# Patient Record
Sex: Female | Born: 1975
Health system: Southern US, Community
[De-identification: ages and names within clinical notes are randomized; demographics above are authoritative.]

## PROBLEM LIST (undated history)

## (undated) DIAGNOSIS — E119 Type 2 diabetes mellitus without complications: Secondary | ICD-10-CM

## (undated) DIAGNOSIS — J45909 Unspecified asthma, uncomplicated: Secondary | ICD-10-CM

## (undated) DIAGNOSIS — K219 Gastro-esophageal reflux disease without esophagitis: Secondary | ICD-10-CM

## (undated) DIAGNOSIS — M1990 Unspecified osteoarthritis, unspecified site: Secondary | ICD-10-CM

## (undated) DIAGNOSIS — Z87442 Personal history of urinary calculi: Secondary | ICD-10-CM

## (undated) DIAGNOSIS — E7800 Pure hypercholesterolemia, unspecified: Secondary | ICD-10-CM

## (undated) DIAGNOSIS — E039 Hypothyroidism, unspecified: Secondary | ICD-10-CM

## (undated) HISTORY — PX: CHOLECYSTECTOMY: SHX55

## (undated) HISTORY — PX: WISDOM TOOTH EXTRACTION: SHX21

---

## 1998-11-26 ENCOUNTER — Encounter: Admission: RE | Admit: 1998-11-26 | Discharge: 1998-12-18 | Payer: Self-pay | Admitting: Family Medicine

## 1999-07-25 ENCOUNTER — Other Ambulatory Visit: Admission: RE | Admit: 1999-07-25 | Discharge: 1999-07-25 | Payer: Self-pay | Admitting: Internal Medicine

## 2000-08-24 ENCOUNTER — Other Ambulatory Visit: Admission: RE | Admit: 2000-08-24 | Discharge: 2000-08-24 | Payer: Self-pay | Admitting: Obstetrics and Gynecology

## 2000-09-07 ENCOUNTER — Encounter: Payer: Self-pay | Admitting: Obstetrics and Gynecology

## 2000-09-07 ENCOUNTER — Ambulatory Visit (HOSPITAL_COMMUNITY): Admission: RE | Admit: 2000-09-07 | Discharge: 2000-09-07 | Payer: Self-pay | Admitting: Obstetrics and Gynecology

## 2000-10-07 ENCOUNTER — Encounter: Payer: Self-pay | Admitting: Obstetrics and Gynecology

## 2000-10-07 ENCOUNTER — Ambulatory Visit (HOSPITAL_COMMUNITY): Admission: RE | Admit: 2000-10-07 | Discharge: 2000-10-07 | Payer: Self-pay | Admitting: *Deleted

## 2000-10-26 ENCOUNTER — Ambulatory Visit (HOSPITAL_COMMUNITY): Admission: RE | Admit: 2000-10-26 | Discharge: 2000-10-26 | Payer: Self-pay | Admitting: Obstetrics and Gynecology

## 2000-12-04 ENCOUNTER — Inpatient Hospital Stay (HOSPITAL_COMMUNITY): Admission: AD | Admit: 2000-12-04 | Discharge: 2000-12-04 | Payer: Self-pay | Admitting: Obstetrics and Gynecology

## 2001-01-30 ENCOUNTER — Inpatient Hospital Stay (HOSPITAL_COMMUNITY): Admission: AD | Admit: 2001-01-30 | Discharge: 2001-02-01 | Payer: Self-pay | Admitting: Obstetrics and Gynecology

## 2001-06-10 ENCOUNTER — Encounter (HOSPITAL_BASED_OUTPATIENT_CLINIC_OR_DEPARTMENT_OTHER): Payer: Self-pay | Admitting: General Surgery

## 2001-06-15 ENCOUNTER — Ambulatory Visit (HOSPITAL_COMMUNITY): Admission: RE | Admit: 2001-06-15 | Discharge: 2001-06-16 | Payer: Self-pay | Admitting: General Surgery

## 2001-06-15 ENCOUNTER — Encounter (HOSPITAL_BASED_OUTPATIENT_CLINIC_OR_DEPARTMENT_OTHER): Payer: Self-pay | Admitting: General Surgery

## 2001-06-15 ENCOUNTER — Encounter (INDEPENDENT_AMBULATORY_CARE_PROVIDER_SITE_OTHER): Payer: Self-pay | Admitting: *Deleted

## 2001-10-25 ENCOUNTER — Other Ambulatory Visit: Admission: RE | Admit: 2001-10-25 | Discharge: 2001-10-25 | Payer: Self-pay | Admitting: Obstetrics and Gynecology

## 2002-11-08 ENCOUNTER — Other Ambulatory Visit: Admission: RE | Admit: 2002-11-08 | Discharge: 2002-11-08 | Payer: Self-pay | Admitting: Obstetrics and Gynecology

## 2003-05-29 ENCOUNTER — Inpatient Hospital Stay (HOSPITAL_COMMUNITY): Admission: AD | Admit: 2003-05-29 | Discharge: 2003-05-29 | Payer: Self-pay | Admitting: Obstetrics and Gynecology

## 2003-08-04 ENCOUNTER — Inpatient Hospital Stay (HOSPITAL_COMMUNITY): Admission: AD | Admit: 2003-08-04 | Discharge: 2003-08-06 | Payer: Self-pay | Admitting: Obstetrics and Gynecology

## 2003-11-13 ENCOUNTER — Other Ambulatory Visit: Admission: RE | Admit: 2003-11-13 | Discharge: 2003-11-13 | Payer: Self-pay | Admitting: Obstetrics and Gynecology

## 2004-09-24 ENCOUNTER — Inpatient Hospital Stay (HOSPITAL_COMMUNITY): Admission: AD | Admit: 2004-09-24 | Discharge: 2004-09-24 | Payer: Self-pay | Admitting: Obstetrics and Gynecology

## 2005-03-14 ENCOUNTER — Other Ambulatory Visit: Admission: RE | Admit: 2005-03-14 | Discharge: 2005-03-14 | Payer: Self-pay | Admitting: Family Medicine

## 2006-03-09 ENCOUNTER — Inpatient Hospital Stay (HOSPITAL_COMMUNITY): Admission: AD | Admit: 2006-03-09 | Discharge: 2006-03-09 | Payer: Self-pay | Admitting: Obstetrics and Gynecology

## 2007-03-24 ENCOUNTER — Inpatient Hospital Stay (HOSPITAL_COMMUNITY): Admission: AD | Admit: 2007-03-24 | Discharge: 2007-03-24 | Payer: Self-pay | Admitting: Obstetrics and Gynecology

## 2007-06-08 ENCOUNTER — Inpatient Hospital Stay (HOSPITAL_COMMUNITY): Admission: RE | Admit: 2007-06-08 | Discharge: 2007-06-10 | Payer: Self-pay | Admitting: Obstetrics and Gynecology

## 2010-06-04 NOTE — Discharge Summary (Signed)
Joan Walker, Joan Walker              ACCOUNT NO.:  [id number]   MEDICAL RECORD NO.:  [id number]          PATIENT TYPE:  INP   LOCATION:  9136                          FACILITY:  [facility]   PHYSICIAN:  Huel Cote, M.D. DATE OF BIRTH:  1976/01/04   DATE OF ADMISSION:  06/08/2007  DATE OF DISCHARGE:  06/10/2007                               DISCHARGE SUMMARY   DISCHARGE DIAGNOSES:  1. Term pregnancy at 39 plus weeks, delivered.  2. Status post normal spontaneous vaginal delivery.  3. Group B strep positive, treated in labor.   DISCHARGE MEDICATIONS:  Z-Pak for a prolonged sinus infection and Colace  stool softener.  The patient declined any pain medications.   DISCHARGE FOLLOWUP:  The patient is to follow up in 6 weeks for her  routine postpartum exam.   HOSPITAL COURSE:  The patient is a 35 year old G5, P2-0-2-2, who was  admitted at 20 plus weeks' gestation for induction of labor given term  status and favorable cervix.  Prenatal care had been uneventful except  for positive group B strep in her urine.   PRENATAL LABS:  O negative, antibody negative, rubella immune, hepatitis  B surface antigen negative, HIV negative, GC negative, chlamydia  negative, group B strep positive, 1-hour glucola 157, 3-hour within  normal limits.  First trimester screen was normal.   PAST OBSTETRICAL HISTORY:  1. In 2003, she had a 7-pound 6-ounce infant at 38 weeks.  2. In 2005, she had a 5-pound 10-ounce infant at 38 weeks.  3. In 2007, she had a miscarriage.  4. In 2008, she had a miscarriage.   PAST GYN HISTORY:  None.   PAST SURGICAL HISTORY:  Cholecystectomy in 2003.   PAST MEDICAL HISTORY:  None.   ALLERGIES:  None.   On admission, she was afebrile with stable vital signs.  Fetal heart  rate was reactive.  Cervix was 50, 3, in a -2 station.  She had a  rupture of membranes performed after she had received her penicillin  group B strep prophylaxis and was on Pitocin.  She progressed well  and  reached complete dilation and pushed for the normal spontaneous vaginal  delivery of a vigorous female infant OP.  Apgars were 8 and 9.  Weight was  7 pounds 11 ounces.  Placenta was delivered spontaneously.  There was a  true knot in the cord.  Small first-degree laceration was hemostatic and  did not require repair.  She was then admitted for routine postpartum  care.  On postpartum day #1, she complained of an increase in her sinus  infection symptoms with an excess of a green discharge and face and head  pain.  She had been treated with penicillin on her labor; however, was  placed on a Z-Pak to cover more atypical community-  acquired organisms for her sinus infection.  She was given prescriptions  for Motrin and Percocet prior to discharge, however, had declined pain  medications during her stay.  Postpartum day#2, she was feeling quite  well and was only with some fatigue.  Sinus infection was improving on Z-  Pak,  and she was discharged to home on those medications.      Huel Cote, M.D.  Electronically Signed     KR/MEDQ  D:  06/10/2007  T:  06/10/2007  Job:  [id number]

## 2010-06-07 NOTE — Discharge Summary (Signed)
[facility]  Patient:    Joan [name], Joan [name] Visit Number: [id number] MRN: [id number]          Service Type: OBS Location: 910A 9138 01 Attending Physician:  Oliver Pila Dictated by:   Alvino Chapel, M.D. Admit Date:  01/30/2001 Discharge Date: 02/01/2001                             Discharge Summary  DISCHARGE DIAGNOSES:          1. Term pregnancy at 38+ weeks.                               2. Smoker.                               3. Status post normal spontaneous vaginal                                  delivery.  DISCHARGE MEDICATIONS:        1. Motrin 600 mg p.o. q.6h. p.r.n.                               2. Percocet 1-2 tablets p.o. q.4h. p.r.n.  DISCHARGE FOLLOW-UP:          The patient is to follow up in approximately six weeks for her routine postpartum examination.  PRENATAL LABS:                O-, antibody negative.  RPR nonreactive. Rubella immune.  Hepatitis B surface antigen negative.  HIV negative.  GC negative.  Chlamydia negative.  Triple screen normal.  GBS negative.  PAST OBSTETRICAL HISTORY:     None.  PAST GYNECOLOGIC HISTORY:     None.  PAST SURGICAL HISTORY:        None.  PAST MEDICAL HISTORY:         None.  FAMILY HISTORY:               None.  HOSPITAL COURSE:              The patient is a 35 year old G1, P0 at 38+ weeks who is admitted with a advanced cervical dilation to 8 cm and contractions every 4-5 min.  The pregnancy has essentially been uncomplicated, except for positive smoking (which the patient had kept down substantially during her pregnancy).  The patient was afebrile on admission.  Fetal heart rate was reactive with occasional variables.   The cervix was completely effaced 8 cm and a 0 station.  She had assisted rupture of membranes with clear fluid obtained. She reached complete dilation shortly thereafter.  Pushed well with a normal spontaneous vaginal delivery of a vigorous female  infant over a second-degree laceration.  Apgars were 8 and 9.  Weight was 7 pounds 6 ounces.  Placenta then delivered spontaneously with a three-vessel cord.  Second-degree laceration was repaired with 2-0 Vicryl. Estimated blood loss was 350 cc.  The cervix and rectum were intact.  On postpartum day #2 the patient was doing very well.  She was afebrile with stable vital signs.  She was working on breast feeding, and was felt stable for discharge home. Dictated by:  Alvino Chapel, M.D. Attending Physician:  Oliver Pila DD:  02/01/01 TD:  02/01/01 Job: [id number]

## 2010-06-07 NOTE — Op Note (Signed)
[place]  Patient:    Joan [name], Joan [name] Visit Number: [id number] MRN: [id number]          Service Type: DSU Location: 5700 5702 01 Attending Physician:  Sonda Primes Dictated by:   Mardene Celeste. Lurene Shadow, M.D. Proc. Date: 06/15/01 Admit Date:  06/15/2001                             Operative Report  PREOPERATIVE DIAGNOSIS:  Chronic calculus cholecystitis.  POSTOPERATIVE DIAGNOSIS:  Chronic calculus cholecystitis.  OPERATION PERFORMED:  Laparoscopic cholecystectomy with intraoperative cholangiogram.  SURGEON:  Luisa Hart L. Lurene Shadow, M.D.  ASSISTANT:  Marnee Spring. Wiliam Ke, M.D.  ANESTHESIA:  General.  INDICATIONS FOR PROCEDURE:  This patient is a 35 year old woman with a three month history of recurrent epigastric pain, nausea and vomiting, easily exacerbated by fatty meals, no history of chills, fever or jaundice.  She had an abdominal ultrasound which showed multiple gallstones.  She was brought to the operating room now after the risks and potential benefits of laparoscopic gallbladder surgery have been fully discussed and she gives consent.  DESCRIPTION OF PROCEDURE:  Following the induction of satisfactory general anesthesia with the patient positioned supinely, the abdomen was routinely prepped and draped to be included in a sterile operative field.  Open laparoscopy created at the umbilicus with insertion of a Hasson cannula and insufflation of the peritoneal cavity to 14 mHg pressure using carbon dioxide. Visual exploration of the abdomen showed the gallbladder to be chronically scarred.  The liver edge was sharp.  Liver surface was smooth.  Some tethering of the duodenum up to the gallbladder.  None of the small or large intestine viewed appeared to be abnormal.  Pelvic organs were not visualized.  Under direct vision, epigastric and lateral ports were placed.  The gallbladder was then grasped and retracted cephalad.  Dissection  carried down in the region of the ampulla with isolation of the cystic artery and cystic duct.  The cystic artery traced to its entry into the gallbladder wall.  It was then doubly clipped and transected.  The cystic duct had multiple stones in it and these were milked out and then the cystic duct was clipped and opened.  Several more stones were milked out of the cystic duct.  I inserted a Reddick catheter into the abdomen and inserted it into the cystic duct and through it injected one half strength Hypaque and fluoroscoped the biliary system.  There was some spasm in the distal common bile duct at the sphincter. I gave the patient some glucagon and this relaxed the sphincter and contrast flowed freely into the duodenum. I did not see any filling defects within the common bile duct, common hepatic duct or upper hepatic radicals.  The common bile duct seemed to be very mildly dilated.  The Reddick catheter was then removed from the cystic duct.  The cystic duct was triply clipped and then transected.  I then dissected the gallbladder from the liver bed using electrocautery and maintaining hemostasis throughout the entire course of the dissection.  At the end of dissection, all areas were checked within the liver bed for bleeding.  Additional bleeding points were treated with electrocautery.  The camera was then placed in the epigastric port.  I retrieved the gallbladder through the umbilical port without difficulty. Sponge, instrument and sharp counts were verified.  The pneumoperitoneum allowed to deflate and the wounds closed in layers as  follows.  The epigastric and lateral flank wounds closed with 4-0 Dexon, the umbilical wound closed in two layers with 0 Dexon and 4-0 Dexon.  All wounds were then reinforced with Steri-Strips and sterile dressings were applied.  Anesthetic reversed. Patient removed from the operating room to the recovery room in stable condition having tolerated the  procedure well. Dictated by:   Mardene Celeste. Lurene Shadow, M.D. Attending Physician:  Sonda Primes DD:  06/15/01 TD:  06/15/01 Job: [id number]

## 2010-06-07 NOTE — Discharge Summary (Signed)
[name], [name]                        ACCOUNT NO.:  [id number]   MEDICAL RECORD NO.:  [id number]                   PATIENT TYPE:  INP   LOCATION:  9106                                 FACILITY:  [facility]   PHYSICIAN:  Malachi Pro. Ambrose Mantle, M.D.              DATE OF BIRTH:  [birth year]   DATE OF ADMISSION:  08/04/2003  DATE OF DISCHARGE:  08/06/2003                                 DISCHARGE SUMMARY   This 35 year old white married female, para 1-0-0-1, gravida 2, last period  November 16, 2002, [facility] August 24, 2003, by dates, but August 17, 2003, by  ultrasound, admitted with premature rupture of membranes. Blood group and  type O negative with a negative antibody, nonreactive serology, rubella  immune. Hepatitis B surface antigen negative. HIV declined. GC and Chlamydia  negative. Triple screen negative. One-hour Glucola 90. Group B strep  negative. Vagina ultrasound on January 19, 2003, crown rump length 3.1 cm,  10 weeks 0 days. [facility] August 17, 2003.  Urine infection with E. coli treated  with Macrobid on February 10, 2003. Repeat ultrasound on March 15, 2003,  revealed average gestational age 110 weeks 5 days, [facility] August 18, 2003.  The  patient stopped smoking on Jun 13, 2003, at 3;15 p.m.  On August 04, 2003, the  patient began leaking clear fluid. Exam in the office showed a positive  fern, cervix was 2 to 2+ cm, 40%, vertex, at a -2 to -3.   PAST MEDICAL HISTORY:  1. No known allergies.  2. Cholecystectomy in 2003.  3. No illnesses.   ALCOHOL, TOBACCO, AND DRUGS:  The patient stopped smoking after the onset of  pregnancy. She smoked less than one pack per day until she quit during the  pregnancy.   FAMILY HISTORY:  There is no significant family history in first-degree  relatives.   OBSTETRIC HISTORY:  In January 2003 a 7 pound 6 ounce female delivered  vaginally.   PHYSICAL EXAMINATION:  VITAL SIGNS: Normal.  HEART/LUNGS: Normal.  ABDOMEN: Soft. Fundal height 37 cm. Fetal heart  tones normal. Cervix 2 cm,  40%, fern positive, clear fluid, vertex at a -2 to -3 station.   Pitocin was begun and at 8 p.m. fetal heart rate decelerated. At  approximately 8:40 p.m. Pitocin was stopped, contractions ceased so Pitocin  was begun again at 2 milliunits a minute. By 9:10 p.m. the cervix was 2-3  cm, 50%, fetal scalp electrode applied. By 11:30 p.m. the cervix was 3+ cm,  60%, and I was called to see the patient for decelerations. At 12:30 a.m.  Pitocin was at 6 milliunits a minute with occasional deep variable  decelerations. Pitocin was diminished to 5 milliunits a minute and  decelerations improved. The cervix was 4+ cm and 70%. The patient declined  an epidural. She made good progress to an anterior lip. She could not  control her pushing urge and pushed uncontrollably  to deliver spontaneously  a second-degree left mediolateral laceration of a living female infant, 5  pounds 10 ounces with Apgars of 9 at 1 minute and 9 at 5 minutes. There was  a true knot in the cord, placenta was intact, uterus normal, rectum  negative. Left mediolateral laceration repaired with 3-0 Vicryl. Blood loss  about 400 cc. Postpartum the patient did well and was discharged on the  first postpartum day. The RhoGAM workup is in progress. Initial hemoglobin  12.3, hematocrit 36.7, white count 13,900, platelet count 305,000. Follow-up  hemoglobin 11.3. RPR was nonreactive.   FINAL DIAGNOSES:  1. Intrauterine pregnancy at 39 weeks, delivered, vertex.  2. Premature rupture of membranes.  3. True knot in the umbilical cord.   OPERATION:  Spontaneous delivery, vertex; repair of left mediolateral  laceration.   FINAL CONDITION:  Improved.   INSTRUCTIONS:  Regular discharge instruction booklet. The patient is advised  to return to the office in six weeks for follow-up examination. She declines  analgesics as discharge.                                               Malachi Pro. Ambrose Mantle, M.D.     TFH/MEDQ  D:  08/06/2003  T:  08/07/2003  Job:  [id number]

## 2010-10-14 LAB — RH IMMUNE GLOBULIN WORKUP (NOT WOMEN'S HOSP)
ABO/RH(D): O NEG
Antibody Screen: NEGATIVE

## 2010-10-16 LAB — CBC
HCT: 34 — ABNORMAL LOW
HCT: 35 — ABNORMAL LOW
Hemoglobin: 12
MCV: 88.6
Platelets: 228
Platelets: 240
RDW: 14.7
RDW: 15.3

## 2010-10-16 LAB — RH IMMUNE GLOB WKUP(>/=20WKS)(NOT WOMEN'S HOSP): Fetal Screen: POSITIVE

## 2010-10-16 LAB — KLEIHAUER-BETKE STAIN: Quantitation Fetal Hemoglobin: 0

## 2011-09-26 ENCOUNTER — Other Ambulatory Visit: Payer: Self-pay | Admitting: Family Medicine

## 2011-09-26 ENCOUNTER — Ambulatory Visit
Admission: RE | Admit: 2011-09-26 | Discharge: 2011-09-26 | Disposition: A | Discharge status: Home / Self Care | Payer: 59 | Source: Ambulatory Visit | Attending: Family Medicine | Admitting: Family Medicine

## 2011-09-26 DIAGNOSIS — R0600 Dyspnea, unspecified: Secondary | ICD-10-CM

## 2014-09-11 ENCOUNTER — Other Ambulatory Visit: Payer: Self-pay | Admitting: Physician Assistant

## 2014-09-11 ENCOUNTER — Other Ambulatory Visit (HOSPITAL_COMMUNITY)
Admission: RE | Admit: 2014-09-11 | Discharge: 2014-09-11 | Disposition: A | Discharge status: Home / Self Care | Payer: BLUE CROSS/BLUE SHIELD | Source: Ambulatory Visit | Attending: Family Medicine | Admitting: Family Medicine

## 2014-09-11 DIAGNOSIS — Z124 Encounter for screening for malignant neoplasm of cervix: Secondary | ICD-10-CM | POA: Insufficient documentation

## 2014-09-13 LAB — CYTOLOGY - PAP

## 2015-10-14 ENCOUNTER — Ambulatory Visit (HOSPITAL_COMMUNITY)
Admission: EM | Admit: 2015-10-14 | Discharge: 2015-10-14 | Disposition: A | Discharge status: Home / Self Care | Payer: Managed Care, Other (non HMO) | Attending: Internal Medicine | Admitting: Internal Medicine

## 2015-10-14 ENCOUNTER — Encounter (HOSPITAL_COMMUNITY): Payer: Self-pay | Admitting: *Deleted

## 2015-10-14 DIAGNOSIS — H6692 Otitis media, unspecified, left ear: Secondary | ICD-10-CM | POA: Diagnosis not present

## 2015-10-14 DIAGNOSIS — J019 Acute sinusitis, unspecified: Secondary | ICD-10-CM

## 2015-10-14 MED ORDER — PREDNISONE 50 MG PO TABS: 50.0000 mg | ORAL_TABLET | Freq: Every day | ORAL | 0 refills | Status: DC

## 2015-10-14 MED ORDER — AMOXICILLIN-POT CLAVULANATE 875-125 MG PO TABS: 1.0000 | ORAL_TABLET | Freq: Two times a day (BID) | ORAL | 0 refills | Status: AC

## 2015-10-14 NOTE — Discharge Instructions (Addendum)
Recheck or followup with primary care provider for further evaluation if symptoms persist.

## 2015-10-14 NOTE — ED Triage Notes (Signed)
C/O hoarse voice, productive cough since yesterday.  Had HA yesterday (now resolved).  Denies fevers.

## 2015-10-16 NOTE — ED Provider Notes (Signed)
[facility]    CSN: [id number] Arrival date & time: 10/14/15  [id number]     History   Chief Complaint Chief Complaint  Patient presents with  . Cough    HPI Jamison NeighborCheryl L Walker is a 40 y.o. female. Presents today with 2d hx hoarseness, sinus congestion/post nasal drainage.  Prod cough.  Runny nose.  Sore/scratchy throat, esp with talking.  Some nausea, no vomiting, no diarrhea.  Runny/congested nose x 1 month, before getting sicker 2d ago.  No fever.  Teaches, also works at a daycare.    HPI  History reviewed. No pertinent past medical history.  Past Surgical History:  Procedure Laterality Date  . CHOLECYSTECTOMY       Home Medications    Prior to Admission medications   Medication Sig Start Date End Date Taking? Authorizing Provider  levothyroxine (SYNTHROID, LEVOTHROID) 200 MCG tablet Take 200 mcg by mouth daily before breakfast.   Yes Historical Provider, MD    Family History No family history on file.  Social History Social History  Substance Use Topics  . Smoking status: Never Smoker  . Smokeless tobacco: Never Used  . Alcohol use No     Allergies   Review of patient's allergies indicates no known allergies.   Review of Systems Review of Systems  All other systems reviewed and are negative.    Physical Exam Triage Vital Signs ED Triage Vitals  Enc Vitals Group     BP 10/14/15 1955 121/85     Pulse Rate 10/14/15 1955 74     Resp 10/14/15 1955 16     Temp 10/14/15 1955 97.6 F (36.4 C)     Temp Source 10/14/15 1955 Oral     SpO2 10/14/15 1955 100 %     Weight --      Height --      Pain Score 10/14/15 2000 0   Updated Vital Signs BP 121/85   Pulse 74   Temp 97.6 F (36.4 C) (Oral)   Resp 16   LMP 09/16/2015 (Approximate)   SpO2 100%  Physical Exam  Constitutional: She is oriented to person, place, and time. No distress.  Alert, nicely groomed  HENT:  Head: Atraumatic.  Frequent coughing.  Voice is hoarse, also sounds quite  congested B TMs dull, no erythema on R; L erythematous Marked nasal congestion bilaterally Throat a little red  Eyes:  Conjugate gaze, no eye redness/drainage  Neck: Neck supple.  Cardiovascular: Normal rate and regular rhythm.   Pulmonary/Chest: No respiratory distress. She has no wheezes. She has no rales.  Lungs clear, symmetric breath sounds  Abdominal: She exhibits no distension.  Musculoskeletal: Normal range of motion.  Puffy/brawny 3-4+ symmetric edema B LEs, chronic per patient  Neurological: She is alert and oriented to person, place, and time.  Skin: Skin is warm and dry.  No cyanosis  Nursing note and vitals reviewed.    UC Treatments / Results   Procedures Procedures (including critical care time)      None today  Final Clinical Impressions(s) / UC Diagnoses   Final diagnoses:  Acute ear infection, left  Acute sinusitis with symptoms > 10 days   Recheck or followup with primary care provider for further evaluation if symptoms persist.    New Prescriptions Discharge Medication List as of 10/14/2015  8:23 PM    START taking these medications   Details  amoxicillin-clavulanate (AUGMENTIN) 875-125 MG tablet Take 1 tablet by mouth 2 (two) times daily., Starting Sun  10/14/2015, Until Wed 10/24/2015, Normal    predniSONE (DELTASONE) 50 MG tablet Take 1 tablet (50 mg total) by mouth daily., Starting Sun 10/14/2015, Normal         Joan Moore, MD 10/16/15 [phone]

## 2016-04-21 ENCOUNTER — Other Ambulatory Visit: Payer: Self-pay | Admitting: Family Medicine

## 2017-06-02 ENCOUNTER — Encounter: Payer: Self-pay | Admitting: Endocrinology

## 2017-06-02 ENCOUNTER — Ambulatory Visit (INDEPENDENT_AMBULATORY_CARE_PROVIDER_SITE_OTHER): Payer: Managed Care, Other (non HMO) | Admitting: Endocrinology

## 2017-06-02 VITALS — BP 144/88 | HR 88 | Ht 67.5 in | Wt 360.0 lb

## 2017-06-02 DIAGNOSIS — E039 Hypothyroidism, unspecified: Secondary | ICD-10-CM | POA: Insufficient documentation

## 2017-06-02 DIAGNOSIS — R5383 Other fatigue: Secondary | ICD-10-CM | POA: Diagnosis not present

## 2017-06-02 NOTE — Progress Notes (Signed)
Patient ID: Joan Walker, female   DOB: [birth year], 42 y.o.   MRN: [id number]           Referring Physician: Lupe Carney  Reason for Appointment:  Hypothyroidism, new visit    History of Present Illness:   Hypothyroidism was first diagnosed in ?  2015  At the time of diagnosis patient was getting a routine lab test for her annual exam but not having any symptoms of  fatigue, cold sensitivity, difficulty concentrating, dry skin, weight gain or hair loss.  Details of her prior treatments are not available except results pending 2017 when her thyroid supplement was that 150 mcg daily Subsequently her doses had been progressively increased and apparently in 2018 she was taking 200 mcg daily but no labs from 2018 are available  Although she was feeling relatively good in 04/2017 because of her TSH being 5.8 her dose was increased by 50 mcg  With increasing thyroid supplementation the does not still feel any different recently  She says that at times she will feel tired and periodically will have a day when she will feel tired and sleepy Also she does not have as much motivation to do do things or interact with people but does not think she is depressed  No change in her chronic dry skin She does have mild heat intolerance She has no change in her mild hair loss, not having any areas of alopecia She has had a gradual progressive weight gain over the last few years and is now at her maximum Probably has gained about 16 pounds over the last 2 years         Patient's weight history is as follows:  Wt Readings from Last 3 Encounters:  06/02/17 (!) 360 lb (163.3 kg)    Thyroid function results have been as follows:  Date   free T4   TSH   T3   04/02/2015   11.38   06/12/2015  6.02    08/10/2015    9.56    10/22/2015     4.99   04/23/2017   5.76      No results found for: TSH, FREET4, T3FREE   History reviewed. No pertinent past medical history.  Past Surgical History:    Procedure Laterality Date  . CHOLECYSTECTOMY      History reviewed. No pertinent family history.  Social History:  reports that she has never smoked. She has never used smokeless tobacco. She reports that she does not drink alcohol or use drugs.  Allergies: No Known Allergies  Allergies as of 06/02/2017   No Known Allergies     Medication List        Accurate as of 06/02/17  4:26 PM. Always use your most recent med list.          levothyroxine 200 MCG tablet Commonly known as:  SYNTHROID, LEVOTHROID Take 250 mcg by mouth daily before breakfast.          Review of Systems  Constitutional: Positive for weight gain.  HENT: Negative for hoarseness and trouble swallowing.   Respiratory: Positive for shortness of breath.   Cardiovascular: Positive for leg swelling.       She has had swelling of her legs and may be uncomfortable swelling right above her ankles.  Has not tolerated diuretics previously  Gastrointestinal: Negative for constipation.  Endocrine: Positive for heat intolerance. Negative for menstrual changes.       She has normal and regular menstrual cycles  Genitourinary: Positive for nocturia.  Musculoskeletal: Positive for joint pain. Negative for back pain.  Skin: Positive for dry skin.  Neurological: Negative for numbness.  Psychiatric/Behavioral: Negative for depressed mood and insomnia.                Examination:    BP (!) 144/88 (BP Location: Left Arm, Patient Position: Sitting, Cuff Size: Large)   Pulse 88   Ht 5' 7.5" (1.715 m)   Wt (!) 360 lb (163.3 kg)   SpO2 98%   BMI 55.55 kg/m   GENERAL:  Large build.   She has mild generalized swelling of her face with plethora especially of her cheeks  No buffalo hump, supraclavicular fat pads  No pallor, clubbing, lymphadenopathy in the neck  Skin:  no rash or pigmentation. No thinning of the skin, no ecchymosis No purple striae  EYES:  No prominence of the eyes or swelling of the  eyelids  ENT: Oral mucosa and tongue normal.  THYROID:  Not palpable.  HEART:  Normal  S1 and S2; no murmur or click.  CHEST:    Lungs: Vescicular breath sounds heard equally.  No crepitations/ wheeze.  ABDOMEN:  No distention.  Liver and spleen not palpable.  No other mass or tenderness.  NEUROLOGICAL: Reflexes are bilaterally normal at biceps.  JOINTS:  Normal.  EXTREMITIES: She has nonpitting swelling of her lower legs with significant amount of swelling just above the ankle with large fold of skin and stasis pigmentation   Assessment:  HYPOTHYROIDISM, diagnosed about 4 to 5 years ago and with no specific symptoms at baseline Baseline TSH not available for review  Although she has had a gradual increase in her levothyroxine dose since the diagnosis based on her lab work she normally does not have any subjective change in how she feels with dosage changes  For some time she is complaining of intermittent fatigue and lack of motivation which may or may not be related to her thyroid dysfunction No known history of depression but symptoms are suggestive of mild depression  With recent increase in her dose by 50 mcg her symptoms have not changed making it uncertain that she has these symptoms from hypothyroidism No objective findings on exam and no goiter found However discussed with the patient that occasionally with hypothyroidism especially with a low normal free T3 level patient may do subjectively better with the combination of liothyronine and levothyroxine  OBESITY: She has marked generalized obesity and has had a very gradual weight gain No clinical features of Cushing's disease on history or exam except for plethora of her face which is likely nonspecific  ?  Lymphedema of lower legs   PLAN:   Check free T4, free T3 and TSH levels today and decide on dosage regimen  Follow-up in 6 weeks   Reather Littler 06/02/2017, 4:26 PM   Consultation note copy sent to the  PCP  Note: This office note was prepared with Dragon voice recognition system technology. Any transcriptional errors that result from this process are unintentional.

## 2017-06-03 ENCOUNTER — Other Ambulatory Visit: Payer: Self-pay

## 2017-06-03 LAB — T4, FREE: FREE T4: 0.69 ng/dL (ref 0.60–1.60)

## 2017-06-03 LAB — TSH: TSH: 7.71 u[IU]/mL — AB (ref 0.35–4.50)

## 2017-06-03 LAB — T3, FREE: T3, Free: 4.1 pg/mL (ref 2.3–4.2)

## 2017-06-03 MED ORDER — LEVOTHYROXINE SODIUM 125 MCG PO TABS: 300.0000 ug | ORAL_TABLET | Freq: Every day | ORAL | 3 refills | Status: DC

## 2017-06-03 MED ORDER — LEVOTHYROXINE SODIUM 150 MCG PO TABS: ORAL_TABLET | ORAL | 3 refills | Status: DC

## 2017-06-03 MED ORDER — LEVOTHYROXINE SODIUM 150 MCG PO TABS: 300.0000 ug | ORAL_TABLET | Freq: Every day | ORAL | 3 refills | Status: DC

## 2017-06-04 ENCOUNTER — Other Ambulatory Visit: Payer: Self-pay

## 2017-07-16 ENCOUNTER — Encounter: Payer: Managed Care, Other (non HMO) | Attending: Family Medicine | Admitting: Skilled Nursing Facility1

## 2017-07-22 ENCOUNTER — Other Ambulatory Visit (INDEPENDENT_AMBULATORY_CARE_PROVIDER_SITE_OTHER): Payer: Managed Care, Other (non HMO)

## 2017-07-22 DIAGNOSIS — E039 Hypothyroidism, unspecified: Secondary | ICD-10-CM

## 2017-07-22 LAB — T4, FREE: Free T4: 1.03 ng/dL (ref 0.60–1.60)

## 2017-07-22 LAB — TSH: TSH: 3.81 u[IU]/mL (ref 0.35–4.50)

## 2017-07-22 LAB — T3, FREE: T3, Free: 4 pg/mL (ref 2.3–4.2)

## 2017-07-27 ENCOUNTER — Encounter: Payer: Self-pay | Admitting: Endocrinology

## 2017-07-27 ENCOUNTER — Ambulatory Visit (INDEPENDENT_AMBULATORY_CARE_PROVIDER_SITE_OTHER): Payer: Managed Care, Other (non HMO) | Admitting: Endocrinology

## 2017-07-27 VITALS — BP 136/88 | HR 86 | Ht 67.5 in | Wt 358.4 lb

## 2017-07-27 DIAGNOSIS — E039 Hypothyroidism, unspecified: Secondary | ICD-10-CM

## 2017-07-27 NOTE — Progress Notes (Signed)
Patient ID: Joan Walker, female   DOB: 01/25/1975, 42 y.o.   MRN: [id number]           Referring Physician: Lupe Carneyean Mitchell  Reason for Appointment:  Hypothyroidism, follow-up visit    History of Present Illness:   Hypothyroidism was first diagnosed in ?  2015  At the time of diagnosis patient was getting a routine lab test for her annual exam but not having any symptoms of  fatigue, cold sensitivity, difficulty concentrating, dry skin, weight gain or hair loss.  Details of her prior treatments are not available except results pending 2017 when her thyroid supplement was that 150 mcg daily Subsequently her doses had been progressively increased and apparently in 2018 she was taking 200 mcg daily but no labs from 2018 are available Other labs indicate persistently high TSH levels since 2017  RECENT history: TSH on her initial consultation was 7.7 when she was taking 250 mcg daily Although initially when she was seen she was complaining that times she will feel tired and periodically with increasing her dosage to 300 mcg levothyroxine she does not feel less tired and has better days more often  She is taking the supplement between 4-6 AM without any food when she gets up to go to the bathroom  She has had a gradual progressive weight gain over the last few years and this is now leveled off and she has lost 2 pounds         Patient's weight history is as follows:  Wt Readings from Last 3 Encounters:  07/27/17 (!) 358 lb 6.4 oz (162.6 kg)  06/02/17 (!) 360 lb (163.3 kg)    Thyroid function results have been as follows:  Date   free T4   TSH   T3   04/02/2015   11.38   06/12/2015  6.02    08/10/2015    9.56    10/22/2015     4.99   04/23/2017   5.76      Lab Results  Component Value Date   TSH 3.81 07/22/2017   TSH 7.71 (H) 06/02/2017   FREET4 1.03 07/22/2017   FREET4 0.69 06/02/2017   T3FREE 4.0 07/22/2017   T3FREE 4.1 06/02/2017     No past medical history on  file.  Past Surgical History:  Procedure Laterality Date  . CHOLECYSTECTOMY      No family history on file.  Social History:  reports that she has never smoked. She has never used smokeless tobacco. She reports that she does not drink alcohol or use drugs.  Allergies: No Known Allergies  Allergies as of 07/27/2017   No Known Allergies     Medication List        Accurate as of 07/27/17  3:34 PM. Always use your most recent med list.          levothyroxine 150 MCG tablet Commonly known as:  SYNTHROID, LEVOTHROID Take 2 tablets (300mcg) by mouth daily before breakfast, on an empty stomach.          Review of Systems  Cardiovascular: Positive for leg swelling.       Chronic nonpitting lower leg edema present                Examination:    BP 136/88 (BP Location: Left Arm, Patient Position: Sitting, Cuff Size: Large)   Pulse 86   Ht 5' 7.5" (1.715 m)   Wt (!) 358 lb 6.4 oz (162.6 kg)   SpO2  96%   BMI 55.30 kg/m      Assessment:  HYPOTHYROIDISM, diagnosed about 4 to 5 years ago and with no specific symptoms at baseline  Although she is requiring 300 mcg daily and progressively needing higher doses her TSH is now finally back to normal She subjectively feels better also She is very regular with taking a supplement as directed early morning   PLAN:   Continue 300 mcg daily of levothyroxine  Follow-up in 12 weeks   Joan Walker 07/27/2017, 3:34 PM    Note: This office note was prepared with Dragon voice recognition system technology. Any transcriptional errors that result from this process are unintentional.

## 2017-08-11 ENCOUNTER — Encounter: Payer: Self-pay | Admitting: Skilled Nursing Facility1

## 2017-08-11 ENCOUNTER — Encounter: Payer: Managed Care, Other (non HMO) | Attending: Family Medicine | Admitting: Skilled Nursing Facility1

## 2017-08-11 DIAGNOSIS — Z6841 Body Mass Index (BMI) 40.0 and over, adult: Secondary | ICD-10-CM | POA: Insufficient documentation

## 2017-08-11 DIAGNOSIS — Z713 Dietary counseling and surveillance: Secondary | ICD-10-CM | POA: Insufficient documentation

## 2017-08-11 DIAGNOSIS — E669 Obesity, unspecified: Secondary | ICD-10-CM | POA: Diagnosis present

## 2017-08-11 NOTE — Progress Notes (Signed)
Assessment:  Primary concerns today: obesity.   Pt arrived stating she doubts the dietitian can tell her anything she does not already know but we'll see. Pt states she has gained weight due to school and having a baby and no motivation. Pt states she has been big all of her life stating in high school her weight was 218 pounds. Pt states her first goal is to be 222 pounds.  Pt state she has 3 children and works full time working at a day care for [employer]. Pt states because she does not sit on her rear end all day she should not weigh this much. Pt states she urinates every 2-3 hours at night. Pt states she dribbles all the time needing panty liners. Pt states she has a bowel moment every day. Pt states every time she eats at [employer] she has lose stools. Pt states she wakes between 6-7:30am.  Pt states she always snacks for 2-3 days before her menses and 2-3 days after. Pt states she marinates her steak with butter. Pt states she finishes her childrens plates. Pt states she cooks all of her vegetables with butter. Pt states she typically eats in front of the television or with her phone.  Pt consistently changed the subject due to feeling uncomfortable with the subject. Pt was tearful throughout the appointment, at the end stating wow I do need a therapist my daughter came out as a pan sexual I love her but cannot support her being with a female, my son uses anger to get out of responsibility and my 50 year old son plays video games all day and my husband is supportive but also plays video games all day. 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 Pt states she already has a lot to think about so she will think about everything she learned today and then decide wether she is ready to work on changing her lifestyle habits and may see a therapist before starting that process.   MEDICATIONS: See List   DIETARY INTAKE:  Usual eating pattern includes 3 meals and 1 snacks per day.  Everyday  foods include none stated.  Avoided foods include peaches, mango, papaya, fish.    24-hr recall: eating out 5 days a week, 3 days a Week fried food B ( AM): fast food egg and cheese mcmuffin or sausge muffin or breakfast burrito (egg, sausage, cheese)----cereal  Snk ( AM): L ( PM): fish sticks or hamburger or chicken nuggets with green beans or broccoli with apple or other fruit----leftovers from dinner Snk ( PM): cheese and crackers  D ( PM): chicken fried or enchiladas or spinach and artichoke dip or pasta or sandwiches Snk ( PM):  Beverages: 96+oz water, juice   Usual physical activity: ADL's  Estimated energy needs: 1500 calories 170 g carbohydrates 112 g protein 42 g fat  Progress Towards Goal(s):  In progress.   Nutritional Diagnosis:  [place]-3.3 Overweight/obesity As related to overconsumption of calcories.  As evidenced by 24 hr recall: usse of butter in all cooking/fried foods multiple tiems a week and BMI 56.45.    Intervention:  Nutrition cousneling. Dietitian educated the pt on motivation to make changes. Goals: -Get with a urologist  -Find a mental health professional to work with -Eat every 3-5 hours: balanced meals: carbohydrate, protein, and vegetables  -Eat non starchy vegetables with every lunch and dinner 7 days a week -Work on awareness with the use of the should I eat flowsheet Teaching  Method Utilized:  Visual Auditory Hands on  Handouts given during visit include:  mental health professionals   -Should I eat  Barriers to learning/adherence to lifestyle change: emotional eating  Demonstrated degree of understanding via:  Teach Back   Monitoring/Evaluation:  Dietary intake, exercise, and body weight prn.

## 2017-10-22 ENCOUNTER — Other Ambulatory Visit (INDEPENDENT_AMBULATORY_CARE_PROVIDER_SITE_OTHER): Payer: Managed Care, Other (non HMO)

## 2017-10-22 ENCOUNTER — Other Ambulatory Visit: Payer: Managed Care, Other (non HMO)

## 2017-10-22 DIAGNOSIS — E039 Hypothyroidism, unspecified: Secondary | ICD-10-CM | POA: Diagnosis not present

## 2017-10-22 LAB — T4, FREE: FREE T4: 1.03 ng/dL (ref 0.60–1.60)

## 2017-10-22 LAB — TSH: TSH: 2.88 u[IU]/mL (ref 0.35–4.50)

## 2017-10-25 NOTE — Progress Notes (Deleted)
Patient ID: Joan Walker, female   DOB: 15-Nov-1975, 42 y.o.   MRN: [id number]           Referring Physician: Lupe Walker  Reason for Appointment:  Hypothyroidism, follow-up visit    History of Present Illness:   Hypothyroidism was first diagnosed in ?  2015  At the time of diagnosis patient was getting a routine lab test for her annual exam but not having any symptoms of  fatigue, cold sensitivity, difficulty concentrating, dry skin, weight gain or hair loss.  Details of her prior treatments are not available except results pending 2017 when her thyroid supplement was that 150 mcg daily Subsequently her doses had been progressively increased and apparently in 2018 she was taking 200 mcg daily but no labs from 2018 are available Other labs indicate persistently high TSH levels since 2017  RECENT history: TSH on her initial consultation was 7.7 when she was taking 250 mcg daily Although initially when she was seen she was complaining that times she will feel tired and periodically with increasing her dosage to 300 mcg levothyroxine she does not feel less tired and has better days more often  She is taking the supplement between 4-6 AM without any food when she gets up to go to the bathroom  She has had a gradual progressive weight gain over the last few years and this is now leveled off and she has lost 2 pounds         Patient's weight history is as follows:  Wt Readings from Last 3 Encounters:  08/11/17 (!) 360 lb 6.4 oz (163.5 kg)  07/27/17 (!) 358 lb 6.4 oz (162.6 kg)  06/02/17 (!) 360 lb (163.3 kg)    Thyroid function results have been as follows:  Date   free T4   TSH   T3   04/02/2015   11.38   06/12/2015  6.02    08/10/2015    9.56    10/22/2015     4.99   04/23/2017   5.76      Lab Results  Component Value Date   TSH 2.88 10/22/2017   TSH 3.81 07/22/2017   TSH 7.71 (H) 06/02/2017   FREET4 1.03 10/22/2017   FREET4 1.03 07/22/2017   FREET4 0.69 06/02/2017   T3FREE 4.0 07/22/2017   T3FREE 4.1 06/02/2017     No past medical history on file.  Past Surgical History:  Procedure Laterality Date  . CHOLECYSTECTOMY      No family history on file.  Social History:  reports that she has never smoked. She has never used smokeless tobacco. She reports that she does not drink alcohol or use drugs.  Allergies: No Known Allergies  Allergies as of 10/26/2017   No Known Allergies     Medication List        Accurate as of 10/25/17  9:39 PM. Always use your most recent med list.          levothyroxine 150 MCG tablet Commonly known as:  SYNTHROID, LEVOTHROID Take 2 tablets ( ) by mouth daily before breakfast, on an empty stomach.          Review of Systems              Examination:    There were no vitals taken for this visit.     Assessment:  HYPOTHYROIDISM, diagnosed about 4 to 5 years ago and with no specific symptoms at baseline  Although she is requiring 300 mcg daily and  progressively needing higher doses her TSH is now finally back to normal She subjectively feels better also She is very regular with taking a supplement as directed early morning   PLAN:   Continue 300 mcg daily of levothyroxine  Follow-up in 12 weeks   Joan Walker 10/25/2017, 9:39 PM    Note: This office note was prepared with Dragon voice recognition system technology. Any transcriptional errors that result from this process are unintentional.

## 2017-10-26 ENCOUNTER — Ambulatory Visit: Payer: Managed Care, Other (non HMO) | Admitting: Endocrinology

## 2017-10-26 DIAGNOSIS — Z0289 Encounter for other administrative examinations: Secondary | ICD-10-CM

## 2017-10-28 ENCOUNTER — Encounter: Payer: Self-pay | Admitting: Endocrinology

## 2017-10-28 ENCOUNTER — Ambulatory Visit (INDEPENDENT_AMBULATORY_CARE_PROVIDER_SITE_OTHER): Payer: Managed Care, Other (non HMO) | Admitting: Endocrinology

## 2017-10-28 ENCOUNTER — Ambulatory Visit: Payer: Managed Care, Other (non HMO) | Admitting: Endocrinology

## 2017-10-28 VITALS — BP 128/82 | HR 80 | Ht 67.0 in | Wt 354.0 lb

## 2017-10-28 DIAGNOSIS — E039 Hypothyroidism, unspecified: Secondary | ICD-10-CM | POA: Diagnosis not present

## 2017-10-28 MED ORDER — LEVOTHYROXINE SODIUM 175 MCG PO TABS: ORAL_TABLET | ORAL | 2 refills | Status: DC

## 2017-10-28 NOTE — Progress Notes (Signed)
Patient ID: Joan Walker, female   DOB: [birth year], 42 y.o.   MRN: [id number]           Referring Physician: Lupe Carney  Reason for Appointment:  Hypothyroidism, follow-up visit    History of Present Illness:   Hypothyroidism was first diagnosed in ?  2015  At the time of diagnosis patient was getting a routine lab test for her annual exam but not having any symptoms of  fatigue, cold sensitivity, difficulty concentrating, dry skin, weight gain or hair loss.  Details of her prior treatments are not available except results pending 2017 when her thyroid supplement was that 150 mcg daily Subsequently her doses had been progressively increased and apparently in 2018 she was taking 200 mcg daily but no labs from 2018 are available Other labs indicate persistently high TSH levels since 2017  RECENT history: TSH on her initial consultation 5/19 was 7.7 when she was taking 250 mcg daily At that time she was complaining of feeling tired periodically  After increasing her dosage to 300 mcg levothyroxine she does not feel as tired Again since her last visit in July she has generally felt fairly good He does have mild hair loss which is not new Has had slight improvement in weight recently  She is taking the supplement between 4-6 AM without any food when she gets up to go to the bathroom          Patient's weight history is as follows:  Wt Readings from Last 3 Encounters:  10/28/17 (!) 354 lb (160.6 kg)  08/11/17 (!) 360 lb 6.4 oz (163.5 kg)  07/27/17 (!) 358 lb 6.4 oz (162.6 kg)    Thyroid function results have been as follows:    Lab Results  Component Value Date   TSH 2.88 10/22/2017   TSH 3.81 07/22/2017   TSH 7.71 (H) 06/02/2017   FREET4 1.03 10/22/2017   FREET4 1.03 07/22/2017   FREET4 0.69 06/02/2017   T3FREE 4.0 07/22/2017   T3FREE 4.1 06/02/2017   Date   free T4   TSH   T3   04/02/2015   11.38   06/12/2015  6.02    08/10/2015    9.56    10/22/2015     4.99     04/23/2017   5.76    No past medical history on file.  Past Surgical History:  Procedure Laterality Date  . CHOLECYSTECTOMY      No family history on file.  Social History:  reports that she has never smoked. She has never used smokeless tobacco. She reports that she does not drink alcohol or use drugs.  Allergies: No Known Allergies  Allergies as of 10/28/2017   No Known Allergies     Medication List        Accurate as of 10/28/17 11:02 AM. Always use your most recent med list.          levothyroxine 150 MCG tablet Commonly known as:  SYNTHROID, LEVOTHROID Take 2 tablets ( ) by mouth daily before breakfast, on an empty stomach.          Review of Systems  Cardiovascular: Positive for leg swelling.       Chronic nonpitting lower leg edema about the same, does not think it is as firm in the lower leg                Examination:    BP 128/82   Pulse 80   Ht 5\' 7"  (1.702  m)   Wt (!) 354 lb (160.6 kg)   SpO2 98%   BMI 55.44 kg/m   No thyroid enlargement present   Assessment:  HYPOTHYROIDISM, diagnosed in 2014 also and with no specific symptoms at baseline  Her hypothyroidism appears to be well controlled with supplementing using 300 mcg daily She is quite compliant with this in the mornings Also her TSH is now consistent 2 times in a row With this she has had less fatigue and is able to control her weight better   PLAN:   Continue 300 mcg daily of levothyroxine Patient wants to use of her supply of 125 mcg at home and will give her a temporary supply of 175 also Subsequently she can go to a 300 mcg tablet once daily  Follow-up in 6 months   Kyleena Scheirer 10/28/2017, 11:02 AM    Note: This office note was prepared with Insurance underwriter. Any transcriptional errors that result from this process are unintentional.

## 2018-02-02 ENCOUNTER — Other Ambulatory Visit: Payer: Self-pay | Admitting: Endocrinology

## 2018-02-27 ENCOUNTER — Other Ambulatory Visit: Payer: Self-pay | Admitting: Endocrinology

## 2018-02-27 MED ORDER — LEVOTHYROXINE SODIUM 300 MCG PO TABS: 300.0000 ug | ORAL_TABLET | Freq: Every day | ORAL | 2 refills | Status: DC

## 2018-02-28 DIAGNOSIS — J029 Acute pharyngitis, unspecified: Secondary | ICD-10-CM | POA: Diagnosis not present

## 2018-02-28 DIAGNOSIS — R6889 Other general symptoms and signs: Secondary | ICD-10-CM | POA: Diagnosis not present

## 2018-02-28 DIAGNOSIS — R509 Fever, unspecified: Secondary | ICD-10-CM | POA: Diagnosis not present

## 2018-03-02 ENCOUNTER — Telehealth: Payer: Self-pay | Admitting: Endocrinology

## 2018-03-02 NOTE — Telephone Encounter (Signed)
LMTCB and schedule follow up appointment with Dr Lucianne MussKumar for her Thyroid with labs prior to visit  - per Dr Lucianne MussKumar request

## 2018-03-15 DIAGNOSIS — R0600 Dyspnea, unspecified: Secondary | ICD-10-CM | POA: Diagnosis not present

## 2018-03-15 DIAGNOSIS — M545 Low back pain: Secondary | ICD-10-CM | POA: Diagnosis not present

## 2018-03-15 DIAGNOSIS — N3000 Acute cystitis without hematuria: Secondary | ICD-10-CM | POA: Diagnosis not present

## 2018-04-06 ENCOUNTER — Other Ambulatory Visit: Payer: Self-pay

## 2018-04-06 ENCOUNTER — Other Ambulatory Visit (INDEPENDENT_AMBULATORY_CARE_PROVIDER_SITE_OTHER): Payer: BLUE CROSS/BLUE SHIELD

## 2018-04-06 DIAGNOSIS — E039 Hypothyroidism, unspecified: Secondary | ICD-10-CM | POA: Diagnosis not present

## 2018-04-06 LAB — TSH: TSH: 2.37 u[IU]/mL (ref 0.35–4.50)

## 2018-04-06 LAB — T4, FREE: Free T4: 1.3 ng/dL (ref 0.60–1.60)

## 2018-04-12 ENCOUNTER — Telehealth: Payer: Self-pay | Admitting: Endocrinology

## 2018-04-12 NOTE — Telephone Encounter (Signed)
Webex is still not working properly and cannot be scheduled at this time.

## 2018-04-12 NOTE — Telephone Encounter (Signed)
Patient stated she wasn't sure if she really needed to come in on Friday for an appt where she would just be told "her thyroid is normal"  She wanted to see if there was a phone option and I informed patient of Telehealth that would be live at the end of this week. She asked if she could do both or if Dr Lucianne Muss really even needed to speak or see her  Please advise

## 2018-04-12 NOTE — Telephone Encounter (Signed)
Please advise 

## 2018-04-12 NOTE — Telephone Encounter (Signed)
Please schedule virtual visit if she can do WebEx as this can be a 2 way conversation

## 2018-04-13 ENCOUNTER — Telehealth: Payer: BLUE CROSS/BLUE SHIELD | Admitting: Endocrinology

## 2018-04-13 ENCOUNTER — Ambulatory Visit: Payer: BLUE CROSS/BLUE SHIELD | Admitting: Endocrinology

## 2018-04-15 ENCOUNTER — Encounter: Payer: Self-pay | Admitting: Endocrinology

## 2018-04-15 ENCOUNTER — Other Ambulatory Visit: Payer: Self-pay

## 2018-04-15 ENCOUNTER — Ambulatory Visit (INDEPENDENT_AMBULATORY_CARE_PROVIDER_SITE_OTHER): Payer: BLUE CROSS/BLUE SHIELD | Admitting: Endocrinology

## 2018-04-15 DIAGNOSIS — E039 Hypothyroidism, unspecified: Secondary | ICD-10-CM

## 2018-04-15 MED ORDER — LEVOTHYROXINE SODIUM 300 MCG PO TABS: 300.0000 ug | ORAL_TABLET | Freq: Every day | ORAL | 2 refills | Status: DC

## 2018-04-15 NOTE — Progress Notes (Addendum)
Patient ID: Joan Walker, female   DOB: 01/09/1976, 43 y.o.   MRN: [id number]           Referring Physician: Lupe Carney  Reason for Appointment:  Hypothyroidism, follow-up visit   Today's office visit was provided via telemedicine using video technique Explained to the patient and the the limitations of evaluation and management by telemedicine and the availability of in person appointments.  The patient understood the limitations and agreed to proceed. Patient also understood that the telehealth visit is billable. . Location of the patient: Home . Location of the provider: Office Only the patient and myself were participating in the encounter      History of Present Illness:   Hypothyroidism was first diagnosed in ?  2015  At the time of diagnosis patient was getting a routine lab test for her annual exam but not having any symptoms of  fatigue, cold sensitivity, difficulty concentrating, dry skin, weight gain or hair loss.  Details of her prior treatments are not available except results pending 2017 when her thyroid supplement was that 150 mcg daily Subsequently her doses had been progressively increased and apparently in 2018 she was taking 200 mcg daily but no labs from 2018 are available Other labs indicate persistently high TSH levels since 2017  RECENT history: TSH on her initial consultation 5/19 was 7.7 when she was taking 250 mcg daily At that time she was complaining of feeling tired periodically  Since increasing her dosage to 300 mcg levothyroxine she does not feel as tired  She says her energy level is generally fairly good now She does not know if she has gained any weight recently No cold intolerance Has had long-term mild hair loss which is basically shedding in the shower only Does not feel she has any change in her swelling of the fingers or legs  She is taking the levothyroxine supplement between 4-6 AM without any food and no new calcium or iron  supplements  Her TSH has been consistently normal since 7/19         Patient's weight history is as follows:  Wt Readings from Last 3 Encounters:  10/28/17 (!) 354 lb (160.6 kg)  08/11/17 (!) 360 lb 6.4 oz (163.5 kg)  07/27/17 (!) 358 lb 6.4 oz (162.6 kg)    Thyroid function results have been as follows:    Lab Results  Component Value Date   TSH 2.37 04/06/2018   TSH 2.88 10/22/2017   TSH 3.81 07/22/2017   TSH 7.71 (H) 06/02/2017   FREET4 1.30 04/06/2018   FREET4 1.03 10/22/2017   FREET4 1.03 07/22/2017   FREET4 0.69 06/02/2017   T3FREE 4.0 07/22/2017   T3FREE 4.1 06/02/2017   Date   free T4   TSH   T3   04/02/2015   11.38   06/12/2015  6.02    08/10/2015    9.56    10/22/2015     4.99   04/23/2017   5.76    History reviewed. No pertinent past medical history.  Past Surgical History:  Procedure Laterality Date  . CHOLECYSTECTOMY      History reviewed. No pertinent family history.  Social History:  reports that she has never smoked. She has never used smokeless tobacco. She reports that she does not drink alcohol or use drugs.  Allergies: No Known Allergies  Allergies as of 04/15/2018   No Known Allergies     Medication List       Accurate as  of April 15, 2018 11:59 PM. Always use your most recent med list.        levothyroxine 300 MCG tablet Commonly known as:  SYNTHROID, LEVOTHROID Take 1 tablet (300 mcg total) by mouth daily before breakfast.          Review of Systems       Mild generalized puffiness of her face without edema of the eyes present        Examination:    There were no vitals taken for this visit.    Assessment:  HYPOTHYROIDISM, diagnosed in 2014 also and with no specific symptoms at baseline  Her hypothyroidism has been consistently well controlled with 300 mcg levothyroxine daily She is quite compliant with this and taking it with water before breakfast in the morning Subjectively she feels fairly good Not clear if she  has had any weight fluctuation recently With this dose her TSH has been normal since 7/19   PLAN:   Continue 300 mcg daily of levothyroxine She will continue the same dose and reminded her not to take any calcium or iron-containing vitamins at the same time  Follow-up in 6 months   Reather Littler 04/16/2018, 4:13 PM    Note: This office note was prepared with Dragon voice recognition system technology. Any transcriptional errors that result from this process are unintentional.

## 2018-04-16 ENCOUNTER — Ambulatory Visit: Payer: Managed Care, Other (non HMO) | Admitting: Endocrinology

## 2018-04-26 DIAGNOSIS — M545 Low back pain: Secondary | ICD-10-CM | POA: Diagnosis not present

## 2018-04-26 DIAGNOSIS — R8290 Unspecified abnormal findings in urine: Secondary | ICD-10-CM | POA: Diagnosis not present

## 2018-05-26 DIAGNOSIS — L259 Unspecified contact dermatitis, unspecified cause: Secondary | ICD-10-CM | POA: Diagnosis not present

## 2018-06-08 DIAGNOSIS — L509 Urticaria, unspecified: Secondary | ICD-10-CM | POA: Diagnosis not present

## 2018-06-08 DIAGNOSIS — L239 Allergic contact dermatitis, unspecified cause: Secondary | ICD-10-CM | POA: Diagnosis not present

## 2018-07-02 DIAGNOSIS — Z0000 Encounter for general adult medical examination without abnormal findings: Secondary | ICD-10-CM | POA: Diagnosis not present

## 2018-07-09 DIAGNOSIS — E7800 Pure hypercholesterolemia, unspecified: Secondary | ICD-10-CM | POA: Diagnosis not present

## 2018-07-29 DIAGNOSIS — H1013 Acute atopic conjunctivitis, bilateral: Secondary | ICD-10-CM | POA: Diagnosis not present

## 2018-08-13 DIAGNOSIS — R7309 Other abnormal glucose: Secondary | ICD-10-CM | POA: Diagnosis not present

## 2018-08-13 DIAGNOSIS — R21 Rash and other nonspecific skin eruption: Secondary | ICD-10-CM | POA: Diagnosis not present

## 2018-09-10 DIAGNOSIS — E7800 Pure hypercholesterolemia, unspecified: Secondary | ICD-10-CM | POA: Diagnosis not present

## 2018-09-10 DIAGNOSIS — E1165 Type 2 diabetes mellitus with hyperglycemia: Secondary | ICD-10-CM | POA: Diagnosis not present

## 2018-10-07 ENCOUNTER — Other Ambulatory Visit: Payer: Self-pay

## 2018-10-07 ENCOUNTER — Encounter: Payer: BC Managed Care – PPO | Attending: Family Medicine | Admitting: Registered"

## 2018-10-07 ENCOUNTER — Encounter: Payer: Self-pay | Admitting: Registered"

## 2018-10-07 DIAGNOSIS — E119 Type 2 diabetes mellitus without complications: Secondary | ICD-10-CM | POA: Diagnosis not present

## 2018-10-07 NOTE — Patient Instructions (Addendum)
When calling Dr. Ronnie Derby office let them know you have an A1c of greater than 10% Some things to consider talking about: Hypoglycemia unawareness. You may not have symptoms when your blood sugar is low. Have your husband review the medication and ADA book and go to your doctor's appointment.  Stress management: Consider spending time now for stress management strategies for when the students come back into the class room. Remember to breathe.  Continue choosing beverages that are not sweetened.

## 2018-10-07 NOTE — Progress Notes (Signed)
Diabetes Self-Management Education  Visit Type: First/Initial  Appt. Start Time: 1530 Appt. End Time: 1650  10/07/2018  Ms. Fontaine Noheryl Vancleve, identified by name and date of birth, is a 43 y.o. female with a diagnosis of Diabetes: Type 2.   ASSESSMENT  There were no vitals taken for this visit. There is no height or weight on file to calculate BMI.   SMBG: patient was checking FBG daily had meter syncing to phone, but it's not syncing now. Patient states ~2 weeks averaging 215 mg/dL. Pt states she checked one PPBG which was over 300 mg/dL.  DM Medications: Pt states she is taking 2000 mg of metformin.  Diet: Pt states she doesn't eat dessert. Pt states what "kills me is pasta & pizza, subs." Pt states she used to drink a lot of coke zero but gave it up about a year ago, doesn't drink reg soda. Pt states she used to drink a lot of juice but now may have 4 oz juice instead of a piece of fruit at breakfast. Pt states she knows what to do with diet.  Physical Activity: uses 15 min break to walk school hallways, may do it 2x day if right knee doesn't hurt too much. Pt wants to get stationery bike but can afford now. Pt thought about buying a street bike to ride in the neighborhood with her kids, but one that would support her weight was $700. Pt states when knee is hurting orders groceries for pick up.  Sleep: getting better now that she started DM medications. Prior to that for ~3 yrs she was waking up 4-5x per night to urinate.  Stress: 2/10 now because school is virtual and she is done by shortly after 2 pm. 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 Pt states this is the 3rd year she has been FT at this position. 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  Pt states she has watched her father deal with his DM and now that she has been diagnosed had concerns about the future of her kidneys because her father has to watch  is potassium d/t decreased kidney function. RD reviewed with patient her MD notes regarding medications that are prescribed as preventative measures.  Although she has a family history of T2DM, patient states she was somewhat surprised by diagnosis because a few years ago she was on a road trip eating a lot of junk food and stopped at her grandmother's house and used her glucometer ~2 hrs after eating and BG was 70. Pt states she did not have low BG symptoms and remembers her grandmother being concerned about her reading and gave her juice to bring it up.   Diabetes Self-Management Education - 10/07/18 1526      Visit Information   Visit Type  First/Initial      Initial Visit   Diabetes Type  Type 2    Are you currently following a meal plan?  No    Are you taking your medications as prescribed?  Yes    Date Diagnosed  aug 2020      Health Coping   How would you rate your overall health?  Fair      Psychosocial Assessment   Patient Belief/Attitude about Diabetes  Other (comment)   meh   How often do you need to have someone help you when you read instructions, pamphlets, or other written materials from  your doctor or pharmacy?  2 - Rarely    What is the last grade level you completed in school?  bachelors degree      Complications   Last HgB A1C per patient/outside source  10.4 %   per referral   How often do you check your blood sugar?  1-2 times/day    Have you had a dilated eye exam in the past 12 months?  Yes    Have you had a dental exam in the past 12 months?  Yes    Are you checking your feet?  No      Dietary Intake   Breakfast  cereal OR eggs, toast or tortilla, hashbrowns OR burrito    Snack (morning)  none    Lunch  soup OR chili & oranges    Snack (afternoon)  nuts OR popcorn OR PB crackers OR craisins    Dinner  manicoti OR potroast, veggies OR fried chicken, mashed potatoes    Snack (evening)  not usually or popcorn    Beverage(s)  water, 4 oz juice with  breakfast, flavored water      Exercise   Exercise Type  Light (walking / raking leaves)    How many days per week to you exercise?  5    How many minutes per day do you exercise?  10    Total minutes per week of exercise  50      Patient Education   Previous Diabetes Education  No    Disease state   Definition of diabetes, type 1 and 2, and the diagnosis of diabetes    Nutrition management   Role of diet in the treatment of diabetes and the relationship between the three main macronutrients and blood glucose level;Reviewed blood glucose goals for pre and post meals and how to evaluate the patients' food intake on their blood glucose level.    Medications  Reviewed patients medication for diabetes, action, purpose, timing of dose and side effects.    Psychosocial adjustment  Role of stress on diabetes      Individualized Goals (developed by patient)   Nutrition  General guidelines for healthy choices and portions discussed    Medications  Other (comment)   make appt with endocrinologist     Outcomes   Expected Outcomes  Demonstrated interest in learning. Expect positive outcomes    Future DMSE  PRN    Program Status  Completed       Individualized Plan for Diabetes Self-Management Training:   Learning Objective:  Patient will have a greater understanding of diabetes self-management. Patient education plan is to attend individual and/or group sessions per assessed needs and concerns.   Patient Instructions  When calling Dr. Ronnie Derby office let them know you have an A1c of greater than 10% Some things to consider talking about: Hypoglycemia unawareness. You may not have symptoms when your blood sugar is low. Have your husband review the medication and ADA book and go to your doctor's appointment.  Stress management: Consider spending time now for stress management strategies for when the students come back into the class room. Remember to breathe.  Continue choosing beverages  that are not sweetened.  Expected Outcomes:  Demonstrated interest in learning. Expect positive outcomes  Education material provided: ADA - How to Thrive: A Guide for Your Journey with Diabetes and A1C conversion sheet, medication chart.  If problems or questions, patient to contact team via:  Phone and MyChart  Future DSME appointment: PRN

## 2018-10-08 DIAGNOSIS — R350 Frequency of micturition: Secondary | ICD-10-CM | POA: Diagnosis not present

## 2018-10-14 ENCOUNTER — Other Ambulatory Visit (INDEPENDENT_AMBULATORY_CARE_PROVIDER_SITE_OTHER): Payer: BC Managed Care – PPO

## 2018-10-14 ENCOUNTER — Other Ambulatory Visit: Payer: Self-pay

## 2018-10-14 DIAGNOSIS — E039 Hypothyroidism, unspecified: Secondary | ICD-10-CM

## 2018-10-14 LAB — T4, FREE: Free T4: 1.37 ng/dL (ref 0.60–1.60)

## 2018-10-14 LAB — TSH: TSH: 0.38 u[IU]/mL (ref 0.35–4.50)

## 2018-10-15 NOTE — Progress Notes (Signed)
Patient ID: Joan Walker, female   DOB: 1975/07/30, 43 y.o.   MRN: [id number]           Referring Physician: Lupe Carney  Reason for Appointment: Endocrinology visit     History of Present Illness:   Hypothyroidism was first diagnosed in ?  2015  At the time of diagnosis patient was getting a routine lab test for her annual exam but not having any symptoms of  fatigue, cold sensitivity, difficulty concentrating, dry skin, weight gain or hair loss.  Details of her prior treatments are not available except results pending 2017 when her thyroid supplement was that 150 mcg daily Subsequently her doses had been progressively increased and apparently in 2018 she was taking 200 mcg daily but no labs from 2018 are available Other labs indicate persistently high TSH levels since 2017  RECENT history: TSH on her initial consultation 5/19 was 7.7 when she was taking 250 mcg daily At that time she was complaining of feeling tired periodically  Since increasing her dosage to 300 mcg levothyroxine she has had more energy Her dose has been continued unchanged since 07/2017  Overall she feels fairly good and has lost weight since last year  She is taking the levothyroxine supplement between 4-6 AM without any food and no new calcium or iron supplements  Her TSH has been consistently normal since 7/19 but now it is low normal at 0.38         Patient's weight history is as follows:  Wt Readings from Last 3 Encounters:  10/18/18 (!) 345 lb (156.5 kg)  10/28/17 (!) 354 lb (160.6 kg)  08/11/17 (!) 360 lb 6.4 oz (163.5 kg)    Thyroid function results have been as follows:  Lab Results  Component Value Date   TSH 0.38 10/14/2018   TSH 2.37 04/06/2018   TSH 2.88 10/22/2017   TSH 3.81 07/22/2017   FREET4 1.37 10/14/2018   FREET4 1.30 04/06/2018   FREET4 1.03 10/22/2017   FREET4 1.03 07/22/2017   T3FREE 4.0 07/22/2017   T3FREE 4.1 06/02/2017   Date   free T4   TSH   T3   04/02/2015    11.38   06/12/2015  6.02    08/10/2015    9.56    10/22/2015     4.99   04/23/2017   5.76    DIABETES:  Patient requests management of her diabetes  Diagnosis date:  07/2018  History:     She had gone to her PCP for annual exam in July and was found to have a glucose of 257 and A1c of 10.4 She was sent to nutritionist, started on monitoring with a One Touch meter and also prescribed metformin 1 g twice daily which she is taking  Non-insulin hypoglycemic drugs: Metformin 1 g twice daily            Side effects from medications: None  Current self management, blood sugar patterns and problems identified:   She says her blood sugars have been checked in the morning only and since she was told by the nutritionist to check them every couple of days she is not doing them regularly  Most of her sugars are between about 190 and 210, rarely if she does readings after meals it will be about 300  She said that she is trying to improve her diet with cutting back on higher carbohydrate foods like pizza and pasta, also has cut back on sweet drinks and even Coke  0.  Usually avoiding juices  Recently has been able to walk 15 to 20 minutes 2 or 3 times a day and has lost weight She has not been told to add another medication by her PCP to her metformin       Monitors blood glucose: Less than Once a day.    Glucometer: One Touch.           Blood Glucose readings as above    Hypoglycemia:  none                        Dietician visit: Most recent:   10/07/2018   Weight control: History reviewed. No pertinent past medical history.  Past Surgical History:  Procedure Laterality Date   CHOLECYSTECTOMY      History reviewed. No pertinent family history.  Social History:  reports that she has never smoked. She has never used smokeless tobacco. She reports that she does not drink alcohol or use drugs.  Allergies: No Known Allergies  Allergies as of 10/18/2018   No Known Allergies       Medication List       Accurate as of October 18, 2018  8:05 AM. If you have any questions, ask your nurse or doctor.        atorvastatin 10 MG tablet Commonly known as: LIPITOR Take 10 mg by mouth daily.   levothyroxine 300 MCG tablet Commonly known as: SYNTHROID Take 1 tablet (300 mcg total) by mouth daily before breakfast.   lisinopril 30 MG tablet Commonly known as: ZESTRIL Take 30 mg by mouth daily.   metFORMIN 500 MG tablet Commonly known as: GLUCOPHAGE Take 500 mg by mouth 2 (two) times daily with a meal. What changed: Another medication with the same name was removed. Continue taking this medication, and follow the directions you see here. Changed by: Reather LittlerAjay Kashmere Staffa, MD          Review of Systems       Mild generalized puffiness of her face without edema of the eyes present       Has had recent UTI  Last diabetic foot exam was in 07/2018  She refuses to take the influenza vaccine   Examination:    BP 134/80 (BP Location: Left Arm, Patient Position: Sitting, Cuff Size: Normal)    Pulse 78    Ht 5\' 7"  (1.702 m)    Wt (!) 345 lb (156.5 kg)    SpO2 98%    BMI 54.03 kg/m     Assessment:  HYPOTHYROIDISM, diagnosed in 2014 also and with no specific symptoms at baseline  Her hypothyroidism has been consistently well controlled with 300 mcg levothyroxine daily She has not had any new fatigue Has lost some weight since last year Although her TSH is again normal it is now only 0.38 compared to mid normal before She has done well with taking levothyroxine consistently before breakfast with water  DIABETES: This is relatively recently diagnosed and she is on metformin only with baseline A1c of over 10% Discussed diabetes management, problems associated with diabetes as a disease, different aspects of management including types of medications available She has inadequate control and unlikely to get control with monotherapy using metformin with continued blood  sugars in the 200 range fasting Although she may be a good candidate for SGLT2 drug she will probably lose weight better and have better outcomes with GLP-1 drugs Also recently had a UTI  PLAN:   Continue  300 mcg daily of levothyroxine prescription but take 6-1/2 tablets a week  Discussed with the patient the nature of GLP-1 drugs, the actions on insulin secretion, slowing stomach emptying, reduction of appetite and reduced liver glucose production Explained that Rybelsus improves blood sugar control as well as produces weight loss and reduces cardiovascular events. Explained possible side effects especially nausea and vomiting that may initially; usually side effects improved with time.   Patient to call if nausea or vomiting does not improve within 2 weeks Have explained the need to take the capsules on empty stomach 30 minutes before breakfast daily.  Patient education material given  She was given a sample of 3 mg to take daily for 4 weeks and come back for follow-up  Discussed need for taking blood sugars after meals also rather than just fasting Continue improving diet and regular exercise  A1c will be checked on her next visit We will also again discussed the importance of taking influenza vaccine when she comes back  Patient Instructions  1/2 Synthroid on Sunday   Check blood sugars on waking up 3 days a week  Also check blood sugars about 2 hours after meals and do this after different meals by rotation  Recommended blood sugar levels on waking up are 90-130 and about 2 hours after meal is 130-160  Please bring your blood sugar monitor to each visit, thank you  Rybelsus in am, will go to 7 mg in 1 month     Tyr Franca 10/18/2018, 8:05 AM    Note: This office note was prepared with Dragon voice recognition system technology. Any transcriptional errors that result from this process are unintentional.

## 2018-10-18 ENCOUNTER — Ambulatory Visit (INDEPENDENT_AMBULATORY_CARE_PROVIDER_SITE_OTHER): Payer: BC Managed Care – PPO | Admitting: Endocrinology

## 2018-10-18 ENCOUNTER — Other Ambulatory Visit: Payer: Self-pay

## 2018-10-18 ENCOUNTER — Encounter: Payer: Self-pay | Admitting: Endocrinology

## 2018-10-18 VITALS — BP 134/80 | HR 78 | Ht 67.0 in | Wt 345.0 lb

## 2018-10-18 DIAGNOSIS — E039 Hypothyroidism, unspecified: Secondary | ICD-10-CM | POA: Diagnosis not present

## 2018-10-18 DIAGNOSIS — E1165 Type 2 diabetes mellitus with hyperglycemia: Secondary | ICD-10-CM | POA: Diagnosis not present

## 2018-10-18 NOTE — Patient Instructions (Addendum)
1/2 Synthroid on Sunday   Check blood sugars on waking up 3 days a week  Also check blood sugars about 2 hours after meals and do this after different meals by rotation  Recommended blood sugar levels on waking up are 90-130 and about 2 hours after meal is 130-160  Please bring your blood sugar monitor to each visit, thank you  Rybelsus in am, will go to 7 mg in 1 month

## 2018-11-12 ENCOUNTER — Telehealth: Payer: Self-pay | Admitting: Endocrinology

## 2018-11-12 ENCOUNTER — Other Ambulatory Visit: Payer: Self-pay

## 2018-11-12 ENCOUNTER — Other Ambulatory Visit (INDEPENDENT_AMBULATORY_CARE_PROVIDER_SITE_OTHER): Payer: BC Managed Care – PPO

## 2018-11-12 DIAGNOSIS — E039 Hypothyroidism, unspecified: Secondary | ICD-10-CM

## 2018-11-12 DIAGNOSIS — E1165 Type 2 diabetes mellitus with hyperglycemia: Secondary | ICD-10-CM

## 2018-11-12 LAB — LIPID PANEL
Cholesterol: 122 mg/dL (ref 0–200)
HDL: 29.7 mg/dL — ABNORMAL LOW (ref >39.00)
LDL Cholesterol: 68 mg/dL (ref 0–99)
NonHDL: 92.27
Total CHOL/HDL Ratio: 4
Triglycerides: 120 mg/dL (ref 0.0–149.0)
VLDL: 24 mg/dL (ref 0.0–40.0)

## 2018-11-12 LAB — BASIC METABOLIC PANEL
BUN: 11 mg/dL (ref 6–23)
CO2: 25 mEq/L (ref 19–32)
Calcium: 9 mg/dL (ref 8.4–10.5)
Chloride: 103 mEq/L (ref 96–112)
Creatinine, Ser: 0.71 mg/dL (ref 0.40–1.20)
GFR: 89.52 mL/min (ref >60.00)
Glucose, Bld: 179 mg/dL — ABNORMAL HIGH (ref 70–99)
Potassium: 4.3 mEq/L (ref 3.5–5.1)
Sodium: 137 mEq/L (ref 135–145)

## 2018-11-12 LAB — TSH: TSH: 0.03 u[IU]/mL — ABNORMAL LOW (ref 0.35–4.50)

## 2018-11-12 MED ORDER — RYBELSUS 7 MG PO TABS: 1.0000 | ORAL_TABLET | Freq: Every day | ORAL | 2 refills | Status: DC

## 2018-11-12 NOTE — Telephone Encounter (Signed)
Rx sent 

## 2018-11-12 NOTE — Telephone Encounter (Signed)
MEDICATION: Rybelsus  PHARMACY:  [facility]   IS THIS A 90 DAY SUPPLY :   IS PATIENT OUT OF MEDICATION:    IF NOT; HOW MUCH IS LEFT: 4 pills left  LAST APPOINTMENT DATE: @9 /28/2020  NEXT APPOINTMENT DATE:@10 /23/2020  DO WE HAVE YOUR PERMISSION TO LEAVE A DETAILED MESSAGE: yes [phone]  OTHER COMMENTS:    **Let patient know to contact pharmacy at the end of the day to make sure medication is ready. **  ** Please notify patient to allow 48-72 hours to process**  **Encourage patient to contact the pharmacy for refills or they can request refills through [facility]**

## 2018-11-13 LAB — FRUCTOSAMINE: Fructosamine: 250 umol/L (ref 0–285)

## 2018-11-15 ENCOUNTER — Other Ambulatory Visit: Payer: BC Managed Care – PPO

## 2018-11-18 ENCOUNTER — Ambulatory Visit (INDEPENDENT_AMBULATORY_CARE_PROVIDER_SITE_OTHER): Payer: BC Managed Care – PPO | Admitting: Endocrinology

## 2018-11-18 ENCOUNTER — Encounter: Payer: Self-pay | Admitting: Endocrinology

## 2018-11-18 ENCOUNTER — Other Ambulatory Visit: Payer: Self-pay

## 2018-11-18 DIAGNOSIS — E119 Type 2 diabetes mellitus without complications: Secondary | ICD-10-CM | POA: Insufficient documentation

## 2018-11-18 DIAGNOSIS — E782 Mixed hyperlipidemia: Secondary | ICD-10-CM | POA: Diagnosis not present

## 2018-11-18 DIAGNOSIS — E039 Hypothyroidism, unspecified: Secondary | ICD-10-CM

## 2018-11-18 DIAGNOSIS — E1165 Type 2 diabetes mellitus with hyperglycemia: Secondary | ICD-10-CM | POA: Insufficient documentation

## 2018-11-18 MED ORDER — RYBELSUS 7 MG PO TABS: 1.0000 | ORAL_TABLET | Freq: Every day | ORAL | 1 refills | Status: DC

## 2018-11-18 NOTE — Addendum Note (Signed)
Addended by: Elayne Snare on: 11/18/2018 03:26 PM   Modules accepted: Orders

## 2018-11-18 NOTE — Progress Notes (Signed)
Patient ID: Joan Walker, female   DOB: 12-19-1975, 43 y.o.   MRN: [id number]           Referring Physician: Donnie Coffin  Reason for Appointment: Endocrinology follow-up  Today's office visit was provided via telemedicine using video technique The patient was explained the limitations of evaluation and management by telemedicine and the availability of in person appointments.  The patient understood the limitations and agreed to proceed. Patient also understood that the telehealth visit is billable. . Location of the patient: Patient's home . Location of the provider: Physician office Only the patient and myself were participating in the encounter       History of Present Illness:   Hypothyroidism was first diagnosed in ?  2015  At the time of diagnosis patient was getting a routine lab test for her annual exam but not having any symptoms of  fatigue, cold sensitivity, difficulty concentrating, dry skin, weight gain or hair loss.  Details of her prior treatments are not available except results pending 2017 when her thyroid supplement was that 150 mcg daily Subsequently her doses had been progressively increased and apparently in 2018 she was taking 200 mcg daily but no labs from 2018 are available Other labs indicate persistently high TSH levels since 2017  RECENT history: TSH on her initial consultation 5/19 was 7.7 when she was taking 250 mcg daily At that time she was complaining of feeling tired periodically  Since increasing her dosage to 300 mcg levothyroxine she has felt less tired  Since her TSH was low normal in September she was told to take 6-1/2 tablets a week of her 300 mcg  She is taking the levothyroxine supplement between 4-6 AM without any food or vitamins She feels fairly good with her energy level, no shakiness She thinks she has lost 15 pounds over the last few weeks and continues to lose weight since last year  Her TSH has now become suppressed at  0.03          Patient's weight history is as follows:  Wt Readings from Last 3 Encounters:  10/18/18 (!) 345 lb (156.5 kg)  10/28/17 (!) 354 lb (160.6 kg)  08/11/17 (!) 360 lb 6.4 oz (163.5 kg)    Thyroid function results have been as follows:  Lab Results  Component Value Date   TSH 0.03 (L) 11/12/2018   TSH 0.38 10/14/2018   TSH 2.37 04/06/2018   TSH 2.88 10/22/2017   FREET4 1.37 10/14/2018   FREET4 1.30 04/06/2018   FREET4 1.03 10/22/2017   FREET4 1.03 07/22/2017   T3FREE 4.0 07/22/2017   T3FREE 4.1 06/02/2017   Date   free T4   TSH   T3   04/02/2015   11.38   06/12/2015  6.02    08/10/2015    9.56    10/22/2015     4.99   04/23/2017   5.76    DIABETES: Type 2 diabetes  Diagnosis date:  07/2018  History:     She had gone to her PCP for annual exam in July and was found to have a glucose of 257 and A1c of 10.4 She was sent to nutritionist, started on monitoring with a One Touch meter and also prescribed metformin 1 g twice daily which she is taking  Non-insulin hypoglycemic drugs: Metformin 0.5 g twice daily, Rybelsus in a.m.           Side effects from medications: None  Current self management, blood sugar patterns  and problems identified:  Her fructosamine is now 250  . She was started on RYBELSUS in September because of blood sugars as high as 300 on Metformin alone . She is still not checking readings after meals even though she was advised to do so . She has no side effects from Rybelsus, initially did feel some increased satiety . She just went up to 7 mg dose yesterday with the new prescription . FASTING blood sugars are still high and lowest reading is only 135, mostly around 150 . Lab glucose was 179 fasting but she had eaten chicken nuggets the night before . She says she is afraid to see what her sugars are after meals . She has increased her exercise both with walking and upper body exercises . She has been able to get back on portions overall and  eating smaller but more frequent meals No side effects from Metformin       Monitors blood glucose: Less than Once a day.    Glucometer: One Touch.           Blood Glucose readings as above    Hypoglycemia:  none                        Dietician visit: Most recent:   10/07/2018   Weight control:  No past medical history on file.  Past Surgical History:  Procedure Laterality Date  . CHOLECYSTECTOMY      No family history on file.  Social History:  reports that she has never smoked. She has never used smokeless tobacco. She reports that she does not drink alcohol or use drugs.  Allergies: No Known Allergies  Allergies as of 11/18/2018   No Known Allergies     Medication List       Accurate as of November 18, 2018  8:25 AM. If you have any questions, ask your nurse or doctor.        atorvastatin 10 MG tablet Commonly known as: LIPITOR Take 10 mg by mouth daily.   levothyroxine 300 MCG tablet Commonly known as: SYNTHROID Take 1 tablet (300 mcg total) by mouth daily before breakfast.   lisinopril 30 MG tablet Commonly known as: ZESTRIL Take 30 mg by mouth daily.   metFORMIN 500 MG tablet Commonly known as: GLUCOPHAGE Take 500 mg by mouth 2 (two) times daily with a meal.   Rybelsus 7 MG Tabs Generic drug: Semaglutide Take 1 tablet by mouth daily. Take 1 tablet by mouth once daily.          Review of Systems   LIPIDS: Treated with atorvastatin by PCP Last LDL 145 at baseline  Last diabetic foot exam was in 07/2018  She has had the flu vaccine on 11/06/2018  Treated with lisinopril for history of hypertension   Examination:    There were no vitals taken for this visit.    Assessment:  HYPOTHYROIDISM, diagnosed in 2014 also and with no specific symptoms at baseline  She has been subjectively feeling well without any fatigue Also for various reasons has been losing weight especially recently Although her TSH is 0.03 she is not  symptomatic  Likely is needing less supplements because of weight loss Discussed interpretation of her thyroid labs including TSH She is very regular with taking her supplements    DIABETES with obesity, last BMI 54:  With adding Rybelsus to her Metformin her blood sugar improving although not down to below 130 fasting as yet  She has not checked readings after meals She is trying to modify her diet appropriately Also is trying to exercise more than usual  Likely will benefit from a higher dose of Metformin, currently only on 1 g daily Also has just started 7 mg Rybelsus prescription this week  HYPERCHOLESTEROLEMIA: Needs follow-up since she has been started on atorvastatin  PLAN:   Continue 300 mcg daily of levothyroxine prescription but take 5-1/2 tablets a week She will not take any on Monday and take a half a pill on another day  She will continue with the 7 mg Rybelsus in the mornings Encouraged her to continue her increased activity level Cut back on high fat foods more consistently Keep portions of carbohydrates small again Metformin will be increased to 2 tablets in the evening giving her total daily dose of 1500 mg  Discussed need for taking blood sugars after meals also rather than just fasting Continue improving diet and regular exercise  Check lipids on the next visit  A1c will be checked on her next visit  Follow-up in 6 weeks  There are no Patient Instructions on file for this visit.  Total visit time for evaluation and management of multiple problems and counseling =25 minutes  Reather Littler 11/18/2018, 8:25 AM    Note: This office note was prepared with Dragon voice recognition system technology. Any transcriptional errors that result from this process are unintentional.

## 2018-11-25 ENCOUNTER — Other Ambulatory Visit: Payer: Self-pay

## 2018-11-30 DIAGNOSIS — E119 Type 2 diabetes mellitus without complications: Secondary | ICD-10-CM | POA: Diagnosis not present

## 2018-12-30 ENCOUNTER — Other Ambulatory Visit: Payer: Self-pay | Admitting: Endocrinology

## 2018-12-31 ENCOUNTER — Other Ambulatory Visit (INDEPENDENT_AMBULATORY_CARE_PROVIDER_SITE_OTHER): Payer: BC Managed Care – PPO

## 2018-12-31 ENCOUNTER — Other Ambulatory Visit: Payer: Self-pay

## 2018-12-31 DIAGNOSIS — E1165 Type 2 diabetes mellitus with hyperglycemia: Secondary | ICD-10-CM | POA: Diagnosis not present

## 2018-12-31 DIAGNOSIS — E039 Hypothyroidism, unspecified: Secondary | ICD-10-CM | POA: Diagnosis not present

## 2018-12-31 DIAGNOSIS — E782 Mixed hyperlipidemia: Secondary | ICD-10-CM

## 2018-12-31 LAB — COMPREHENSIVE METABOLIC PANEL
ALT: 20 U/L (ref 0–35)
AST: 19 U/L (ref 0–37)
Albumin: 4 g/dL (ref 3.5–5.2)
Alkaline Phosphatase: 66 U/L (ref 39–117)
BUN: 14 mg/dL (ref 6–23)
CO2: 26 mEq/L (ref 19–32)
Calcium: 9.4 mg/dL (ref 8.4–10.5)
Chloride: 104 mEq/L (ref 96–112)
Creatinine, Ser: 0.83 mg/dL (ref 0.40–1.20)
GFR: 74.71 mL/min (ref >60.00)
Glucose, Bld: 133 mg/dL — ABNORMAL HIGH (ref 70–99)
Potassium: 4.3 mEq/L (ref 3.5–5.1)
Sodium: 139 mEq/L (ref 135–145)
Total Bilirubin: 0.4 mg/dL (ref 0.2–1.2)
Total Protein: 7.1 g/dL (ref 6.0–8.3)

## 2018-12-31 LAB — LIPID PANEL
Cholesterol: 133 mg/dL (ref 0–200)
HDL: 32.7 mg/dL — ABNORMAL LOW (ref >39.00)
LDL Cholesterol: 69 mg/dL (ref 0–99)
NonHDL: 100.72
Total CHOL/HDL Ratio: 4
Triglycerides: 161 mg/dL — ABNORMAL HIGH (ref 0.0–149.0)
VLDL: 32.2 mg/dL (ref 0.0–40.0)

## 2018-12-31 LAB — TSH: TSH: 0.77 u[IU]/mL (ref 0.35–4.50)

## 2018-12-31 LAB — HEMOGLOBIN A1C: Hgb A1c MFr Bld: 6.7 % — ABNORMAL HIGH (ref 4.6–6.5)

## 2018-12-31 LAB — T4, FREE: Free T4: 1.15 ng/dL (ref 0.60–1.60)

## 2019-01-03 ENCOUNTER — Telehealth: Payer: Self-pay

## 2019-01-03 LAB — URINALYSIS, ROUTINE W REFLEX MICROSCOPIC
Bilirubin Urine: NEGATIVE
Hgb urine dipstick: NEGATIVE
Ketones, ur: NEGATIVE
Leukocytes,Ua: NEGATIVE
Nitrite: NEGATIVE
RBC / HPF: NONE SEEN (ref 0–?)
Specific Gravity, Urine: >=1.03 — AB (ref 1.000–1.030)
Total Protein, Urine: NEGATIVE
Urine Glucose: NEGATIVE
Urobilinogen, UA: 0.2 (ref 0.0–1.0)
pH: 5.5 (ref 5.0–8.0)

## 2019-01-03 LAB — MICROALBUMIN / CREATININE URINE RATIO
Creatinine,U: 181.9 mg/dL
Microalb Creat Ratio: 3.6 mg/g (ref 0.0–30.0)
Microalb, Ur: 6.6 mg/dL — ABNORMAL HIGH (ref 0.0–1.9)

## 2019-01-03 NOTE — Telephone Encounter (Signed)
Appointment was made for virtual this Friday January 07, 2019 8:15am

## 2019-01-03 NOTE — Telephone Encounter (Signed)
-----   Message from Elayne Snare, MD sent at 01/02/2019  9:30 PM EST ----- Regarding: Appointment needed She had labs but no appointment was set up, needs to be scheduled.  Virtual visit okay

## 2019-01-07 ENCOUNTER — Ambulatory Visit (INDEPENDENT_AMBULATORY_CARE_PROVIDER_SITE_OTHER): Payer: BC Managed Care – PPO | Admitting: Endocrinology

## 2019-01-07 ENCOUNTER — Encounter: Payer: Self-pay | Admitting: Endocrinology

## 2019-01-07 ENCOUNTER — Other Ambulatory Visit: Payer: Self-pay

## 2019-01-07 DIAGNOSIS — E039 Hypothyroidism, unspecified: Secondary | ICD-10-CM

## 2019-01-07 DIAGNOSIS — E1165 Type 2 diabetes mellitus with hyperglycemia: Secondary | ICD-10-CM | POA: Diagnosis not present

## 2019-01-07 DIAGNOSIS — E782 Mixed hyperlipidemia: Secondary | ICD-10-CM

## 2019-01-07 NOTE — Progress Notes (Signed)
Patient ID: Joan Walker, female   DOB: 01-26-1975, 43 y.o.   MRN: [id number]           Referring Physician: Donnie Coffin  Reason for Appointment: Endocrinology follow-up  Today's office visit was provided via telemedicine using video technique The patient was explained the limitations of evaluation and management by telemedicine and the availability of in person appointments.  The patient understood the limitations and agreed to proceed. Patient also understood that the telehealth visit is billable. . Location of the patient: Patient's home . Location of the provider: Physician office Only the patient and myself were participating in the encounter      History of Present Illness:   Hypothyroidism was first diagnosed in ?  2015  At the time of diagnosis patient was getting a routine lab test for her annual exam but not having any symptoms of  fatigue, cold sensitivity, difficulty concentrating, dry skin, weight gain or hair loss.  Details of her prior treatments are not available except results pending 2017 when her thyroid supplement was that 150 mcg daily Subsequently her doses had been progressively increased and apparently in 2018 she was taking 200 mcg daily but no labs from 2018 are available Other labs indicate persistently high TSH levels since 2017  RECENT history: TSH on her initial consultation 5/19 was 7.7 when she was taking 250 mcg daily At that time she was complaining of feeling tired periodically Initially dose was increased to 300 mcg levothyroxine which improved her energy level  However subsequently as needed gradual reduction of her supplement Currently taking the 300 mcg dose 5 days a week and 150 mcg once a week  She is taking the levothyroxine supplement well before breakfast on empty stomach  She feels fairly good and does not complain of any fatigue now, also continues to lose weight  Her TSH has now come back to normal at 0.77            Patient's weight history is as follows:  Wt Readings from Last 3 Encounters:  10/18/18 (!) 345 lb (156.5 kg)  10/28/17 (!) 354 lb (160.6 kg)  08/11/17 (!) 360 lb 6.4 oz (163.5 kg)    Thyroid function results have been as follows:  Lab Results  Component Value Date   TSH 0.77 12/31/2018   TSH 0.03 (L) 11/12/2018   TSH 0.38 10/14/2018   TSH 2.37 04/06/2018   FREET4 1.15 12/31/2018   FREET4 1.37 10/14/2018   FREET4 1.30 04/06/2018   FREET4 1.03 10/22/2017   T3FREE 4.0 07/22/2017   T3FREE 4.1 06/02/2017   Date   free T4   TSH   T3   04/02/2015   11.38   06/12/2015  6.02    08/10/2015    9.56    10/22/2015     4.99   04/23/2017   5.76    DIABETES: Type 2 diabetes  Diagnosis date:  07/2018  History:     She had gone to her PCP for annual exam in July and was found to have a glucose of 257 and A1c of 10.4 She was sent to nutritionist, started on monitoring with a One Touch meter and also prescribed metformin 1 g twice daily which she is taking  Non-insulin hypoglycemic drugs: Metformin 0.5 g twice daily, Rybelsus 7 mg in a.m.           Side effects from medications:  Some nausea from Rybelsus  Current self management, blood sugar patterns and problems identified:  Her A1c is now 6.7  . She was started on RYBELSUS in September because of blood sugars as high as 300 on Metformin alone . She started taking 7 mg in late October . Although initially she did not have any side effects for the last week she has had some vomiting but appears that she is taking her Rybelsus about an hour and half before breakfast now instead of 30 minutes before; she has nausea occurring gradually later in the day and mostly in the evenings . She is still not checking readings after dinner and only a couple of times after breakfast and lunch . Highest blood sugar was 200 after eating frosted flakes in the morning . She was also told to increase Metformin to 2 tablets in the evening . With this her  fasting blood sugars appear to be relatively better, previously around 150 . She is very consistent with trying to do some walking at least once or twice a day . She thinks that overall she has lost 27 pounds . She thinks that she is seriously trying to avoid high-fat meals        Monitors blood glucose: Less than Once a day.    Glucometer: One Touch.           Blood Glucose readings from patient review:   PRE-MEAL Fasting Lunch Dinner Bedtime Overall  Glucose range:  123-142  154     Mean/median:     ?   POST-MEAL PC Breakfast PC Lunch PC Dinner  Glucose range:  200  132, 135   Mean/median:        Hypoglycemia:  none                        Dietician visit: Most recent:   10/07/2018   Weight control:  No past medical history on file.  Past Surgical History:  Procedure Laterality Date  . CHOLECYSTECTOMY      No family history on file.  Social History:  reports that she has never smoked. She has never used smokeless tobacco. She reports that she does not drink alcohol or use drugs.  Allergies: No Known Allergies  Allergies as of 01/07/2019   No Known Allergies     Medication List       Accurate as of January 07, 2019  8:20 AM. If you have any questions, ask your nurse or doctor.        atorvastatin 10 MG tablet Commonly known as: LIPITOR Take 10 mg by mouth daily.   levothyroxine 300 MCG tablet Commonly known as: SYNTHROID TAKE 1 TABLET DAILY BEFORE BREAKFAST   lisinopril 30 MG tablet Commonly known as: ZESTRIL Take 30 mg by mouth daily.   metFORMIN 500 MG tablet Commonly known as: GLUCOPHAGE Take 500 mg by mouth 2 (two) times daily with a meal.   Rybelsus 7 MG Tabs Generic drug: Semaglutide Take 1 tablet by mouth daily. Take 1 tablet by mouth once daily.          Review of Systems   LIPIDS: Treated with atorvastatin by PCP LDL 145 at baseline Recent lipids show improvement but triglycerides higher slightly  Lab Results  Component  Value Date   CHOL 133 12/31/2018   CHOL 122 11/12/2018   Lab Results  Component Value Date   HDL 32.70 (L) 12/31/2018   HDL 29.70 (L) 11/12/2018   Lab Results  Component Value Date   LDLCALC 69 12/31/2018   LDLCALC  68 11/12/2018   Lab Results  Component Value Date   TRIG 161.0 (H) 12/31/2018   TRIG 120.0 11/12/2018   Lab Results  Component Value Date   CHOLHDL 4 12/31/2018   CHOLHDL 4 11/12/2018   No results found for: LDLDIRECT  Last diabetic foot exam was in 07/2018  She has had the flu vaccine on 11/06/2018  Treated with lisinopril for history of hypertension   Examination:    There were no vitals taken for this visit.    Assessment:  HYPOTHYROIDISM, diagnosed in 2014 also and with no specific symptoms at baseline  Thyroid levels are now finally back to normal As before her weight loss appears to be continuing and she is requiring lower doses She does take her thyroid supplement on empty stomach in the morning  She feels subjectively good and he also is very active With her regimen of 300 mcg, 5-1/2 days a week she is doing well with TSH back to normal at 0.77 She will continue the same    DIABETES with obesity, last BMI 54:  Continues to improve and A1c is now below 7 Likely benefiting from lifestyle changes as well as Rybelsus 7 mg Also on Metformin now 1500 mg a day She is reporting nausea and vomiting the last week but this is likely to be from her taking the Rybelsus about 90-minute before breakfast instead of 30 minutes Also does need to check readings after meals which are not available Discussed avoiding high glycemic index foods especially cereals and fast food breakfast which she sometimes is having  HYPERCHOLESTEROLEMIA: On Lipitor, well-controlled  Urine microalbumin normal  PLAN:   Continue 300 mcg daily of levothyroxine prescription and take 5-1/2 tablets a week  She can leave off her Rybelsus for a couple of days at least till her  nausea is better She will need to take this 30 minutes before eating and not go longer Discussed that she can take her levothyroxine 15 minutes before the Rybelsus or at the same time She will cut back on sodium foods as discussed above Make sure she checks readings after dinner consistently to help adjust her diet better However if she has nausea persistently will need to cut back Rybelsus to 3 mg potentially Continue higher doses of Metformin for now  Follow-up in 8 weeks  There are no Patient Instructions on file for this visit.  Total visit time for evaluation and management of multiple problems and counseling =25 minutes  Reather Littler 01/07/2019, 8:20 AM    Note: This office note was prepared with Dragon voice recognition system technology. Any transcriptional errors that result from this process are unintentional.

## 2019-01-13 ENCOUNTER — Other Ambulatory Visit: Payer: Self-pay

## 2019-01-13 ENCOUNTER — Telehealth: Payer: Self-pay

## 2019-01-13 DIAGNOSIS — E1165 Type 2 diabetes mellitus with hyperglycemia: Secondary | ICD-10-CM

## 2019-01-13 MED ORDER — RYBELSUS 3 MG PO TABS: 1.0000 | ORAL_TABLET | Freq: Every day | ORAL | 0 refills | Status: DC

## 2019-01-13 NOTE — Telephone Encounter (Signed)
LVM requesting returned call 

## 2019-01-13 NOTE — Telephone Encounter (Signed)
Pt returned call. Informed of Dr. Ronnie Derby orders as written below. New Rx sent per Dr. Ronnie Derby orders, as well as previous Rx d/c'd. Pt has also been advised to call if nausea resumes. Verbalized acceptance and understanding.

## 2019-01-13 NOTE — Telephone Encounter (Signed)
Pt called stating she has not had any further N/V since stopping Rybelsus. Is wanting to know if she should resume. States CBG last night was 179 after eating a caramel apple and 129 the night before. Routing this message to Dr. Dwyane Dee for further instructions.

## 2019-01-13 NOTE — Telephone Encounter (Signed)
I would recommend that she start 3 mg and be sure to eat 30 minutes later

## 2019-02-14 ENCOUNTER — Other Ambulatory Visit: Payer: Self-pay

## 2019-02-14 MED ORDER — LISINOPRIL 30 MG PO TABS: 30.0000 mg | ORAL_TABLET | Freq: Every day | ORAL | 2 refills | Status: DC

## 2019-02-14 MED ORDER — RYBELSUS 7 MG PO TABS: 1.0000 | ORAL_TABLET | Freq: Every day | ORAL | 2 refills | Status: DC

## 2019-02-14 MED ORDER — ATORVASTATIN CALCIUM 10 MG PO TABS: 10.0000 mg | ORAL_TABLET | Freq: Every day | ORAL | 2 refills | Status: DC

## 2019-02-14 NOTE — Telephone Encounter (Signed)
Okay to go back to 7 mg Rybelsus She needs to be scheduled for follow-up in about a month with labs

## 2019-02-14 NOTE — Telephone Encounter (Signed)
Pt called and wanted to know if she should go back up to 7mg  tablets daily. She reports no N/V and feels like it could have been something she ate when she was experiencing this previously when she was on the 7mg  tablets.   She would also like to know if you would refill her Lisinopril and Atorvastatin or if you want to have Dr. do it.

## 2019-02-14 NOTE — Telephone Encounter (Signed)
Called pt and gave her MD message. Pt verbalized understanding and new Rx for Rybelsus 7mg  tablets sent to pharmacy.

## 2019-02-16 ENCOUNTER — Other Ambulatory Visit: Payer: Self-pay

## 2019-02-16 ENCOUNTER — Telehealth: Payer: Self-pay | Admitting: Endocrinology

## 2019-02-16 MED ORDER — ATORVASTATIN CALCIUM 10 MG PO TABS: 10.0000 mg | ORAL_TABLET | Freq: Every day | ORAL | 1 refills | Status: DC

## 2019-02-16 MED ORDER — LISINOPRIL 30 MG PO TABS: 30.0000 mg | ORAL_TABLET | Freq: Every day | ORAL | 1 refills | Status: DC

## 2019-02-16 NOTE — Telephone Encounter (Signed)
Patient does not want the following RX's to be sent to [facility]. Patient requests that the following RX's be sent [facility]  MEDICATION: atorvastatin (LIPITOR) 10 MG tablet AND lisinopril (ZESTRIL) 30 MG tablet  PHARMACY:   [facility] - [place], [place] - [address] Phone:  [phone]  Fax:  [phone]      IS THIS A 90 DAY SUPPLY : Yes  IS PATIENT OUT OF MEDICATION: No  IF NOT; HOW MUCH IS LEFT: Approx. 30 days  LAST APPOINTMENT DATE: @1 /25/2021  NEXT APPOINTMENT DATE:@2 /26/2021  DO WE HAVE YOUR PERMISSION TO LEAVE A DETAILED MESSAGE: Yes  OTHER COMMENTS:    **Let patient know to contact pharmacy at the end of the day to make sure medication is ready. **  ** Please notify patient to allow 48-72 hours to process**  **Encourage patient to contact the pharmacy for refills or they can request refills through [facility]**

## 2019-02-16 NOTE — Telephone Encounter (Signed)
Rx sent 

## 2019-03-16 ENCOUNTER — Other Ambulatory Visit: Payer: BC Managed Care – PPO

## 2019-03-18 ENCOUNTER — Other Ambulatory Visit: Payer: BC Managed Care – PPO

## 2019-03-18 ENCOUNTER — Other Ambulatory Visit: Payer: Self-pay

## 2019-03-18 ENCOUNTER — Other Ambulatory Visit (INDEPENDENT_AMBULATORY_CARE_PROVIDER_SITE_OTHER): Payer: BC Managed Care – PPO

## 2019-03-18 DIAGNOSIS — E1165 Type 2 diabetes mellitus with hyperglycemia: Secondary | ICD-10-CM

## 2019-03-18 DIAGNOSIS — E039 Hypothyroidism, unspecified: Secondary | ICD-10-CM

## 2019-03-18 LAB — BASIC METABOLIC PANEL
BUN: 14 mg/dL (ref 6–23)
CO2: 27 mEq/L (ref 19–32)
Calcium: 9.4 mg/dL (ref 8.4–10.5)
Chloride: 103 mEq/L (ref 96–112)
Creatinine, Ser: 0.71 mg/dL (ref 0.40–1.20)
GFR: 89.38 mL/min (ref >60.00)
Glucose, Bld: 129 mg/dL — ABNORMAL HIGH (ref 70–99)
Potassium: 4.2 mEq/L (ref 3.5–5.1)
Sodium: 138 mEq/L (ref 135–145)

## 2019-03-18 LAB — TSH: TSH: 0.22 u[IU]/mL — ABNORMAL LOW (ref 0.35–4.50)

## 2019-03-19 ENCOUNTER — Ambulatory Visit: Payer: BC Managed Care – PPO

## 2019-03-19 LAB — FRUCTOSAMINE: Fructosamine: 227 umol/L (ref 0–285)

## 2019-03-21 ENCOUNTER — Encounter: Payer: Self-pay | Admitting: Endocrinology

## 2019-03-21 ENCOUNTER — Ambulatory Visit (INDEPENDENT_AMBULATORY_CARE_PROVIDER_SITE_OTHER): Payer: BC Managed Care – PPO | Admitting: Endocrinology

## 2019-03-21 ENCOUNTER — Other Ambulatory Visit: Payer: Self-pay

## 2019-03-21 DIAGNOSIS — E039 Hypothyroidism, unspecified: Secondary | ICD-10-CM

## 2019-03-21 DIAGNOSIS — E782 Mixed hyperlipidemia: Secondary | ICD-10-CM | POA: Diagnosis not present

## 2019-03-21 DIAGNOSIS — E1169 Type 2 diabetes mellitus with other specified complication: Secondary | ICD-10-CM

## 2019-03-21 MED ORDER — METFORMIN HCL 500 MG PO TABS: ORAL_TABLET | ORAL | 1 refills | Status: DC

## 2019-03-21 NOTE — Progress Notes (Signed)
Patient ID: Joan Walker, female   DOB: 04-28-1975, 44 y.o.   MRN: [id number]           Referring Physician: Lupe Carney  Reason for Appointment: Endocrinology follow-up  I connected with the above-named patient by video enabled telemedicine application and verified that I am speaking with the correct person. The patient was explained the limitations of evaluation and management by telemedicine and the availability of in person appointments.  Patient also understood that there may be a patient responsible charge related to this service . Location of the patient: Patient's home . Location of the provider: Physician office Only the patient and myself were participating in the encounter The patient understood the above statements and agreed to proceed.    History of Present Illness:   Hypothyroidism was first diagnosed in ?  2015  At the time of diagnosis patient was getting a routine lab test for her annual exam but not having any symptoms of  fatigue, cold sensitivity, difficulty concentrating, dry skin, weight gain or hair loss.  Details of her prior treatments are not available except results pending 2017 when her thyroid supplement was that 150 mcg daily Subsequently her doses had been progressively increased and apparently in 2018 she was taking 200 mcg daily but no labs from 2018 are available Other labs indicate persistently high TSH levels since 2017  RECENT history: TSH on her initial consultation 5/19 was 7.7 when she was taking 250 mcg daily At that time she was complaining of feeling tired periodically Initially dose was increased to 300 mcg levothyroxine which improved her energy level  However subsequently as needed gradual reduction of her supplement Currently taking the 300 mcg dose and she leaves it off once a week and on another day takes a half a pill The dose was continued unchanged in 12/20  She is taking the levothyroxine supplement well before breakfast  on empty stomach  She does not complain of any tiredness Her weight is slightly lower than on her last visit  Her TSH which was quite normal is now relatively low at 0.22 without increase in free T4          Patient's weight history is as follows:  Wt Readings from Last 3 Encounters:  10/18/18 (!) 345 lb (156.5 kg)  10/28/17 (!) 354 lb (160.6 kg)  08/11/17 (!) 360 lb 6.4 oz (163.5 kg)    Thyroid function results have been as follows:  Lab Results  Component Value Date   TSH 0.22 (L) 03/18/2019   TSH 0.77 12/31/2018   TSH 0.03 (L) 11/12/2018   TSH 0.38 10/14/2018   FREET4 1.15 12/31/2018   FREET4 1.37 10/14/2018   FREET4 1.30 04/06/2018   FREET4 1.03 10/22/2017   T3FREE 4.0 07/22/2017   T3FREE 4.1 06/02/2017   Date   free T4   TSH   T3   04/02/2015   11.38   06/12/2015  6.02    08/10/2015    9.56    10/22/2015     4.99   04/23/2017   5.76    DIABETES: Type 2 diabetes  Diagnosis date:  07/2018  History:     She had gone to her PCP for annual exam in 7/20 and was found to have a glucose of 257 and baseline A1c of 10.4 She was sent to nutritionist, started on monitoring with a One Touch meter and also prescribed metformin 1 g twice daily  Non-insulin hypoglycemic drugs: Metformin 0.5 g a.m., 1  g at dinner, Rybelsus 7 mg in a.m.           Side effects from medications:  Previously nausea from Rybelsus  Current self management, blood sugar patterns and problems identified:  Her A1c is most recently 6.7 in 12/20 Fructosamine is 227   . She has been on RYBELSUS since 09/2018 and the 7 mg dose since 10/20 . On her visit in 12/20 she was complaining of periodic nausea and vomiting . However with her trying to eat her breakfast about 30 minutes after doing the Rybelsus dosage in the morning she is not having any nausea . Although she does think that she has some portion control of her weight loss may be slowing down . Currently she is weighing about 316 pounds at home . Has  not been able to do much walking because of the weather and her schedule now and no home exercises . Although she thinks she is trying to improve her diet she will sometimes eat high fat meals . She was told to add protein to her breakfast daily but she is sometimes only eating oatmeal or cereal . Blood sugar monitoring has been done only on waking up and midday and none after supper Lab glucose was 129 fasting      Monitors blood glucose: Less than Once a day.    Glucometer: One Touch.           Blood Glucose readings from patient review:   PRE-MEAL Fasting Lunch Dinner Bedtime Overall  Glucose range:  91-141  114, 153     Mean/median:        POST-MEAL PC Breakfast PC Lunch PC Dinner  Glucose range:   154   Mean/median:       Previous readings:  PRE-MEAL Fasting Lunch Dinner Bedtime Overall  Glucose range:  123-142  154     Mean/median:     ?   POST-MEAL PC Breakfast PC Lunch PC Dinner  Glucose range:  200  132, 135   Mean/median:                             Dietician visit: Most recent:   10/07/2018   Weight :  Wt Readings from Last 3 Encounters:  10/18/18 (!) 345 lb (156.5 kg)  10/28/17 (!) 354 lb (160.6 kg)  08/11/17 (!) 360 lb 6.4 oz (163.5 kg)     History reviewed. No pertinent past medical history.  Past Surgical History:  Procedure Laterality Date  . CHOLECYSTECTOMY      History reviewed. No pertinent family history.  Social History:  reports that she has never smoked. She has never used smokeless tobacco. She reports that she does not drink alcohol or use drugs.  Allergies: No Known Allergies  Allergies as of 03/21/2019   No Known Allergies     Medication List       Accurate as of March 21, 2019  3:44 PM. If you have any questions, ask your nurse or doctor.        atorvastatin 10 MG tablet Commonly known as: LIPITOR Take 1 tablet (10 mg total) by mouth daily.   levothyroxine 300 MCG tablet Commonly known as: SYNTHROID TAKE 1 TABLET DAILY  BEFORE BREAKFAST   lisinopril 30 MG tablet Commonly known as: ZESTRIL Take 1 tablet (30 mg total) by mouth daily.   metFORMIN 500 MG tablet Commonly known as: GLUCOPHAGE Take 500 mg by mouth in the  morning and at bedtime. Take 1 tablet (500mg  total) by mouth in the mroning and 2 tablets (1000mg  total) in the evening.   Rybelsus 7 MG Tabs Generic drug: Semaglutide Take 1 tablet by mouth daily.          Review of Systems   LIPIDS: Treated with atorvastatin by PCP LDL 145 at baseline Last lipids show improvement but triglycerides higher slightly  Lab Results  Component Value Date   CHOL 133 12/31/2018   CHOL 122 11/12/2018   Lab Results  Component Value Date   HDL 32.70 (L) 12/31/2018   HDL 29.70 (L) 11/12/2018   Lab Results  Component Value Date   LDLCALC 69 12/31/2018   LDLCALC 68 11/12/2018   Lab Results  Component Value Date   TRIG 161.0 (H) 12/31/2018   TRIG 120.0 11/12/2018   Lab Results  Component Value Date   CHOLHDL 4 12/31/2018   CHOLHDL 4 11/12/2018   No results found for: LDLDIRECT  Last diabetic foot exam was in 07/2018  She has had the flu vaccine on 11/06/2018  Treated with lisinopril for history of hypertension   Examination:    There were no vitals taken for this visit.    Assessment:  HYPOTHYROIDISM, diagnosed in 2014 also and with no specific symptoms at baseline  Her levothyroxine requirement has been progressively lower Now with taking the equivalent of about 235 mcg of levothyroxine daily her TSH is slightly low at 0.22 However subjectively she is doing well She has lost weight as part of her diabetes management  She is very consistent with her regimen every morning   DIABETES with morbid obesity:  She is now on Rybelsus in addition to her Metformin  Continues to improve and A1c is last below 7 and fructosamine is 227  Likely benefiting from continuing Rybelsus 7 mg which she is tolerating now Also on Metformin now  1500 mg a day Blood sugar monitoring has been inadequate and no readings after meals especially after dinner Exercise regimen can be improved also  HYPERCHOLESTEROLEMIA: On Lipitor, well-controlled  Urine microalbumin normal  PLAN:   Continue 300 mcg daily of levothyroxine prescription until finished and take it only 5 days a week From her next prescription she will take 2 tablets of the 112 mcg daily   Discussed needing to improve her diet with avoiding high-fat meals consistently  Also needs to have a low-fat protein at breakfast such as eggs or low-fat cheese or yogurt  Encouraged her to start an indoor exercise program with a exercise video until she can start her walking routine regularly  She needs to make sure she takes readings after dinner regularly  No change in Rybelsus or Metformin  New prescription for Metformin will be sent  Follow-up in 3 months  There are no Patient Instructions on file for this visit.    11/08/2018 03/21/2019, 3:44 PM    Note: This office note was prepared with Dragon voice recognition system technology. Any transcriptional errors that result from this process are unintentional.

## 2019-03-30 ENCOUNTER — Telehealth: Payer: Self-pay | Admitting: Endocrinology

## 2019-03-30 NOTE — Telephone Encounter (Signed)
Patient called asking what a good alternative would be for rybelsus? Ph# [phone].

## 2019-03-31 NOTE — Telephone Encounter (Signed)
Pt returned phone call and she stated that Rybelsus would be $200 and her the $10 coupon is expired since it was only for a 6 month period.  Pt's husband checked into Ozempic and Trulicity as well and the cost is the same.  Please advise.

## 2019-03-31 NOTE — Telephone Encounter (Signed)
Need to know why she wants to change.  She can take injectable Trulicity or Ozempic once a week

## 2019-03-31 NOTE — Telephone Encounter (Signed)
Called pt and she stated that she would call back because she was in class at the moment.

## 2019-04-01 ENCOUNTER — Telehealth: Payer: Self-pay | Admitting: Endocrinology

## 2019-04-01 NOTE — Telephone Encounter (Signed)
Patient returned Noah's call. Patient requests that [facility] call patient after 4:00 pm today (04/01/19) at ph# [phone]

## 2019-04-01 NOTE — Telephone Encounter (Signed)
Called pt and left detailed voicemail of MD message and left detailed instructions on how to obtain copay card by texting "READY" to [phone]. Instructed pt to do this and regardless of whether it works or not, call this office back and notify us either way.

## 2019-04-01 NOTE — Telephone Encounter (Signed)
She can try to get a new co-pay card for the Rybelsus which will still be effective.  Please give her the information to get it using a text message

## 2019-04-04 NOTE — Telephone Encounter (Signed)
Called and left voicemail for pt requesting a callback. 

## 2019-04-13 ENCOUNTER — Telehealth: Payer: Self-pay

## 2019-04-13 NOTE — Telephone Encounter (Addendum)
[  10:57 AM] Knute Neu     code did not work for [employer] do you have a minute-patient states you will know what she means and can call her when you get a chance

## 2019-04-21 ENCOUNTER — Other Ambulatory Visit: Payer: Self-pay

## 2019-04-21 MED ORDER — METFORMIN HCL 500 MG PO TABS: ORAL_TABLET | ORAL | 1 refills | Status: DC

## 2019-06-15 ENCOUNTER — Other Ambulatory Visit: Payer: Self-pay

## 2019-06-15 ENCOUNTER — Other Ambulatory Visit (INDEPENDENT_AMBULATORY_CARE_PROVIDER_SITE_OTHER): Payer: BC Managed Care – PPO

## 2019-06-15 DIAGNOSIS — E039 Hypothyroidism, unspecified: Secondary | ICD-10-CM | POA: Diagnosis not present

## 2019-06-15 DIAGNOSIS — E1169 Type 2 diabetes mellitus with other specified complication: Secondary | ICD-10-CM | POA: Diagnosis not present

## 2019-06-15 DIAGNOSIS — E782 Mixed hyperlipidemia: Secondary | ICD-10-CM

## 2019-06-15 LAB — COMPREHENSIVE METABOLIC PANEL
ALT: 15 U/L (ref 0–35)
AST: 15 U/L (ref 0–37)
Albumin: 3.9 g/dL (ref 3.5–5.2)
Alkaline Phosphatase: 61 U/L (ref 39–117)
BUN: 15 mg/dL (ref 6–23)
CO2: 30 mEq/L (ref 19–32)
Calcium: 9 mg/dL (ref 8.4–10.5)
Chloride: 102 mEq/L (ref 96–112)
Creatinine, Ser: 0.77 mg/dL (ref 0.40–1.20)
GFR: 81.3 mL/min (ref >60.00)
Glucose, Bld: 148 mg/dL — ABNORMAL HIGH (ref 70–99)
Potassium: 4.5 mEq/L (ref 3.5–5.1)
Sodium: 135 mEq/L (ref 135–145)
Total Bilirubin: 0.3 mg/dL (ref 0.2–1.2)
Total Protein: 6.7 g/dL (ref 6.0–8.3)

## 2019-06-15 LAB — LIPID PANEL
Cholesterol: 166 mg/dL (ref 0–200)
HDL: 36.5 mg/dL — ABNORMAL LOW (ref >39.00)
LDL Cholesterol: 101 mg/dL — ABNORMAL HIGH (ref 0–99)
NonHDL: 129
Total CHOL/HDL Ratio: 5
Triglycerides: 140 mg/dL (ref 0.0–149.0)
VLDL: 28 mg/dL (ref 0.0–40.0)

## 2019-06-15 LAB — HEMOGLOBIN A1C: Hgb A1c MFr Bld: 6.9 % — ABNORMAL HIGH (ref 4.6–6.5)

## 2019-06-15 LAB — T4, FREE: Free T4: 1.2 ng/dL (ref 0.60–1.60)

## 2019-06-15 LAB — TSH: TSH: 2.9 u[IU]/mL (ref 0.35–4.50)

## 2019-06-15 NOTE — Patient Instructions (Signed)
[id number] 

## 2019-06-22 ENCOUNTER — Telehealth (INDEPENDENT_AMBULATORY_CARE_PROVIDER_SITE_OTHER): Payer: BC Managed Care – PPO | Admitting: Endocrinology

## 2019-06-22 ENCOUNTER — Other Ambulatory Visit: Payer: Self-pay

## 2019-06-22 ENCOUNTER — Encounter: Payer: Self-pay | Admitting: Endocrinology

## 2019-06-22 DIAGNOSIS — E1165 Type 2 diabetes mellitus with hyperglycemia: Secondary | ICD-10-CM

## 2019-06-22 DIAGNOSIS — E782 Mixed hyperlipidemia: Secondary | ICD-10-CM

## 2019-06-22 DIAGNOSIS — E039 Hypothyroidism, unspecified: Secondary | ICD-10-CM

## 2019-06-22 MED ORDER — LEVOTHYROXINE SODIUM 112 MCG PO TABS
224.0000 ug | ORAL_TABLET | Freq: Every day | ORAL | 1 refills | Status: DC
Start: 2019-06-22 — End: 2020-01-12

## 2019-06-22 MED ORDER — METFORMIN HCL ER 500 MG PO TB24: ORAL_TABLET | ORAL | 3 refills | Status: DC

## 2019-06-22 NOTE — Progress Notes (Signed)
Patient ID: Joan Walker, female   DOB: March 02, 1975, 44 y.o.   MRN: [id number]           Referring Physician: Donnie Coffin  Reason for Appointment: Endocrinology follow-up  I connected with the above-named patient by video enabled telemedicine application and verified that I am speaking with the correct person. The patient was explained the limitations of evaluation and management by telemedicine and the availability of in person appointments.  Patient also understood that there may be a patient responsible charge related to this service . Location of the patient: Patient's home . Location of the provider: Physician office Only the patient and myself were participating in the encounter The patient understood the above statements and agreed to proceed.    History of Present Illness:   Hypothyroidism was first diagnosed in ?  2015  At the time of diagnosis patient was getting a routine lab test for her annual exam but not having any symptoms of  fatigue, cold sensitivity, difficulty concentrating, dry skin, weight gain or hair loss.  Details of her prior treatments are not available except results pending 2017 when her thyroid supplement was that 150 mcg daily Subsequently her doses had been progressively increased and apparently in 2018 she was taking 200 mcg daily but no labs from 2018 are available Other labs indicate persistently high TSH levels since 2017  RECENT history: TSH on her initial consultation 5/19 was 7.7 when she was taking 250 mcg daily At that time she was complaining of feeling tired periodically Initially dose was increased to 300 mcg levothyroxine which improved her energy level  However subsequently as needed gradual reduction of her supplement Currently taking the 300 mcg dose 5 days a week her thyroid levels are back to normal  She is taking the levothyroxine supplement well before breakfast on empty stomach  She does not complain of any unusual fatigue  lately  Her weight is gone up about 8 pounds  Her TSH is normal at 2.9 compared to 0.22          Patient's weight history is as follows:  Wt Readings from Last 3 Encounters:  10/18/18 (!) 345 lb (156.5 kg)  10/28/17 (!) 354 lb (160.6 kg)  08/11/17 (!) 360 lb 6.4 oz (163.5 kg)    Thyroid function results have been as follows:  Lab Results  Component Value Date   TSH 2.90 06/15/2019   TSH 0.22 (L) 03/18/2019   TSH 0.77 12/31/2018   TSH 0.03 (L) 11/12/2018   FREET4 1.20 06/15/2019   FREET4 1.15 12/31/2018   FREET4 1.37 10/14/2018   FREET4 1.30 04/06/2018   T3FREE 4.0 07/22/2017   T3FREE 4.1 06/02/2017   Date   free T4   TSH   T3   04/02/2015   11.38   06/12/2015  6.02    08/10/2015    9.56    10/22/2015     4.99   04/23/2017   5.76    DIABETES: Type 2 diabetes  Diagnosis date:  07/2018  History:     She had gone to her PCP for annual exam in 7/20 and was found to have a glucose of 257 and baseline A1c of 10.4 She was sent to nutritionist, started on monitoring with a One Touch meter and also prescribed metformin 1 g twice daily  Non-insulin hypoglycemic drugs: Metformin 0.5 g a.m., 1 g at dinner, she was on Rybelsus 7 mg in a.m.  Side effects from medications:  Previously nausea from Rybelsus  Current self management, blood sugar patterns and problems identified:  Her A1c is most recently 6.9 compared to 6.7  Fructosamine last 227   . She has been unable to continue Rybelsus for almost 3 months now because of high co-pay . However her blood sugars do not appear to be significantly higher . She is however checking blood sugars very infrequently at home . Currently she is weighing about 323, previously 316 pounds . She is just starting to do some walking or other exercise 3 times a week at the gym . However she is inconsistent with the diet and eating some fried foods, sometimes getting more juice or snacks including at night . She has seen the dietitian  before but she did not feel like she benefited, she thinks she knows how to cut back on calories and carbohydrates and previously had taken care of her father with diabetes with diet management . Fasting readings are within target but she did have a high reading of 179 after supper Lab glucose was 148 fasting      Monitors blood glucose: Less than Once a day.    Glucometer: One Touch.           Blood Glucose readings from patient review:   PRE-MEAL Fasting Lunch Dinner Bedtime Overall  Glucose range:  124, 127      Mean/median:        POST-MEAL PC Breakfast PC Lunch PC Dinner  Glucose range:   113, 153  179  Mean/median:      Previous readings:  PRE-MEAL Fasting Lunch Dinner Bedtime Overall  Glucose range:  91-141  114, 153     Mean/median:        POST-MEAL PC Breakfast PC Lunch PC Dinner  Glucose range:   154   Mean/median:                             Dietician visit: Most recent:   10/07/2018   Weight :  Wt Readings from Last 3 Encounters:  10/18/18 (!) 345 lb (156.5 kg)  10/28/17 (!) 354 lb (160.6 kg)  08/11/17 (!) 360 lb 6.4 oz (163.5 kg)   Lab Results  Component Value Date   HGBA1C 6.9 (H) 06/15/2019   HGBA1C 6.7 (H) 12/31/2018   Lab Results  Component Value Date   MICROALBUR 6.6 (H) 12/31/2018   LDLCALC 101 (H) 06/15/2019   CREATININE 0.77 06/15/2019     History reviewed. No pertinent past medical history.  Past Surgical History:  Procedure Laterality Date  . CHOLECYSTECTOMY      History reviewed. No pertinent family history.  Social History:  reports that she has never smoked. She has never used smokeless tobacco. She reports that she does not drink alcohol or use drugs.  Allergies: No Known Allergies  Allergies as of 06/22/2019   No Known Allergies     Medication List       Accurate as of June 22, 2019  2:59 PM. If you have any questions, ask your nurse or doctor.        atorvastatin 10 MG tablet Commonly known as: LIPITOR Take 1 tablet  (10 mg total) by mouth daily.   levothyroxine 300 MCG tablet Commonly known as: SYNTHROID TAKE 1 TABLET DAILY BEFORE BREAKFAST What changed:   when to take this  additional instructions   lisinopril 30 MG tablet Commonly known as:  ZESTRIL Take 1 tablet (30 mg total) by mouth daily.   metFORMIN 500 MG tablet Commonly known as: GLUCOPHAGE Take 1 tablet  by mouth at breakfast and 2 tablets at dinner   Rybelsus 7 MG Tabs Generic drug: Semaglutide Take 1 tablet by mouth daily.          Review of Systems   LIPIDS: Treated with atorvastatin  LDL 145 at baseline Last lipids show LDL has gone up although triglycerides are better She said that she does not remember to take her atorvastatin regularly Also may be getting more fried food lately at times  Lab Results  Component Value Date   CHOL 166 06/15/2019   CHOL 133 12/31/2018   CHOL 122 11/12/2018   Lab Results  Component Value Date   HDL 36.50 (L) 06/15/2019   HDL 32.70 (L) 12/31/2018   HDL 29.70 (L) 11/12/2018   Lab Results  Component Value Date   LDLCALC 101 (H) 06/15/2019   LDLCALC 69 12/31/2018   LDLCALC 68 11/12/2018   Lab Results  Component Value Date   TRIG 140.0 06/15/2019   TRIG 161.0 (H) 12/31/2018   TRIG 120.0 11/12/2018   Lab Results  Component Value Date   CHOLHDL 5 06/15/2019   CHOLHDL 4 12/31/2018   CHOLHDL 4 11/12/2018   No results found for: LDLDIRECT  Last diabetic foot exam was in 07/2018   Treated with lisinopril for history of hypertension  BP Readings from Last 3 Encounters:  10/18/18 134/80  10/28/17 128/82  07/27/17 136/88      Examination:    There were no vitals taken for this visit.    Assessment:  HYPOTHYROIDISM, diagnosed in 2014 also and with no specific symptoms at baseline  Her levothyroxine requirement has been recently lower Now with taking the equivalent of about 214 mcg of levothyroxine daily her TSH is normal at 2.9 No symptoms of fatigue She  takes her levothyroxine as prescribed 5 days a week using the 300 mcg tablets  DIABETES with morbid obesity:  She is now only on Metformin Previously had lost more weight with Rybelsus which she says is too expensive now Also she thinks she is getting diarrhea with Metformin recently She has started exercise and is trying to do fairly well on the diet but not consistently  HYPERCHOLESTEROLEMIA: On Lipitor, LDL is higher from inconsistent compliance with her medication  Hypertension followed by PCP  PLAN:   Levothyroxine: She will take 2 tablets of the 112 mcg daily which will be approximately the same as her current regimen which is equivalent to 214 mcg daily   She needs to start watching her meals and avoid fried food and dairy choices when eating out  Increase walking for exercise  Try to get Rybelsus co-pay card again from the manufacturer website  To change to Metformin ER instead of regular Metformin to reduce diarrhea  Check more readings after dinner and especially with eating larger meals  Call if having consistently high readings  Follow-up in 3 months  There are no Patient Instructions on file for this visit.    Reather Littler 06/22/2019, 2:59 PM    Note: This office note was prepared with Dragon voice recognition system technology. Any transcriptional errors that result from this process are unintentional.

## 2019-06-23 MED ORDER — METFORMIN HCL ER 500 MG PO TB24: ORAL_TABLET | ORAL | 3 refills | Status: DC

## 2019-06-29 ENCOUNTER — Telehealth: Payer: Self-pay | Admitting: Endocrinology

## 2019-06-29 NOTE — Telephone Encounter (Signed)
She can come and get a sample of the 3 mg for 30 days since the medication is a capsule.  7 mg can be started after the 30 days

## 2019-06-29 NOTE — Telephone Encounter (Signed)
Patient requests to be called at ph# [phone] re: Patient was able to get a better cost for [facility]us at [facility], however she is only able to get the 7 mg of Ryblesus. Patient requests to be advised on how her dosage should be taken (ex: should patient start with 3 mg or should patient cut the 7 mg in half)? If patient should start with 3 mg of Rybelsus she requests a RX be sent to:  [id number] - [place], [place] - [address] AT [facility] [address] Phone:  [phone]  Fax:  [phone]

## 2019-06-29 NOTE — Telephone Encounter (Signed)
Called pt and made her aware of this. Pt verbalized understanding and stated that she would be by the office in the morning.

## 2019-06-30 ENCOUNTER — Telehealth: Payer: Self-pay

## 2019-06-30 ENCOUNTER — Other Ambulatory Visit: Payer: Self-pay

## 2019-06-30 NOTE — Telephone Encounter (Cosign Needed)
Pt came into office and picked up sample of Rybelsus 3mg  tablets per Dr. instructions on 06/29/2019. Medication was signed out according to sample medication policy.  Medication was labeled using approved label. Medication sample was added to patient's medication list as a sample medication that had been given. Medication was explained to patient and patient verbalized understanding of how to take properly. Pt was also instructed to call this office is any questions or concerns arise. Pt did verbalize understanding of this.  Lot number for medication is: 08/29/2019 Expiration for medication is: 01/21/2020

## 2019-06-30 NOTE — Telephone Encounter (Cosign Needed)
error 

## 2019-07-11 ENCOUNTER — Other Ambulatory Visit: Payer: Self-pay | Admitting: Family Medicine

## 2019-07-11 ENCOUNTER — Other Ambulatory Visit (HOSPITAL_COMMUNITY)
Admission: RE | Admit: 2019-07-11 | Discharge: 2019-07-11 | Disposition: A | Discharge status: Home / Self Care | Payer: BC Managed Care – PPO | Source: Ambulatory Visit | Attending: Family Medicine | Admitting: Family Medicine

## 2019-07-11 DIAGNOSIS — Z124 Encounter for screening for malignant neoplasm of cervix: Secondary | ICD-10-CM | POA: Insufficient documentation

## 2019-07-11 DIAGNOSIS — Z0000 Encounter for general adult medical examination without abnormal findings: Secondary | ICD-10-CM | POA: Diagnosis not present

## 2019-07-11 DIAGNOSIS — Z23 Encounter for immunization: Secondary | ICD-10-CM | POA: Diagnosis not present

## 2019-07-12 LAB — CYTOLOGY - PAP: Diagnosis: NEGATIVE

## 2019-07-19 ENCOUNTER — Telehealth: Payer: Self-pay | Admitting: Endocrinology

## 2019-07-19 ENCOUNTER — Other Ambulatory Visit: Payer: Self-pay

## 2019-07-19 MED ORDER — METFORMIN HCL 500 MG PO TABS: ORAL_TABLET | ORAL | 1 refills | Status: DC

## 2019-07-19 NOTE — Telephone Encounter (Signed)
Patient called stating the metformin XR are too large and she prefers to take the regular metformin. Patient would not like any refills for the XR. Patient ph# [phone]

## 2019-07-19 NOTE — Telephone Encounter (Signed)
Medication changed from Metformin XR to regular Metformin.  Called pt and notified her of this and also sent new Rx for regular metformin to [facility].

## 2019-07-19 NOTE — Telephone Encounter (Signed)
Not clear if she is requesting refills for the regular Metformin, this is okay

## 2019-09-23 ENCOUNTER — Other Ambulatory Visit (INDEPENDENT_AMBULATORY_CARE_PROVIDER_SITE_OTHER): Payer: BC Managed Care – PPO

## 2019-09-23 ENCOUNTER — Other Ambulatory Visit: Payer: Self-pay

## 2019-09-23 DIAGNOSIS — E782 Mixed hyperlipidemia: Secondary | ICD-10-CM

## 2019-09-23 DIAGNOSIS — E1165 Type 2 diabetes mellitus with hyperglycemia: Secondary | ICD-10-CM

## 2019-09-23 LAB — LIPID PANEL
Cholesterol: 148 mg/dL (ref 0–200)
HDL: 33.6 mg/dL — ABNORMAL LOW (ref >39.00)
LDL Cholesterol: 83 mg/dL (ref 0–99)
NonHDL: 114.12
Total CHOL/HDL Ratio: 4
Triglycerides: 156 mg/dL — ABNORMAL HIGH (ref 0.0–149.0)
VLDL: 31.2 mg/dL (ref 0.0–40.0)

## 2019-09-23 LAB — HEMOGLOBIN A1C: Hgb A1c MFr Bld: 6.2 % (ref 4.6–6.5)

## 2019-09-23 LAB — MICROALBUMIN / CREATININE URINE RATIO
Creatinine,U: 126.3 mg/dL
Microalb Creat Ratio: 0.6 mg/g (ref 0.0–30.0)
Microalb, Ur: <0.7 mg/dL (ref 0.0–1.9)

## 2019-09-23 LAB — COMPREHENSIVE METABOLIC PANEL
ALT: 17 U/L (ref 0–35)
AST: 17 U/L (ref 0–37)
Albumin: 4.1 g/dL (ref 3.5–5.2)
Alkaline Phosphatase: 45 U/L (ref 39–117)
BUN: 14 mg/dL (ref 6–23)
CO2: 27 mEq/L (ref 19–32)
Calcium: 8.9 mg/dL (ref 8.4–10.5)
Chloride: 104 mEq/L (ref 96–112)
Creatinine, Ser: 0.78 mg/dL (ref 0.40–1.20)
GFR: 80 mL/min (ref >60.00)
Glucose, Bld: 110 mg/dL — ABNORMAL HIGH (ref 70–99)
Potassium: 4.7 mEq/L (ref 3.5–5.1)
Sodium: 140 mEq/L (ref 135–145)
Total Bilirubin: 0.5 mg/dL (ref 0.2–1.2)
Total Protein: 6.7 g/dL (ref 6.0–8.3)

## 2019-09-28 ENCOUNTER — Telehealth: Payer: BC Managed Care – PPO | Admitting: Endocrinology

## 2019-09-28 ENCOUNTER — Other Ambulatory Visit: Payer: Self-pay

## 2019-09-28 ENCOUNTER — Ambulatory Visit (INDEPENDENT_AMBULATORY_CARE_PROVIDER_SITE_OTHER): Payer: BC Managed Care – PPO | Admitting: Endocrinology

## 2019-09-28 VITALS — BP 128/70 | HR 92 | Ht 67.01 in | Wt 318.0 lb

## 2019-09-28 DIAGNOSIS — E039 Hypothyroidism, unspecified: Secondary | ICD-10-CM

## 2019-09-28 DIAGNOSIS — E1169 Type 2 diabetes mellitus with other specified complication: Secondary | ICD-10-CM | POA: Diagnosis not present

## 2019-09-28 DIAGNOSIS — E782 Mixed hyperlipidemia: Secondary | ICD-10-CM | POA: Diagnosis not present

## 2019-09-28 NOTE — Progress Notes (Signed)
Patient ID: Joan Walker, female   DOB: [birth year], 44 y.o.   MRN: [id number]           Referring Physician: Lupe Carney  Reason for Appointment: Endocrinology follow-up    History of Present Illness:   Hypothyroidism was first diagnosed in ?  2015  At the time of diagnosis patient was getting a routine lab test for her annual exam but not having any symptoms of  fatigue, cold sensitivity, difficulty concentrating, dry skin, weight gain or hair loss.  Details of her prior treatments are not available except results pending 2017 when her thyroid supplement was that 150 mcg daily Subsequently her doses had been progressively increased and apparently in 2018 she was taking 200 mcg daily but no labs from 2018 are available Other labs indicate persistently high TSH levels since 2017  RECENT history: TSH on her initial consultation 5/19 was 7.7 when she was taking 250 mcg daily At that time she was complaining of feeling tired periodically Initially dose was increased to 300 mcg levothyroxine which improved her energy level  However subsequently as needed gradual reduction of her supplement Previously taking the 300 mcg dose 5 days a week and with this her thyroid levels were normal Now for convenience is taking 112 mcg, 2 tablets daily every day  She is taking the levothyroxine supplement well before breakfast on empty stomach  She feels fairly good  Her TSH is last normal at 2.9          Patient's weight history is as follows:  Wt Readings from Last 3 Encounters:  09/28/19 (!) 318 lb (144.2 kg)  10/18/18 (!) 345 lb (156.5 kg)  10/28/17 (!) 354 lb (160.6 kg)    Thyroid function results have been as follows:  Lab Results  Component Value Date   TSH 2.90 06/15/2019   TSH 0.22 (L) 03/18/2019   TSH 0.77 12/31/2018   TSH 0.03 (L) 11/12/2018   FREET4 1.20 06/15/2019   FREET4 1.15 12/31/2018   FREET4 1.37 10/14/2018   FREET4 1.30 04/06/2018   T3FREE 4.0 07/22/2017    T3FREE 4.1 06/02/2017   Date   free T4   TSH   T3   04/02/2015   11.38   06/12/2015  6.02    08/10/2015    9.56    10/22/2015     4.99   04/23/2017   5.76    DIABETES: Type 2 diabetes  Diagnosis date:  07/2018  History:     She had gone to her PCP for annual exam in 7/20 and was found to have a glucose of 257 and baseline A1c of 10.4 She was sent to nutritionist, started on monitoring with a One Touch meter and also prescribed metformin 1 g twice daily  Non-insulin hypoglycemic drugs: Metformin 0.5 g a.m., 1 g at dinner, on Rybelsus 3 mg in a.m.           Side effects from medications:  Mild diarrhea from Metformin  Current self management, blood sugar patterns and problems identified:  Her A1c is most recently 6.2, previously 6.9  Fructosamine last 227   . She has been able to resume her Rybelsus after her last visit in June . She went up to 7 mg after starting on 3 mg and she confirmed this. . Usually no nausea with this . She thinks that she is not getting hungry in between meals although occasionally may have a snack or sweets in the afternoon causing her sugar  to be higher . She appears to have lost 5 pounds recently although she has lost about 25 pounds since last year . She is doing some aerobic exercise at times and occasionally will go to the gym also . She may still get occasional diarrhea from Metformin, does not like the extended release preparation because of the large size No blood sugars done after dinner      Monitors blood glucose: Less than Once a day.    Glucometer: One Touch.           Blood Glucose readings from review of her record on the phone app:   PRE-MEAL Fasting Lunch Dinner Bedtime Overall  Glucose range:  95-133   88, 165    Mean/median:     ?   POST-MEAL PC Breakfast PC Lunch PC Dinner  Glucose range:    Not done  Mean/median:      Previous data:  PRE-MEAL Fasting Lunch Dinner Bedtime Overall  Glucose range:  124, 127      Mean/median:         POST-MEAL PC Breakfast PC Lunch PC Dinner  Glucose range:   113, 153  179  Mean/median:                 Dietician visit: Most recent:   10/07/2018   Weight :  Wt Readings from Last 3 Encounters:  09/28/19 (!) 318 lb (144.2 kg)  10/18/18 (!) 345 lb (156.5 kg)  10/28/17 (!) 354 lb (160.6 kg)   Lab Results  Component Value Date   HGBA1C 6.2 09/23/2019   HGBA1C 6.9 (H) 06/15/2019   HGBA1C 6.7 (H) 12/31/2018   Lab Results  Component Value Date   MICROALBUR <0.7 09/23/2019   LDLCALC 83 09/23/2019   CREATININE 0.78 09/23/2019     No past medical history on file.  Past Surgical History:  Procedure Laterality Date  . CHOLECYSTECTOMY      No family history on file.  Social History:  reports that she has never smoked. She has never used smokeless tobacco. She reports that she does not drink alcohol and does not use drugs.  Allergies: No Known Allergies  Allergies as of 09/28/2019   No Known Allergies     Medication List       Accurate as of September 28, 2019  9:04 PM. If you have any questions, ask your nurse or doctor.        atorvastatin 10 MG tablet Commonly known as: LIPITOR Take 1 tablet (10 mg total) by mouth daily.   levothyroxine 112 MCG tablet Commonly known as: SYNTHROID Take 2 tablets (224 mcg total) by mouth daily.   lisinopril 30 MG tablet Commonly known as: ZESTRIL Take 1 tablet (30 mg total) by mouth daily.   metFORMIN 500 MG tablet Commonly known as: GLUCOPHAGE Take 1 tablet by mouth daily with breakfast, and 2 tablets at supper. D/c Metformin XR   Rybelsus 3 MG Tabs Generic drug: Semaglutide Take 1 tablet by mouth daily. Take 1 tablet by mouth once daily.          Review of Systems   LIPIDS: Treated with atorvastatin  LDL 145 at baseline It is now down below 100 and triglycerides are 156  Lab Results  Component Value Date   CHOL 148 09/23/2019   CHOL 166 06/15/2019   CHOL 133 12/31/2018   Lab Results  Component Value  Date   HDL 33.60 (L) 09/23/2019   HDL 36.50 (L) 06/15/2019  HDL 32.70 (L) 12/31/2018   Lab Results  Component Value Date   LDLCALC 83 09/23/2019   LDLCALC 101 (H) 06/15/2019   LDLCALC 69 12/31/2018   Lab Results  Component Value Date   TRIG 156.0 (H) 09/23/2019   TRIG 140.0 06/15/2019   TRIG 161.0 (H) 12/31/2018   Lab Results  Component Value Date   CHOLHDL 4 09/23/2019   CHOLHDL 5 06/15/2019   CHOLHDL 4 12/31/2018   No results found for: LDLDIRECT  Last diabetic foot exam was in 07/2018   Treated with lisinopril 30 mg for history of hypertension  Blood pressure was higher on first measurement  BP Readings from Last 3 Encounters:  09/28/19 128/70  10/18/18 134/80  10/28/17 128/82      Examination:    BP 128/70 (BP Location: Left Arm, Patient Position: Sitting)   Pulse 92   Ht 5' 7.01" (1.702 m)   Wt (!) 318 lb (144.2 kg)   SpO2 99%   BMI 49.79 kg/m     Assessment:  HYPOTHYROIDISM, diagnosed in 2014 also and with no specific symptoms at baseline  Her levothyroxine dose is now 224 mcg daily  DIABETES with morbid obesity:  She is now on Metformin and Rybelsus She has a little better blood sugar control with adding Rybelsus and starting to lose a little weight However likely can do better with exercise Only occasional nausea with this Lowest blood sugar 88 and reassured her that this is not low Ideal blood glucose reading after meals would be 160 or less  HYPERCHOLESTEROLEMIA: On Lipitor, LDL is better controlled with improved compliance with her atorvastatin now Can do a little better with lowering triglycerides, HDL is low  Hypertension: Well-controlled with lisinopril and blood pressure was normal on second measurement   PLAN:   Check TSH on the next visit   She needs to start watching her diet and exercise more regularly  Encouraged her to check readings after meals consistently and bring her monitor for download on each visit  To call  if blood sugars are starting to rise  No change in atorvastatin, lisinopril or Metformin  Follow-up in 3 months  There are no Patient Instructions on file for this visit.    Reather Littler 09/28/2019, 9:04 PM    Note: This office note was prepared with Dragon voice recognition system technology. Any transcriptional errors that result from this process are unintentional.

## 2019-10-02 ENCOUNTER — Other Ambulatory Visit: Payer: Self-pay | Admitting: Endocrinology

## 2019-11-01 ENCOUNTER — Other Ambulatory Visit: Payer: Self-pay | Admitting: *Deleted

## 2019-11-01 MED ORDER — RYBELSUS 7 MG PO TABS: 1.0000 | ORAL_TABLET | Freq: Every day | ORAL | 3 refills | Status: DC

## 2019-11-08 ENCOUNTER — Telehealth: Payer: Self-pay | Admitting: Endocrinology

## 2019-11-08 NOTE — Telephone Encounter (Signed)
Please advise. Thanks.  

## 2019-11-08 NOTE — Telephone Encounter (Signed)
She can take 1/2 tablet for 1 week and then full tablet of the 7 mg

## 2019-11-08 NOTE — Telephone Encounter (Signed)
Patient called to advise that she just received her Rybelsus. She has been off the medication for 7 days.  Is asking if she should start with the 7 MG or start with 3 MG and then increase over time.    Please advise her by calling [phone]

## 2019-11-09 NOTE — Telephone Encounter (Signed)
Called, LVM advising patient of the message from MD.  Left call back number if questions or concerns.

## 2019-11-22 DIAGNOSIS — S0502XA Injury of conjunctiva and corneal abrasion without foreign body, left eye, initial encounter: Secondary | ICD-10-CM | POA: Diagnosis not present

## 2019-11-23 DIAGNOSIS — H16042 Marginal corneal ulcer, left eye: Secondary | ICD-10-CM | POA: Diagnosis not present

## 2019-11-24 DIAGNOSIS — H16042 Marginal corneal ulcer, left eye: Secondary | ICD-10-CM | POA: Diagnosis not present

## 2019-11-25 ENCOUNTER — Other Ambulatory Visit: Payer: Self-pay | Admitting: Endocrinology

## 2019-12-06 DIAGNOSIS — E119 Type 2 diabetes mellitus without complications: Secondary | ICD-10-CM | POA: Diagnosis not present

## 2019-12-25 ENCOUNTER — Other Ambulatory Visit: Payer: Self-pay | Admitting: Endocrinology

## 2020-01-10 ENCOUNTER — Other Ambulatory Visit (INDEPENDENT_AMBULATORY_CARE_PROVIDER_SITE_OTHER): Payer: BC Managed Care – PPO

## 2020-01-10 ENCOUNTER — Other Ambulatory Visit: Payer: Self-pay

## 2020-01-10 DIAGNOSIS — E039 Hypothyroidism, unspecified: Secondary | ICD-10-CM

## 2020-01-10 DIAGNOSIS — E1169 Type 2 diabetes mellitus with other specified complication: Secondary | ICD-10-CM | POA: Diagnosis not present

## 2020-01-10 LAB — TSH: TSH: <0.01 u[IU]/mL — ABNORMAL LOW (ref 0.35–4.50)

## 2020-01-10 LAB — T4, FREE: Free T4: 1.52 ng/dL (ref 0.60–1.60)

## 2020-01-10 LAB — COMPREHENSIVE METABOLIC PANEL
ALT: 17 U/L (ref 0–35)
AST: 16 U/L (ref 0–37)
Albumin: 4 g/dL (ref 3.5–5.2)
Alkaline Phosphatase: 65 U/L (ref 39–117)
BUN: 16 mg/dL (ref 6–23)
CO2: 30 mEq/L (ref 19–32)
Calcium: 9.1 mg/dL (ref 8.4–10.5)
Chloride: 103 mEq/L (ref 96–112)
Creatinine, Ser: 0.74 mg/dL (ref 0.40–1.20)
GFR: 98.05 mL/min (ref >60.00)
Glucose, Bld: 142 mg/dL — ABNORMAL HIGH (ref 70–99)
Potassium: 4.2 mEq/L (ref 3.5–5.1)
Sodium: 138 mEq/L (ref 135–145)
Total Bilirubin: 0.4 mg/dL (ref 0.2–1.2)
Total Protein: 7 g/dL (ref 6.0–8.3)

## 2020-01-10 LAB — HEMOGLOBIN A1C: Hgb A1c MFr Bld: 6.5 % (ref 4.6–6.5)

## 2020-01-12 ENCOUNTER — Other Ambulatory Visit: Payer: Self-pay | Admitting: Endocrinology

## 2020-01-12 ENCOUNTER — Other Ambulatory Visit: Payer: Self-pay

## 2020-01-12 ENCOUNTER — Encounter: Payer: Self-pay | Admitting: Endocrinology

## 2020-01-12 ENCOUNTER — Ambulatory Visit (INDEPENDENT_AMBULATORY_CARE_PROVIDER_SITE_OTHER): Payer: BC Managed Care – PPO | Admitting: Endocrinology

## 2020-01-12 VITALS — BP 128/86 | HR 92 | Ht 67.0 in | Wt 310.6 lb

## 2020-01-12 DIAGNOSIS — E1169 Type 2 diabetes mellitus with other specified complication: Secondary | ICD-10-CM

## 2020-01-12 DIAGNOSIS — E039 Hypothyroidism, unspecified: Secondary | ICD-10-CM | POA: Diagnosis not present

## 2020-01-12 MED ORDER — LEVOTHYROXINE SODIUM 200 MCG PO TABS: ORAL_TABLET | ORAL | 1 refills | Status: DC

## 2020-01-12 NOTE — Progress Notes (Signed)
Patient ID: AKIVA JOSEY, female   DOB: January 21, 1976, 44 y.o.   MRN: [id number]           Referring Physician: Lupe Carney  Reason for Appointment: Endocrinology follow-up    History of Present Illness:   Hypothyroidism was first diagnosed in ?  2015  At the time of diagnosis patient was getting a routine lab test for her annual exam but not having any symptoms of  fatigue, cold sensitivity, difficulty concentrating, dry skin, weight gain or hair loss.  Details of her prior treatments are not available except results pending 2017 when her thyroid supplement was that 150 mcg daily Subsequently her doses had been progressively increased and apparently in 2018 she was taking 200 mcg daily but no labs from 2018 are available Other labs indicate persistently high TSH levels since 2017  RECENT history: TSH on her initial consultation 5/19 was 7.7 when she was taking 250 mcg daily At that time she was complaining of feeling tired periodically Initially dose was increased to 300 mcg levothyroxine which improved her energy level  However subsequently has needed gradual reduction of her dosage She has been taking LEVOTHYROXINE 112 mcg, 2 tablets daily every day  She is taking the levothyroxine supplement before breakfast and is quite regular with this No complaints of fatigue Also not feeling jittery or shaky, no palpitations  Her TSH was last normal at 2.9 and is now undetectable with high normal free T4          Patient's weight history is as follows:  Wt Readings from Last 3 Encounters:  01/12/20 (!) 310 lb 9.6 oz (140.9 kg)  09/28/19 (!) 318 lb (144.2 kg)  10/18/18 (!) 345 lb (156.5 kg)    Thyroid function results have been as follows:  Lab Results  Component Value Date   TSH <0.01 Repeated and verified X2. (L) 01/10/2020   TSH 2.90 06/15/2019   TSH 0.22 (L) 03/18/2019   TSH 0.77 12/31/2018   FREET4 1.52 01/10/2020   FREET4 1.20 06/15/2019   FREET4 1.15 12/31/2018    FREET4 1.37 10/14/2018   T3FREE 4.0 07/22/2017   T3FREE 4.1 06/02/2017   Date   free T4   TSH   T3   04/02/2015   11.38   06/12/2015  6.02    08/10/2015    9.56    10/22/2015     4.99   04/23/2017   5.76    DIABETES: Type 2 diabetes  Diagnosis date:  07/2018  History:     She had gone to her PCP for annual exam in 7/20 and was found to have a glucose of 257 and baseline A1c of 10.4 She was sent to nutritionist, started on monitoring with a One Touch meter and also prescribed metformin 1 g twice daily  Non-insulin hypoglycemic drugs: Metformin 0.5 g a.m., 1 g at dinner, on Rybelsus 7 mg in a.m.           Side effects from medications:  Mild diarrhea from Metformin  Current self management, blood sugar patterns and problems identified:  Her A1c is most recently 6.5, previously 6.2  Fructosamine last 227   . She has been able to continue 7 mg of her Rybelsus . She says she will have no nausea if she wakes at least 40 minutes after taking the medication to eat breakfast . Also diarrhea is less with Metformin . However she has been forgetting to check blood sugars and has only 3 readings in  the last month . She thinks she is watching her overall calories . However her blood sugar was 142 fasting and she had ice cream the night before No blood sugars done before breakfast      Monitors blood glucose: Less than Once a day.    Glucometer: One Touch.           Blood Glucose readings from review of her record on the phone app:  After lunch 166, after dinner 164 and before dinner 128  PREVIOUSLY:  PRE-MEAL Fasting Lunch Dinner Bedtime Overall  Glucose range:  95-133   88, 165    Mean/median:     ?   POST-MEAL PC Breakfast PC Lunch PC Dinner  Glucose range:    Not done  Mean/median:                 Dietician visit: Most recent:   10/07/2018   Weight history:  Wt Readings from Last 3 Encounters:  01/12/20 (!) 310 lb 9.6 oz (140.9 kg)  09/28/19 (!) 318 lb (144.2 kg)   10/18/18 (!) 345 lb (156.5 kg)   Lab Results  Component Value Date   HGBA1C 6.5 01/10/2020   HGBA1C 6.2 09/23/2019   HGBA1C 6.9 (H) 06/15/2019   Lab Results  Component Value Date   MICROALBUR <0.7 09/23/2019   LDLCALC 83 09/23/2019   CREATININE 0.74 01/10/2020     No past medical history on file.  Past Surgical History:  Procedure Laterality Date  . CHOLECYSTECTOMY      No family history on file.  Social History:  reports that she has never smoked. She has never used smokeless tobacco. She reports that she does not drink alcohol and does not use drugs.  Allergies:  Allergies  Allergen Reactions  . Other Swelling    Allergies as of 01/12/2020      Reactions   Other Swelling      Medication List       Accurate as of January 12, 2020  1:54 PM. If you have any questions, ask your nurse or doctor.        atorvastatin 10 MG tablet Commonly known as: LIPITOR TAKE 1 TABLET DAILY   levothyroxine 200 MCG tablet Commonly known as: Synthroid 1 tablet before breakfast daily except half tablet on Sundays What changed:   medication strength  how much to take  how to take this  when to take this  additional instructions Changed by: Reather Littler, MD   lisinopril 30 MG tablet Commonly known as: ZESTRIL Take 1 tablet (30 mg total) by mouth daily.   metFORMIN 500 MG tablet Commonly known as: GLUCOPHAGE Take 1 tablet by mouth daily with breakfast, and 2 tablets at supper. D/c Metformin XR   Rybelsus 7 MG Tabs Generic drug: Semaglutide TAKE 1 TABLET DAILY What changed: additional instructions Changed by: Reather Littler, MD          Review of Systems   LIPIDS: Treated with atorvastatin  LDL 145 at baseline It is now down below 100 and triglycerides are 156  Lab Results  Component Value Date   CHOL 148 09/23/2019   CHOL 166 06/15/2019   CHOL 133 12/31/2018   Lab Results  Component Value Date   HDL 33.60 (L) 09/23/2019   HDL 36.50 (L) 06/15/2019    HDL 32.70 (L) 12/31/2018   Lab Results  Component Value Date   LDLCALC 83 09/23/2019   LDLCALC 101 (H) 06/15/2019   LDLCALC 69 12/31/2018  Lab Results  Component Value Date   TRIG 156.0 (H) 09/23/2019   TRIG 140.0 06/15/2019   TRIG 161.0 (H) 12/31/2018   Lab Results  Component Value Date   CHOLHDL 4 09/23/2019   CHOLHDL 5 06/15/2019   CHOLHDL 4 12/31/2018   No results found for: LDLDIRECT  Last diabetic foot exam was in 07/2018   Treated with lisinopril 30 mg for history of hypertension  Blood pressure was higher on first measurement  BP Readings from Last 3 Encounters:  01/12/20 128/86  09/28/19 128/70  10/18/18 134/80      Examination:    BP 128/86   Pulse 92   Ht 5\' 7"  (1.702 m)   Wt (!) 310 lb 9.6 oz (140.9 kg)   SpO2 95%   BMI 48.65 kg/m   Diabetic Foot Exam - Simple   Simple Foot Form Diabetic Foot exam was performed with the following findings: Yes   Visual Inspection See comments: Yes Sensation Testing Intact to touch and monofilament testing bilaterally: Yes Pulse Check See comments: Yes Comments Callus formation on distal feet especially right midline Pedal pulses 2/4     She has excess adipose tissue above the ankles without pitting edema  Assessment:  HYPOTHYROIDISM, diagnosed in 2014 also and with no specific symptoms at baseline  Her levothyroxine dose is recently 224 mcg daily Again she appears to be requiring less medication possibly from weight loss TSH is now suppressed although she is asymptomatic  DIABETES with morbid obesity:  She is now on Metformin and Rybelsus 7 mg daily Tolerating both without side effects  She has lost weight and likely benefiting from Rybelsus Also is trying to exercise although recently irregular with this Glucose monitoring can be much better as she is doing it only infrequently This may also help her with consistent compliance with diet especially with checking postprandial  readings  HYPERCHOLESTEROLEMIA: Controlled as of 9/21  Hypertension: Well-controlled with lisinopril PLAN:   LEVOTHYROXINE 200 mcg 6-1/2 tablets a week   No change in Rybelsus or Metformin  Encouraged her to check readings after meals consistently but also occasionally in the morning; given her information on linking her pump to the One Touch app online  To call if blood sugars are consistently abnormal  Regular aerobic exercise 5 days a week  Follow-up in 3 months  Patient Instructions  Take 2 of 112ug Synthroid and 1 on weekends  Check blood sugars on waking up 2-3 days a week  Also check blood sugars about 2 hours after meals and do this after different meals by rotation  Recommended blood sugar levels on waking up are 90-130 and about 2 hours after meal is 130-160  Please bring your blood sugar monitor to each visit, thank you       10/21 01/12/2020, 1:54 PM    Note: This office note was prepared with Dragon voice recognition system technology. Any transcriptional errors that result from this process are unintentional.

## 2020-01-12 NOTE — Patient Instructions (Addendum)
Take 2 of 112ug Synthroid and 1 on weekends  Check blood sugars on waking up 2-3 days a week  Also check blood sugars about 2 hours after meals and do this after different meals by rotation  Recommended blood sugar levels on waking up are 90-130 and about 2 hours after meal is 130-160  Please bring your blood sugar monitor to each visit, thank you

## 2020-01-16 ENCOUNTER — Other Ambulatory Visit: Payer: Self-pay | Admitting: Endocrinology

## 2020-02-03 DIAGNOSIS — Z20822 Contact with and (suspected) exposure to covid-19: Secondary | ICD-10-CM | POA: Diagnosis not present

## 2020-02-03 DIAGNOSIS — U071 COVID-19: Secondary | ICD-10-CM | POA: Diagnosis not present

## 2020-04-17 ENCOUNTER — Other Ambulatory Visit: Payer: BC Managed Care – PPO

## 2020-04-19 ENCOUNTER — Ambulatory Visit: Payer: BC Managed Care – PPO | Admitting: Endocrinology

## 2020-04-19 DIAGNOSIS — M25562 Pain in left knee: Secondary | ICD-10-CM | POA: Diagnosis not present

## 2020-05-07 ENCOUNTER — Other Ambulatory Visit (INDEPENDENT_AMBULATORY_CARE_PROVIDER_SITE_OTHER): Payer: BC Managed Care – PPO

## 2020-05-07 ENCOUNTER — Other Ambulatory Visit: Payer: Self-pay

## 2020-05-07 DIAGNOSIS — E1169 Type 2 diabetes mellitus with other specified complication: Secondary | ICD-10-CM

## 2020-05-07 DIAGNOSIS — E039 Hypothyroidism, unspecified: Secondary | ICD-10-CM

## 2020-05-07 LAB — BASIC METABOLIC PANEL
BUN: 13 mg/dL (ref 6–23)
CO2: 27 mEq/L (ref 19–32)
Calcium: 9.1 mg/dL (ref 8.4–10.5)
Chloride: 105 mEq/L (ref 96–112)
Creatinine, Ser: 0.83 mg/dL (ref 0.40–1.20)
GFR: 85.24 mL/min (ref >60.00)
Glucose, Bld: 116 mg/dL — ABNORMAL HIGH (ref 70–99)
Potassium: 4.5 mEq/L (ref 3.5–5.1)
Sodium: 140 mEq/L (ref 135–145)

## 2020-05-07 LAB — HEMOGLOBIN A1C: Hgb A1c MFr Bld: 6.7 % — ABNORMAL HIGH (ref 4.6–6.5)

## 2020-05-07 LAB — TSH: TSH: 10.88 u[IU]/mL — ABNORMAL HIGH (ref 0.35–4.50)

## 2020-05-07 LAB — T4, FREE: Free T4: 0.77 ng/dL (ref 0.60–1.60)

## 2020-05-10 ENCOUNTER — Ambulatory Visit (INDEPENDENT_AMBULATORY_CARE_PROVIDER_SITE_OTHER): Payer: BC Managed Care – PPO | Admitting: Endocrinology

## 2020-05-10 ENCOUNTER — Other Ambulatory Visit: Payer: Self-pay

## 2020-05-10 ENCOUNTER — Encounter: Payer: Self-pay | Admitting: Endocrinology

## 2020-05-10 VITALS — BP 118/82 | HR 66 | Ht 67.0 in | Wt 319.4 lb

## 2020-05-10 DIAGNOSIS — E1165 Type 2 diabetes mellitus with hyperglycemia: Secondary | ICD-10-CM

## 2020-05-10 DIAGNOSIS — E063 Autoimmune thyroiditis: Secondary | ICD-10-CM

## 2020-05-10 DIAGNOSIS — E782 Mixed hyperlipidemia: Secondary | ICD-10-CM

## 2020-05-10 NOTE — Patient Instructions (Addendum)
Walk 10-15 min briskly daily  Check blood sugars on waking up 2-3 days a week  Also check blood sugars about 2 hours after meals and do this after different meals by rotation  Recommended blood sugar levels on waking up are 90-130 and about 2 hours after meal is 130-160  Please bring your blood sugar monitor to each visit, thank you  Thyroid 6 days a week

## 2020-05-10 NOTE — Progress Notes (Signed)
Patient ID: Joan Walker, female   DOB: 08-02-1975, 45 y.o.   MRN: [id number]           Referring Physician: Lupe Carney  Reason for Appointment: Endocrinology follow-up    History of Present Illness:   Hypothyroidism was first diagnosed in ?  2015  At the time of diagnosis patient was getting a routine lab test for her annual exam but not having any symptoms of  fatigue, cold sensitivity, difficulty concentrating, dry skin, weight gain or hair loss.  Details of her prior treatments are not available except results pending 2017 when her thyroid supplement was that 150 mcg daily Subsequently her doses had been progressively increased and apparently in 2018 she was taking 200 mcg daily but no labs from 2018 are available Other labs indicate persistently high TSH levels since 2017  RECENT history: TSH on her initial consultation 5/19 was 7.7 when she was taking 250 mcg daily At that time she was complaining of feeling tired periodically Initially dose was increased to 300 mcg levothyroxine which improved her energy level  However subsequently has needed gradual reduction of her dosage She had been taking LEVOTHYROXINE 112 mcg, 2 tablets daily every day Because of her low TSH she was told to take 200 mcg of levothyroxine 6-1/2 tablets a week However she misunderstood and is taking this only 5 days a week  She is taking the levothyroxine supplement before breakfast and is quite regular with this She does not complain of fatigue but she thinks she is busy with working 2 jobs Weight has gone up  Her TSH was previously normal at 2.9 in 5/21 This is now 10.9 compared to 0 on the last visit Also has low normal free T4          Patient's weight history is as follows:  Wt Readings from Last 3 Encounters:  05/10/20 (!) 319 lb 6.4 oz (144.9 kg)  01/12/20 (!) 310 lb 9.6 oz (140.9 kg)  09/28/19 (!) 318 lb (144.2 kg)    Thyroid function results have been as follows:  Lab Results   Component Value Date   TSH 10.88 (H) 05/07/2020   TSH <0.01 Repeated and verified X2. (L) 01/10/2020   TSH 2.90 06/15/2019   TSH 0.22 (L) 03/18/2019   FREET4 0.77 05/07/2020   FREET4 1.52 01/10/2020   FREET4 1.20 06/15/2019   FREET4 1.15 12/31/2018   T3FREE 4.0 07/22/2017   T3FREE 4.1 06/02/2017   Date   free T4   TSH   T3   04/02/2015   11.38   06/12/2015  6.02    08/10/2015    9.56    10/22/2015     4.99   04/23/2017   5.76    DIABETES: Type 2 diabetes  Diagnosis date:  07/2018  History:     She had gone to her PCP for annual exam in 7/20 and was found to have a glucose of 257 and baseline A1c of 10.4 She was sent to nutritionist, started on monitoring with a One Touch meter and also prescribed metformin 1 g twice daily  Non-insulin hypoglycemic drugs: Metformin 0.5 g a.m., 1 g at dinner, on Rybelsus 7 mg in a.m.           Side effects from medications:  Mild diarrhea from Metformin  Current self management, blood sugar patterns and problems identified:  Her A1c is most recently 6.7  Fructosamine last 227   . She has a gradually increasing A1c .  Her weight has gone up slightly . Not doing any formal exercise . Also getting more sweets lately and she deals with stress with eating chocolate . Blood sugars have been monitored with the [employer] meter now and has a few readings after meals . Highest reading 177 and this is after dinner . Highest morning reading 153 recently  No blood sugars done before breakfast      Monitors blood glucose: Less than Once a day.    Glucometer: One Touch.           Blood Glucose readings from review of her meter   PRE-MEAL Fasting Lunch Dinner Bedtime Overall  Glucose range:  129-153   129   111-225  Mean/median:  150     148   POST-MEAL PC Breakfast PC Lunch PC Dinner  Glucose range:    124-177  Mean/median:    152   Previously:  After lunch 166, after dinner 164 and before dinner 128             Dietician visit: Most  recent:   10/07/2018   Weight history:  Wt Readings from Last 3 Encounters:  05/10/20 (!) 319 lb 6.4 oz (144.9 kg)  01/12/20 (!) 310 lb 9.6 oz (140.9 kg)  09/28/19 (!) 318 lb (144.2 kg)   Lab Results  Component Value Date   HGBA1C 6.7 (H) 05/07/2020   HGBA1C 6.5 01/10/2020   HGBA1C 6.2 09/23/2019   Lab Results  Component Value Date   MICROALBUR <0.7 09/23/2019   LDLCALC 83 09/23/2019   CREATININE 0.83 05/07/2020     No past medical history on file.  Past Surgical History:  Procedure Laterality Date  . CHOLECYSTECTOMY      No family history on file.  Social History:  reports that she has never smoked. She has never used smokeless tobacco. She reports that she does not drink alcohol and does not use drugs.  Allergies:  Allergies  Allergen Reactions  . Other Swelling    Allergies as of 05/10/2020      Reactions   Other Swelling      Medication List       Accurate as of May 10, 2020  8:46 AM. If you have any questions, ask your nurse or doctor.        atorvastatin 10 MG tablet Commonly known as: LIPITOR TAKE 1 TABLET DAILY   ibuprofen 200 MG tablet Commonly known as: ADVIL Take 200 mg by mouth every 6 (six) hours as needed.   levothyroxine 200 MCG tablet Commonly known as: Synthroid 1 tablet before breakfast daily except half tablet on Sundays   lisinopril 30 MG tablet Commonly known as: ZESTRIL Take 1 tablet (30 mg total) by mouth daily.   metFORMIN 500 MG tablet Commonly known as: GLUCOPHAGE TAKE 1 TABLET DAILY WITH BREAKFAST, AND 2 TABLETS AT SUPPER (DISCONTINUE METFORMIN XR)   Rybelsus 7 MG Tabs Generic drug: Semaglutide TAKE 1 TABLET DAILY          Review of Systems   LIPIDS: Treated with atorvastatin  LDL 145 at baseline It is last down below 100 and triglycerides are 156  Lab Results  Component Value Date   CHOL 148 09/23/2019   CHOL 166 06/15/2019   CHOL 133 12/31/2018   Lab Results  Component Value Date   HDL 33.60  (L) 09/23/2019   HDL 36.50 (L) 06/15/2019   HDL 32.70 (L) 12/31/2018   Lab Results  Component Value Date   LDLCALC 83  09/23/2019   LDLCALC 101 (H) 06/15/2019   LDLCALC 69 12/31/2018   Lab Results  Component Value Date   TRIG 156.0 (H) 09/23/2019   TRIG 140.0 06/15/2019   TRIG 161.0 (H) 12/31/2018   Lab Results  Component Value Date   CHOLHDL 4 09/23/2019   CHOLHDL 5 06/15/2019   CHOLHDL 4 12/31/2018   No results found for: LDLDIRECT  Last diabetic foot exam was in 07/2018   Treated with lisinopril 30 mg for history of hypertension  Blood pressure was higher on first measurement  BP Readings from Last 3 Encounters:  05/10/20 118/82  01/12/20 128/86  09/28/19 128/70      Examination:    BP 118/82   Pulse 66   Ht 5\' 7"  (1.702 m)   Wt (!) 319 lb 6.4 oz (144.9 kg)   SpO2 98%   BMI 50.03 kg/m   Assessment:  HYPOTHYROIDISM, diagnosed in 2014 also and with no specific symptoms at baseline  Her levothyroxine dose is recently averaging about 141 mcg daily She misunderstood the directions and instead of taking 6 tablets a week she is only taking 5 tablets of the 100 mcg prescription TSH is nearly 11 but not clear if she is symptomatic  DIABETES with morbid obesity:  She is on Metformin and Rybelsus 7 mg daily Tolerating both without side effects except occasional nausea if she eats late for breakfast.  The medication  This is From inconsistent diet and lack of exercise Has only occasional blood sugars above 180 after meals when she is checking them her fasting readings may be higher at times also  HYPERCHOLESTEROLEMIA: Controlled as of 9/21  Hypertension: Well-controlled with lisinopril   PLAN:   LEVOTHYROXINE 200 mcg 6 tablets a week   No change in Rybelsus or Metformin  However consider increasing Rybelsus if blood sugars are weight continues to go up  She will start a walking program, she thinks she can do that at lunchtime when she has decreased  responsibility next month  Follow-up in 3 months  There are no Patient Instructions on file for this visit.    10/21 05/10/2020, 8:46 AM    Note: This office note was prepared with Dragon voice recognition system technology. Any transcriptional errors that result from this process are unintentional.

## 2020-05-15 ENCOUNTER — Other Ambulatory Visit: Payer: Self-pay | Admitting: Orthopedic Surgery

## 2020-05-15 DIAGNOSIS — M25562 Pain in left knee: Secondary | ICD-10-CM | POA: Diagnosis not present

## 2020-05-28 ENCOUNTER — Ambulatory Visit
Admission: RE | Admit: 2020-05-28 | Discharge: 2020-05-28 | Disposition: A | Discharge status: Home / Self Care | Payer: BC Managed Care – PPO | Source: Ambulatory Visit | Attending: Orthopedic Surgery | Admitting: Orthopedic Surgery

## 2020-05-28 ENCOUNTER — Other Ambulatory Visit: Payer: Self-pay

## 2020-05-28 DIAGNOSIS — M25562 Pain in left knee: Secondary | ICD-10-CM

## 2020-06-11 DIAGNOSIS — M25562 Pain in left knee: Secondary | ICD-10-CM | POA: Diagnosis not present

## 2020-07-10 ENCOUNTER — Other Ambulatory Visit: Payer: Self-pay | Admitting: Endocrinology

## 2020-07-17 DIAGNOSIS — Z0000 Encounter for general adult medical examination without abnormal findings: Secondary | ICD-10-CM | POA: Diagnosis not present

## 2020-08-02 ENCOUNTER — Other Ambulatory Visit: Payer: Self-pay

## 2020-08-02 ENCOUNTER — Other Ambulatory Visit (INDEPENDENT_AMBULATORY_CARE_PROVIDER_SITE_OTHER): Payer: BC Managed Care – PPO

## 2020-08-02 DIAGNOSIS — E063 Autoimmune thyroiditis: Secondary | ICD-10-CM | POA: Diagnosis not present

## 2020-08-02 DIAGNOSIS — E782 Mixed hyperlipidemia: Secondary | ICD-10-CM

## 2020-08-02 DIAGNOSIS — E1165 Type 2 diabetes mellitus with hyperglycemia: Secondary | ICD-10-CM

## 2020-08-02 LAB — LIPID PANEL
Cholesterol: 137 mg/dL (ref 0–200)
HDL: 36.5 mg/dL — ABNORMAL LOW (ref >39.00)
LDL Cholesterol: 70 mg/dL (ref 0–99)
NonHDL: 100.09
Total CHOL/HDL Ratio: 4
Triglycerides: 149 mg/dL (ref 0.0–149.0)
VLDL: 29.8 mg/dL (ref 0.0–40.0)

## 2020-08-02 LAB — COMPREHENSIVE METABOLIC PANEL
ALT: 14 U/L (ref 0–35)
AST: 14 U/L (ref 0–37)
Albumin: 3.7 g/dL (ref 3.5–5.2)
Alkaline Phosphatase: 49 U/L (ref 39–117)
BUN: 17 mg/dL (ref 6–23)
CO2: 26 mEq/L (ref 19–32)
Calcium: 8.5 mg/dL (ref 8.4–10.5)
Chloride: 105 mEq/L (ref 96–112)
Creatinine, Ser: 0.79 mg/dL (ref 0.40–1.20)
GFR: 90.29 mL/min (ref >60.00)
Glucose, Bld: 110 mg/dL — ABNORMAL HIGH (ref 70–99)
Potassium: 4.6 mEq/L (ref 3.5–5.1)
Sodium: 138 mEq/L (ref 135–145)
Total Bilirubin: 0.3 mg/dL (ref 0.2–1.2)
Total Protein: 6.2 g/dL (ref 6.0–8.3)

## 2020-08-02 LAB — TSH: TSH: 4.9 u[IU]/mL (ref 0.35–5.50)

## 2020-08-02 LAB — HEMOGLOBIN A1C: Hgb A1c MFr Bld: 6.6 % — ABNORMAL HIGH (ref 4.6–6.5)

## 2020-08-02 LAB — T4, FREE: Free T4: 1.01 ng/dL (ref 0.60–1.60)

## 2020-08-09 ENCOUNTER — Other Ambulatory Visit: Payer: BC Managed Care – PPO

## 2020-08-13 ENCOUNTER — Ambulatory Visit: Payer: BC Managed Care – PPO | Admitting: Endocrinology

## 2020-08-13 ENCOUNTER — Encounter: Payer: Self-pay | Admitting: Endocrinology

## 2020-08-13 ENCOUNTER — Other Ambulatory Visit: Payer: Self-pay

## 2020-08-13 VITALS — BP 140/76 | HR 73 | Ht 66.0 in | Wt 336.0 lb

## 2020-08-13 DIAGNOSIS — E782 Mixed hyperlipidemia: Secondary | ICD-10-CM

## 2020-08-13 DIAGNOSIS — E1165 Type 2 diabetes mellitus with hyperglycemia: Secondary | ICD-10-CM

## 2020-08-13 DIAGNOSIS — E063 Autoimmune thyroiditis: Secondary | ICD-10-CM | POA: Diagnosis not present

## 2020-08-13 DIAGNOSIS — I10 Essential (primary) hypertension: Secondary | ICD-10-CM

## 2020-08-13 MED ORDER — RYBELSUS 14 MG PO TABS: 14.0000 mg | ORAL_TABLET | Freq: Every day | ORAL | 1 refills | Status: DC

## 2020-08-13 NOTE — Progress Notes (Signed)
Patient ID: Joan Walker, female   DOB: 1975/11/12, 45 y.o.   MRN: [id number]           Referring Physician: Lupe Carney  Reason for Appointment: Endocrinology follow-up    History of Present Illness:   Problem 1: Hypothyroidism was first diagnosed in ?  2015  At the time of diagnosis patient was getting a routine lab test for her annual exam but not having any symptoms of  fatigue, cold sensitivity, difficulty concentrating, dry skin, weight gain or hair loss.  Details of her prior treatments are not available except results pending 2017 when her thyroid supplement was that 150 mcg daily Subsequently her doses had been progressively increased and apparently in 2018 she was taking 200 mcg daily but no labs from 2018 are available Other labs indicate persistently high TSH levels since 2017  RECENT history: TSH on her initial consultation 5/19 was 7.7 when she was taking 250 mcg daily At that time she was complaining of feeling tired periodically Initially dose was increased to 300 mcg levothyroxine which improved her energy level  However subsequently has needed gradual reduction of her dosage She had been taking LEVOTHYROXINE 200 mcg, 6 days a week missing the dose on Sundays as directed  Previously was taking 6 days a week when her TSH was 10.9 Since then she thinks she has been feeling less of a slump  She is taking the levothyroxine supplement before breakfast consistently Weight has gone up again  Her TSH is upper normal now          Patient's weight history is as follows:  Wt Readings from Last 3 Encounters:  08/13/20 (!) 336 lb (152.4 kg)  05/10/20 (!) 319 lb 6.4 oz (144.9 kg)  01/12/20 (!) 310 lb 9.6 oz (140.9 kg)    Thyroid function results have been as follows:  Lab Results  Component Value Date   TSH 4.90 08/02/2020   TSH 10.88 (H) 05/07/2020   TSH <0.01 Repeated and verified X2. (L) 01/10/2020   TSH 2.90 06/15/2019   FREET4 1.01 08/02/2020   FREET4  0.77 05/07/2020   FREET4 1.52 01/10/2020   FREET4 1.20 06/15/2019   T3FREE 4.0 07/22/2017   T3FREE 4.1 06/02/2017   Date   free T4   TSH   T3   04/02/2015   11.38   06/12/2015  6.02    08/10/2015    9.56    10/22/2015     4.99   04/23/2017   5.76    DIABETES: Type 2 diabetes  Diagnosis date:  07/2018  History:     She had gone to her PCP for annual exam in 7/20 and was found to have a glucose of 257 and baseline A1c of 10.4 She was sent to nutritionist, started on monitoring with a One Touch meter and also prescribed metformin 1 g twice daily  Non-insulin hypoglycemic drugs: Metformin 0.5 g a.m., 1 g at dinner, on Rybelsus 7 mg in a.m.           Side effects from medications:  Mild diarrhea from Metformin  Current self management, blood sugar patterns and problems identified:  Her A1c is recently 6.6 compared to 6.7  Fructosamine last 227   She has a stable A1c and is consistent with her medications as above  Her weight has gone up significantly this year Not doing any exercise because of working 2 jobs and not having the time She says she knows how to plan her  meals but she does not follow her diet Likes to eat carbohydrates and sweets and she deals with stress with eating chocolate Blood sugars have been monitored with the [employer] meter and he did not bring this for download Despite her weight gain her blood sugars appear to be fairly good at home and 110 fasting in the lab  No blood sugars done before breakfast      Monitors blood glucose: Less than Once a day.    Glucometer: One Touch.           Blood Glucose readings by recall <140 including after meals Fasting: 118-125  PREVIOUS readings: Not above 1  PRE-MEAL Fasting Lunch Dinner Bedtime Overall  Glucose range:  129-153   129   111-225  Mean/median:  150     148   POST-MEAL PC Breakfast PC Lunch PC Dinner  Glucose range:    124-177  Mean/median:    152             Dietician visit: Most recent:   10/07/2018    Weight history:  Wt Readings from Last 3 Encounters:  08/13/20 (!) 336 lb (152.4 kg)  05/10/20 (!) 319 lb 6.4 oz (144.9 kg)  01/12/20 (!) 310 lb 9.6 oz (140.9 kg)   Lab Results  Component Value Date   HGBA1C 6.6 (H) 08/02/2020   HGBA1C 6.7 (H) 05/07/2020   HGBA1C 6.5 01/10/2020   Lab Results  Component Value Date   MICROALBUR <0.7 09/23/2019   LDLCALC 70 08/02/2020   CREATININE 0.79 08/02/2020     No past medical history on file.  Past Surgical History:  Procedure Laterality Date   CHOLECYSTECTOMY      No family history on file.  Social History:  reports that she quit smoking about 16 years ago. Her smoking use included cigarettes. She has never used smokeless tobacco. She reports that she does not drink alcohol and does not use drugs.  Allergies:  Allergies  Allergen Reactions   Other Swelling    Allergies as of 08/13/2020       Reactions   Other Swelling        Medication List        Accurate as of August 13, 2020  1:06 PM. If you have any questions, ask your nurse or doctor.          atorvastatin 10 MG tablet Commonly known as: LIPITOR TAKE 1 TABLET DAILY   ibuprofen 200 MG tablet Commonly known as: ADVIL Take 200 mg by mouth every 6 (six) hours as needed.   levothyroxine 200 MCG tablet Commonly known as: Synthroid 1 tablet before breakfast daily except half tablet on Sundays   lisinopril 30 MG tablet Commonly known as: ZESTRIL Take 1 tablet (30 mg total) by mouth daily.   meloxicam 15 MG tablet Commonly known as: MOBIC Take 15 mg by mouth daily.   metFORMIN 500 MG tablet Commonly known as: GLUCOPHAGE TAKE 1 TABLET DAILY WITH BREAKFAST, AND 2 TABLETS AT SUPPER (DISCONTINUE METFORMIN XR)   Rybelsus 7 MG Tabs Generic drug: Semaglutide TAKE 1 TABLET DAILY           Review of Systems   LIPIDS: Treated with atorvastatin  LDL 145 at baseline  LDL is well controlled and triglycerides are upper normal  Lab Results   Component Value Date   CHOL 137 08/02/2020   CHOL 148 09/23/2019   CHOL 166 06/15/2019   Lab Results  Component Value Date   HDL 36.50 (  L) 08/02/2020   HDL 33.60 (L) 09/23/2019   HDL 36.50 (L) 06/15/2019   Lab Results  Component Value Date   LDLCALC 70 08/02/2020   LDLCALC 83 09/23/2019   LDLCALC 101 (H) 06/15/2019   Lab Results  Component Value Date   TRIG 149.0 08/02/2020   TRIG 156.0 (H) 09/23/2019   TRIG 140.0 06/15/2019   Lab Results  Component Value Date   CHOLHDL 4 08/02/2020   CHOLHDL 4 09/23/2019   CHOLHDL 5 06/15/2019   No results found for: LDLDIRECT  Last diabetic foot exam was in 07/2018  Treated with lisinopril 30 mg for history of hypertension On Mobic now  Blood pressure is higher today, second measurement was 130/85 However blood pressure with PCP about 4 weeks ago was 120/80  BP Readings from Last 3 Encounters:  08/13/20 140/76  05/10/20 118/82  01/12/20 128/86   Swelling of legs: She has had chronic swelling of the legs but the skin is hard on the lower part   Examination:    BP 140/76   Pulse 73   Ht 5\' 6"  (1.676 m)   Wt (!) 336 lb (152.4 kg)   SpO2 96%   BMI 54.23 kg/m   No pitting edema present on her legs, appears to have more lymphedema  Assessment:  HYPOTHYROIDISM, diagnosed in 2014 also and with no specific symptoms at baseline  Her levothyroxine dose is recently 200 mcg, 6 days a week She was subjectively feeling better after going up 1 pill/week on the last visit   Requiring higher doses now because of progressive weight gain  Since she has a TSH of 4.9 she will go up half tablet weekly  DIABETES with morbid obesity:  She is on Metformin and Rybelsus 7 mg daily and although her blood sugars are well controlled she is gaining weight progressively She is not motivated to watch her diet She says she has no time for exercise She refuses to see the dietitian and she thinks she knows what to do but will not cut back on  carbohydrates and sweets  HYPERCHOLESTEROLEMIA: Controlled, does have low HDL from metabolic syndrome  Hypertension: Managed by PCP: Blood pressure may be higher recently from taking Mobic   PLAN:   LEVOTHYROXINE 200 mcg 6-1/2 tablets a week  Rybelsus 14 mg daily Encouraged her to work harder on getting her weight down at least with cutting back on total calorie intake and portions She will try to do as much walking as possible To bring monitor on the next visit and also continue checking readings after meals Will defer any management of her possible lymphedema to PCP Recommend that she take only half a tablet of Mobic as needed and not 15 mg to reduce potential effects on renal function and blood pressure Follow-up in 3 months  There are no Patient Instructions on file for this visit.    2015 08/13/2020, 1:06 PM    Note: This office note was prepared with Dragon voice recognition system technology. Any transcriptional errors that result from this process are unintentional.

## 2020-08-13 NOTE — Patient Instructions (Addendum)
Take 1/2 extra on Sundays  Take 14mg  of Rybelsus and let know   Lets cut back portions!  Check blood sugars on waking up days a week  Also check blood sugars about 2 hours after meals and do this after different meals by rotation  Recommended blood sugar levels on waking up are 90-130 and about 2 hours after meal is 130-160  Please bring your blood sugar monitor to each visit, thank you

## 2020-11-12 ENCOUNTER — Other Ambulatory Visit: Payer: Self-pay

## 2020-11-12 ENCOUNTER — Other Ambulatory Visit (INDEPENDENT_AMBULATORY_CARE_PROVIDER_SITE_OTHER): Payer: BC Managed Care – PPO

## 2020-11-12 DIAGNOSIS — E1165 Type 2 diabetes mellitus with hyperglycemia: Secondary | ICD-10-CM | POA: Diagnosis not present

## 2020-11-12 DIAGNOSIS — E063 Autoimmune thyroiditis: Secondary | ICD-10-CM

## 2020-11-13 LAB — BASIC METABOLIC PANEL
BUN: 13 mg/dL (ref 6–23)
CO2: 25 mEq/L (ref 19–32)
Calcium: 9 mg/dL (ref 8.4–10.5)
Chloride: 102 mEq/L (ref 96–112)
Creatinine, Ser: 0.76 mg/dL (ref 0.40–1.20)
GFR: 94.4 mL/min (ref >60.00)
Glucose, Bld: 98 mg/dL (ref 70–99)
Potassium: 3.8 mEq/L (ref 3.5–5.1)
Sodium: 137 mEq/L (ref 135–145)

## 2020-11-13 LAB — HEMOGLOBIN A1C: Hgb A1c MFr Bld: 6.4 % (ref 4.6–6.5)

## 2020-11-13 LAB — TSH: TSH: 4.5 u[IU]/mL (ref 0.35–5.50)

## 2020-11-13 LAB — MICROALBUMIN / CREATININE URINE RATIO
Creatinine,U: 224 mg/dL
Microalb Creat Ratio: 11.7 mg/g (ref 0.0–30.0)
Microalb, Ur: 26.1 mg/dL — ABNORMAL HIGH (ref 0.0–1.9)

## 2020-11-16 ENCOUNTER — Other Ambulatory Visit: Payer: Self-pay

## 2020-11-16 ENCOUNTER — Encounter: Payer: Self-pay | Admitting: Endocrinology

## 2020-11-16 ENCOUNTER — Ambulatory Visit (INDEPENDENT_AMBULATORY_CARE_PROVIDER_SITE_OTHER): Payer: BC Managed Care – PPO | Admitting: Endocrinology

## 2020-11-16 DIAGNOSIS — I10 Essential (primary) hypertension: Secondary | ICD-10-CM | POA: Diagnosis not present

## 2020-11-16 DIAGNOSIS — E039 Hypothyroidism, unspecified: Secondary | ICD-10-CM | POA: Diagnosis not present

## 2020-11-16 DIAGNOSIS — E1169 Type 2 diabetes mellitus with other specified complication: Secondary | ICD-10-CM | POA: Diagnosis not present

## 2020-11-16 DIAGNOSIS — E782 Mixed hyperlipidemia: Secondary | ICD-10-CM

## 2020-11-16 NOTE — Patient Instructions (Signed)
Walk daily 

## 2020-11-16 NOTE — Progress Notes (Signed)
Patient ID: Joan Walker, female   DOB: March 08, 1975, 45 y.o.   MRN: [id number]           Referring Physician: Lupe Carney  Reason for Appointment: Endocrinology follow-up    History of Present Illness:   Problem 1: Hypothyroidism was first diagnosed in ?  2015  At the time of diagnosis patient was getting a routine lab test for her annual exam but not having any symptoms of  fatigue, cold sensitivity, difficulty concentrating, dry skin, weight gain or hair loss.  Details of her prior treatments are not available except results pending 2017 when her thyroid supplement was that 150 mcg daily Subsequently her doses had been progressively increased and apparently in 2018 she was taking 200 mcg daily but no labs from 2018 are available Other labs indicate persistently high TSH levels since 2017  RECENT history:  TSH on her initial consultation 5/19 was 7.7 when she was taking 250 mcg daily At that time she was complaining of feeling tired periodically Initially dose was increased to 300 mcg levothyroxine which improved her energy level  However subsequently has needed relatively lower doses She had been taking LEVOTHYROXINE 200 mcg, 6 days a week and 1/2 tablet on Sundays  She is not sure how her energy level is as she gets tired because of long working hours  She is taking the levothyroxine supplement before breakfast consistently  Her TSH is upper normal as before          Patient's weight history is as follows:  Wt Readings from Last 3 Encounters:  11/16/20 (!) 331 lb 6.4 oz (150.3 kg)  08/13/20 (!) 336 lb (152.4 kg)  05/10/20 (!) 319 lb 6.4 oz (144.9 kg)    Thyroid function results have been as follows:  Lab Results  Component Value Date   TSH 4.50 11/12/2020   TSH 4.90 08/02/2020   TSH 10.88 (H) 05/07/2020   TSH <0.01 Repeated and verified X2. (L) 01/10/2020   FREET4 1.01 08/02/2020   FREET4 0.77 05/07/2020   FREET4 1.52 01/10/2020   FREET4 1.20 06/15/2019    T3FREE 4.0 07/22/2017   T3FREE 4.1 06/02/2017   Date   free T4   TSH   T3   04/02/2015   11.38   06/12/2015  6.02    08/10/2015    9.56    10/22/2015     4.99   04/23/2017   5.76    DIABETES: Type 2 diabetes  Diagnosis date:  07/2018  History:     She had gone to her PCP for annual exam in 7/20 and was found to have a glucose of 257 and baseline A1c of 10.4 She was sent to nutritionist, started on monitoring with a One Touch meter and also prescribed metformin 1 g twice daily  Non-insulin hypoglycemic drugs: Metformin 0.5 g a.m., 1 g at dinner,  Rybelsus 14 mg in a.m.           Side effects from medications:  Mild diarrhea from Metformin  Current self management, blood sugar patterns and problems identified:  Her A1c is recently 6.4 and improved   She has tolerated the 14 mg of Rybelsus that was started in July 2022 No nausea with this; also taking it before eating in the morning as before She says she has not had any time to check her sugar except once a couple of weeks ago when it was 127 Lab glucose was 98 late afternoon Although previously her weight had  gone up significantly it is now down 5 pounds She is frequently eating fast food and not always making low-fat choices She does not think she has any time to exercise  No blood sugars done before breakfast      Monitors blood glucose: Less than Once a day.    Glucometer: One Touch.           Blood Glucose readings not available  Previously fasting: 118-125            Dietician visit: Most recent:   10/07/2018   Weight history:  Wt Readings from Last 3 Encounters:  11/16/20 (!) 331 lb 6.4 oz (150.3 kg)  08/13/20 (!) 336 lb (152.4 kg)  05/10/20 (!) 319 lb 6.4 oz (144.9 kg)   Lab Results  Component Value Date   HGBA1C 6.4 11/12/2020   HGBA1C 6.6 (H) 08/02/2020   HGBA1C 6.7 (H) 05/07/2020   Lab Results  Component Value Date   MICROALBUR 26.1 (H) 11/12/2020   LDLCALC 70 08/02/2020   CREATININE 0.76 11/12/2020      No past medical history on file.  Past Surgical History:  Procedure Laterality Date  . CHOLECYSTECTOMY      No family history on file.  Social History:  reports that she quit smoking about 16 years ago. Her smoking use included cigarettes. She has never used smokeless tobacco. She reports that she does not drink alcohol and does not use drugs.  Allergies:  Allergies  Allergen Reactions  . Other Swelling    Allergies as of 11/16/2020       Reactions   Other Swelling        Medication List        Accurate as of November 16, 2020  9:22 AM. If you have any questions, ask your nurse or doctor.          atorvastatin 10 MG tablet Commonly known as: LIPITOR TAKE 1 TABLET DAILY   levothyroxine 200 MCG tablet Commonly known as: Synthroid 1 tablet before breakfast daily except half tablet on Sundays   lisinopril 30 MG tablet Commonly known as: ZESTRIL Take 1 tablet (30 mg total) by mouth daily.   meloxicam 15 MG tablet Commonly known as: MOBIC Take 15 mg by mouth daily.   metFORMIN 500 MG tablet Commonly known as: GLUCOPHAGE TAKE 1 TABLET DAILY WITH BREAKFAST, AND 2 TABLETS AT SUPPER (DISCONTINUE METFORMIN XR)   Rybelsus 14 MG Tabs Generic drug: Semaglutide Take 14 mg by mouth daily. At least 30 minutes before breakfast           Review of Systems   LIPIDS: Treated with atorvastatin  LDL 145 at baseline  LDL is well controlled and triglycerides are upper normal  Lab Results  Component Value Date   CHOL 137 08/02/2020   CHOL 148 09/23/2019   CHOL 166 06/15/2019   Lab Results  Component Value Date   HDL 36.50 (L) 08/02/2020   HDL 33.60 (L) 09/23/2019   HDL 36.50 (L) 06/15/2019   Lab Results  Component Value Date   LDLCALC 70 08/02/2020   LDLCALC 83 09/23/2019   LDLCALC 101 (H) 06/15/2019   Lab Results  Component Value Date   TRIG 149.0 08/02/2020   TRIG 156.0 (H) 09/23/2019   TRIG 140.0 06/15/2019   Lab Results  Component  Value Date   CHOLHDL 4 08/02/2020   CHOLHDL 4 09/23/2019   CHOLHDL 5 06/15/2019   No results found for: LDLDIRECT   Treated with lisinopril 30  mg for history of hypertension  Blood pressure reportedly goes up in the doctor's office   BP Readings from Last 3 Encounters:  11/16/20 128/83  08/13/20 140/76  05/10/20 118/82      Examination:    BP 128/83 (Cuff Size: Large)   Pulse 78   Ht 5\' 7"  (1.702 m)   Wt (!) 331 lb 6.4 oz (150.3 kg)   SpO2 99%   BMI 51.90 kg/m     Assessment:  HYPOTHYROIDISM, diagnosed in 2014 also and with no typical symptoms at baseline  Her levothyroxine dose is recently 200 mcg, 6-1/2 tablets a week  Unable to assess her symptoms since she has some fatigue from long working hours  Requiring higher doses now because of progressive weight gain  Since she has a TSH of 4.5 and she is subjectively doing well she will continue the same regimen   DIABETES with morbid obesity:  She is on Metformin and Rybelsus 14 mg daily With higher doses of Rybelsus she has been able to cut back on portions although sometimes will skip meals or eat fast food  Blood glucose monitoring not done likely She says she has no time for exercise She has refused to see the dietitian   Hypertension: Managed by PCP: Blood pressure high normal possibly from stress and whitecoat syndrome  Currently no microalbuminuria and renal function normal  PLAN:   Continue LEVOTHYROXINE 200 mcg 6-1/2 tablets a week  She will stay on Rybelsus 14 mg daily Again discussed with her the need to modify her diet with lower fat options when eating out  She needs to start planning a time for exercise also  No change in metformin Blood sugars to be checked more consistently at least once or twice a week including after meals Will defer any management of her possible lymphedema to PCP Follow-up with PCP regarding blood pressure, may consider increasing lisinopril Avoid taking  nonsteroidal anti-inflammatory drugs as much as possible Follow-up in 4 months  Patient Instructions  Walk daily     2015 11/16/2020, 9:22 AM    Note: This office note was prepared with Dragon voice recognition system technology. Any transcriptional errors that result from this process are unintentional.

## 2020-11-19 ENCOUNTER — Other Ambulatory Visit: Payer: Self-pay | Admitting: Endocrinology

## 2020-12-07 DIAGNOSIS — B029 Zoster without complications: Secondary | ICD-10-CM | POA: Diagnosis not present

## 2020-12-07 DIAGNOSIS — B372 Candidiasis of skin and nail: Secondary | ICD-10-CM | POA: Diagnosis not present

## 2020-12-12 DIAGNOSIS — E119 Type 2 diabetes mellitus without complications: Secondary | ICD-10-CM | POA: Diagnosis not present

## 2021-01-10 ENCOUNTER — Other Ambulatory Visit: Payer: Self-pay | Admitting: Endocrinology

## 2021-02-04 ENCOUNTER — Other Ambulatory Visit: Payer: Self-pay

## 2021-02-04 DIAGNOSIS — E782 Mixed hyperlipidemia: Secondary | ICD-10-CM

## 2021-02-04 DIAGNOSIS — E1169 Type 2 diabetes mellitus with other specified complication: Secondary | ICD-10-CM

## 2021-02-04 MED ORDER — METFORMIN HCL 500 MG PO TABS: ORAL_TABLET | ORAL | 3 refills | Status: DC

## 2021-02-04 MED ORDER — RYBELSUS 14 MG PO TABS: 14.0000 mg | ORAL_TABLET | Freq: Every day | ORAL | 3 refills | Status: DC

## 2021-02-04 MED ORDER — ATORVASTATIN CALCIUM 10 MG PO TABS: 10.0000 mg | ORAL_TABLET | Freq: Every day | ORAL | 3 refills | Status: DC

## 2021-02-04 NOTE — Addendum Note (Signed)
Addended by: Eliseo Squires on: 02/04/2021 11:50 AM   Modules accepted: Orders

## 2021-02-16 DIAGNOSIS — L720 Epidermal cyst: Secondary | ICD-10-CM | POA: Diagnosis not present

## 2021-02-16 DIAGNOSIS — J0100 Acute maxillary sinusitis, unspecified: Secondary | ICD-10-CM | POA: Diagnosis not present

## 2021-03-07 DIAGNOSIS — L729 Follicular cyst of the skin and subcutaneous tissue, unspecified: Secondary | ICD-10-CM | POA: Diagnosis not present

## 2021-03-07 DIAGNOSIS — L929 Granulomatous disorder of the skin and subcutaneous tissue, unspecified: Secondary | ICD-10-CM | POA: Diagnosis not present

## 2021-04-20 DIAGNOSIS — J0100 Acute maxillary sinusitis, unspecified: Secondary | ICD-10-CM | POA: Diagnosis not present

## 2021-04-20 DIAGNOSIS — R030 Elevated blood-pressure reading, without diagnosis of hypertension: Secondary | ICD-10-CM | POA: Diagnosis not present

## 2021-05-10 ENCOUNTER — Telehealth: Payer: Self-pay | Admitting: Endocrinology

## 2021-05-10 NOTE — Telephone Encounter (Signed)
Patient is calling to get a refill on her thyroid medication, but is not sure if she needs an office visit or not. ?levothyroxine 200 MCG tablet ?Commonly known as: Synthroid  and patient uses ?[id number] - [place], [place] - [address] (Ph: [phone]) ?

## 2021-05-13 MED ORDER — LEVOTHYROXINE SODIUM 200 MCG PO TABS: ORAL_TABLET | ORAL | 1 refills | Status: DC

## 2021-05-13 NOTE — Telephone Encounter (Signed)
Refill sent, patient is due for follow up. ?

## 2021-06-06 ENCOUNTER — Other Ambulatory Visit: Payer: Self-pay | Admitting: Endocrinology

## 2021-07-09 ENCOUNTER — Other Ambulatory Visit: Payer: Self-pay | Admitting: Family Medicine

## 2021-07-09 DIAGNOSIS — E1165 Type 2 diabetes mellitus with hyperglycemia: Secondary | ICD-10-CM | POA: Diagnosis not present

## 2021-07-09 DIAGNOSIS — Z0000 Encounter for general adult medical examination without abnormal findings: Secondary | ICD-10-CM | POA: Diagnosis not present

## 2021-07-09 DIAGNOSIS — I10 Essential (primary) hypertension: Secondary | ICD-10-CM | POA: Diagnosis not present

## 2021-07-09 DIAGNOSIS — Z23 Encounter for immunization: Secondary | ICD-10-CM | POA: Diagnosis not present

## 2021-07-09 DIAGNOSIS — E7800 Pure hypercholesterolemia, unspecified: Secondary | ICD-10-CM | POA: Diagnosis not present

## 2021-07-09 DIAGNOSIS — E039 Hypothyroidism, unspecified: Secondary | ICD-10-CM | POA: Diagnosis not present

## 2021-07-09 DIAGNOSIS — Z1231 Encounter for screening mammogram for malignant neoplasm of breast: Secondary | ICD-10-CM

## 2021-07-09 DIAGNOSIS — Z1159 Encounter for screening for other viral diseases: Secondary | ICD-10-CM | POA: Diagnosis not present

## 2021-07-10 ENCOUNTER — Other Ambulatory Visit (INDEPENDENT_AMBULATORY_CARE_PROVIDER_SITE_OTHER): Payer: BC Managed Care – PPO

## 2021-07-10 DIAGNOSIS — E039 Hypothyroidism, unspecified: Secondary | ICD-10-CM | POA: Diagnosis not present

## 2021-07-10 DIAGNOSIS — E1169 Type 2 diabetes mellitus with other specified complication: Secondary | ICD-10-CM | POA: Diagnosis not present

## 2021-07-10 LAB — BASIC METABOLIC PANEL
BUN: 12 mg/dL (ref 6–23)
CO2: 28 mEq/L (ref 19–32)
Calcium: 9.2 mg/dL (ref 8.4–10.5)
Chloride: 101 mEq/L (ref 96–112)
Creatinine, Ser: 0.75 mg/dL (ref 0.40–1.20)
GFR: 95.47 mL/min (ref >60.00)
Glucose, Bld: 156 mg/dL — ABNORMAL HIGH (ref 70–99)
Potassium: 4.7 mEq/L (ref 3.5–5.1)
Sodium: 138 mEq/L (ref 135–145)

## 2021-07-10 LAB — HEMOGLOBIN A1C: Hgb A1c MFr Bld: 7.4 % — ABNORMAL HIGH (ref 4.6–6.5)

## 2021-07-10 LAB — T4, FREE: Free T4: 1.22 ng/dL (ref 0.60–1.60)

## 2021-07-10 LAB — TSH: TSH: 4.31 u[IU]/mL (ref 0.35–5.50)

## 2021-07-11 ENCOUNTER — Ambulatory Visit
Admission: RE | Admit: 2021-07-11 | Discharge: 2021-07-11 | Disposition: A | Discharge status: Home / Self Care | Payer: BC Managed Care – PPO | Source: Ambulatory Visit | Attending: Family Medicine | Admitting: Family Medicine

## 2021-07-11 ENCOUNTER — Ambulatory Visit (INDEPENDENT_AMBULATORY_CARE_PROVIDER_SITE_OTHER): Payer: BC Managed Care – PPO | Admitting: Endocrinology

## 2021-07-11 VITALS — BP 126/88 | HR 85 | Ht 67.0 in | Wt 328.0 lb

## 2021-07-11 DIAGNOSIS — E039 Hypothyroidism, unspecified: Secondary | ICD-10-CM

## 2021-07-11 DIAGNOSIS — E782 Mixed hyperlipidemia: Secondary | ICD-10-CM | POA: Diagnosis not present

## 2021-07-11 DIAGNOSIS — E1165 Type 2 diabetes mellitus with hyperglycemia: Secondary | ICD-10-CM | POA: Diagnosis not present

## 2021-07-11 DIAGNOSIS — Z1231 Encounter for screening mammogram for malignant neoplasm of breast: Secondary | ICD-10-CM | POA: Diagnosis not present

## 2021-07-11 MED ORDER — LEVOTHYROXINE SODIUM 200 MCG PO TABS: ORAL_TABLET | ORAL | 1 refills | Status: DC

## 2021-07-11 MED ORDER — METFORMIN HCL ER 500 MG PO TB24: 2000.0000 mg | ORAL_TABLET | Freq: Every day | ORAL | 3 refills | Status: DC

## 2021-07-11 NOTE — Progress Notes (Signed)
Patient ID: Joan Walker, female   DOB: 1975/08/30, 46 y.o.   MRN: [id number]           Referring Physician: Lupe Carney  Reason for Appointment: Endocrinology follow-up    History of Present Illness:   Problem 1: Hypothyroidism was first diagnosed in ?  2015  At the time of diagnosis patient was getting a routine lab test for her annual exam but not having any symptoms of  fatigue, cold sensitivity, difficulty concentrating, dry skin, weight gain or hair loss.  Details of her prior treatments are not available except results pending 2017 when her thyroid supplement was that 150 mcg daily Subsequently her doses had been progressively increased and apparently in 2018 she was taking 200 mcg daily but no labs from 2018 are available Other labs indicate persistently high TSH levels since 2017  RECENT history:  TSH on her initial consultation 5/19 was 7.7 when she was taking 250 mcg daily At that time she was complaining of feeling tired periodically Initially dose was increased to 300 mcg levothyroxine which improved her energy level  However subsequently has needed relatively lower doses She had been taking LEVOTHYROXINE 200 mcg, 6 days a week only Previously was taking half tablet on Sundays but she forgot this and is skipping Sundays  No unusual complaints of fatigue  She is taking the levothyroxine supplement before breakfast regularly as scheduled  Her TSH is upper normal as before          Patient's weight history is as follows:  Wt Readings from Last 3 Encounters:  07/11/21 (!) 328 lb (148.8 kg)  11/16/20 (!) 331 lb 6.4 oz (150.3 kg)  08/13/20 (!) 336 lb (152.4 kg)    Thyroid function results have been as follows:  Lab Results  Component Value Date   TSH 4.31 07/10/2021   TSH 4.50 11/12/2020   TSH 4.90 08/02/2020   TSH 10.88 (H) 05/07/2020   FREET4 1.22 07/10/2021   FREET4 1.01 08/02/2020   FREET4 0.77 05/07/2020   FREET4 1.52 01/10/2020   T3FREE 4.0  07/22/2017   T3FREE 4.1 06/02/2017   Date   free T4   TSH   T3   04/02/2015   11.38   06/12/2015  6.02    08/10/2015    9.56    10/22/2015     4.99   04/23/2017   5.76    DIABETES: Type 2 diabetes  Diagnosis date:  07/2018  History:     She had gone to her PCP for annual exam in 7/20 and was found to have a glucose of 257 and baseline A1c of 10.4 She was sent to nutritionist, started on monitoring with a One Touch meter and also prescribed metformin 1 g twice daily  Non-insulin hypoglycemic drugs: Metformin 0.5 g a.m., 1 g at dinner,  Rybelsus 14 mg in a.m.           Side effects from medications:  Mild diarrhea from Metformin  Current self management, blood sugar patterns and problems identified:  Her A1c is back up to 7.4 compared to 6.4 and previously stable below 7   She has not been seen since 10/22  She says that she is eating desserts every night and blood sugars are higher because of this  She does not like fingerprints and will not check her sugars  Has not done any exercise lately but she thinks she may be able to start now  She does take her Rybelsus as  prescribed daily and has no nausea  Likely not increasing her portions overall since her weight is still down 3 pounds Lab glucose was 156 fasting For the last 2 weeks she is having a couple of loose stools a day, not clear why she was switched from metformin ER to regular metformin       Glucometer: One Touch.           Blood Glucose readings not available  Previously fasting: 118-125            Dietician visit: Most recent:   10/07/2018   Weight history:  Wt Readings from Last 3 Encounters:  07/11/21 (!) 328 lb (148.8 kg)  11/16/20 (!) 331 lb 6.4 oz (150.3 kg)  08/13/20 (!) 336 lb (152.4 kg)   Lab Results  Component Value Date   HGBA1C 7.4 (H) 07/10/2021   HGBA1C 6.4 11/12/2020   HGBA1C 6.6 (H) 08/02/2020   Lab Results  Component Value Date   MICROALBUR 26.1 (H) 11/12/2020   LDLCALC 70 08/02/2020    CREATININE 0.75 07/10/2021     No past medical history on file.  Past Surgical History:  Procedure Laterality Date   CHOLECYSTECTOMY      Family History  Problem Relation Age of Onset   Bladder Cancer Mother        28s   Prostate cancer Maternal Uncle    Lung cancer Paternal Aunt    Lung cancer Paternal Grandmother     Social History:  reports that she quit smoking about 17 years ago. Her smoking use included cigarettes. She has never used smokeless tobacco. She reports that she does not drink alcohol and does not use drugs.  Allergies:  Allergies  Allergen Reactions   Other Swelling    Allergies as of 07/11/2021       Reactions   Other Swelling        Medication List        Accurate as of July 11, 2021  8:45 AM. If you have any questions, ask your nurse or doctor.          atorvastatin 10 MG tablet Commonly known as: LIPITOR Take 1 tablet (10 mg total) by mouth daily.   levothyroxine 200 MCG tablet Commonly known as: SYNTHROID 1 TABLET BEFORE BREAKFAST DAILY EXCEPT HALF TABLET ON SUNDAYS   lisinopril 30 MG tablet Commonly known as: ZESTRIL Take 1 tablet (30 mg total) by mouth daily. What changed: how much to take   meloxicam 15 MG tablet Commonly known as: MOBIC Take 15 mg by mouth daily.   metFORMIN 500 MG tablet Commonly known as: GLUCOPHAGE TAKE 1 TABLET DAILY WITH BREAKFAST, AND 2 TABLETS AT SUPPER (DISCONTINUE METFORMIN XR)   Rybelsus 14 MG Tabs Generic drug: Semaglutide Take 14 mg by mouth daily. At least 30 minutes before breakfast           Review of Systems   LIPIDS: Treated with atorvastatin  LDL 145 at baseline  LDL is well controlled and triglycerides are upper normal  Lab Results  Component Value Date   CHOL 137 08/02/2020   CHOL 148 09/23/2019   CHOL 166 06/15/2019   Lab Results  Component Value Date   HDL 36.50 (L) 08/02/2020   HDL 33.60 (L) 09/23/2019   HDL 36.50 (L) 06/15/2019   Lab Results  Component  Value Date   LDLCALC 70 08/02/2020   LDLCALC 83 09/23/2019   LDLCALC 101 (H) 06/15/2019   Lab Results  Component Value  Date   TRIG 149.0 08/02/2020   TRIG 156.0 (H) 09/23/2019   TRIG 140.0 06/15/2019   Lab Results  Component Value Date   CHOLHDL 4 08/02/2020   CHOLHDL 4 09/23/2019   CHOLHDL 5 06/15/2019   No results found for: "LDLDIRECT"   Treated with lisinopril 30 mg for history of hypertension This is followed by her PCP, last BP was 144/70   BP Readings from Last 3 Encounters:  07/11/21 126/88  11/16/20 128/83  08/13/20 140/76      Examination:    BP 126/88   Pulse 85   Ht 5\' 7"  (1.702 m)   Wt (!) 328 lb (148.8 kg)   LMP  (LMP Unknown)   SpO2 99%   BMI 51.37 kg/m   No ankle edema  Assessment:  HYPOTHYROIDISM, diagnosed in 2014 also and with no typical symptoms at baseline  Her levothyroxine dose is recently 200 mcg, 6 tablets a week  Not complaining of any fatigue lately  Since she has an upper normal TSH likely needs a little higher dose   DIABETES with morbid obesity:  She is on Metformin and Rybelsus 14 mg daily Blood sugars are higher from going off her diet even though she is able to maintain her weight Blood glucose monitoring not done as before She says she has no motivation for exercise so far but may be able to start She has refused to see the dietitian previously  Hypertension: Managed by PCP: Blood pressure high normal   LIPIDS: Will need follow-up labs on the next visit   PLAN:   LEVOTHYROXINE 200 mcg 6-1/2 tablets a week  She will stay on Rybelsus 14 mg daily, she refuses to consider an injectable for a GLP-1 drug or Mounjaro We will try metformin ER instead of regular metformin and see if she can titrate slowly up to 4 tablets daily She needs to cut back on portions of desserts or eliminate them  Start walking or other exercise regularly  Follow-up in 4 months  Patient Instructions  Take extra 1/2 thyroid pill  weekly     2015 07/11/2021, 8:45 AM    Note: This office note was prepared with Dragon voice recognition system technology. Any transcriptional errors that result from this process are unintentional.

## 2021-07-11 NOTE — Patient Instructions (Signed)
Take extra 1/2 thyroid pill weekly

## 2021-08-05 ENCOUNTER — Telehealth: Payer: Self-pay | Admitting: Endocrinology

## 2021-08-06 ENCOUNTER — Other Ambulatory Visit: Payer: Self-pay

## 2021-08-06 DIAGNOSIS — I10 Essential (primary) hypertension: Secondary | ICD-10-CM | POA: Diagnosis not present

## 2021-08-06 DIAGNOSIS — E1165 Type 2 diabetes mellitus with hyperglycemia: Secondary | ICD-10-CM | POA: Diagnosis not present

## 2021-08-06 MED ORDER — LEVOTHYROXINE SODIUM 200 MCG PO TABS: ORAL_TABLET | ORAL | 1 refills | Status: DC

## 2021-08-09 ENCOUNTER — Other Ambulatory Visit: Payer: Self-pay | Admitting: Endocrinology

## 2021-08-12 MED ORDER — RYBELSUS 14 MG PO TABS: 14.0000 mg | ORAL_TABLET | Freq: Every day | ORAL | 3 refills | Status: DC

## 2021-08-12 NOTE — Telephone Encounter (Signed)
MEDICATION: Rybelsus 14 MG  PHARMACY:  [facility] on [address]   HAS THE PATIENT CONTACTED THEIR PHARMACY?  yes  IS THIS A 90 DAY SUPPLY : YES  IS PATIENT OUT OF MEDICATION: YES  IF NOT; HOW MUCH IS LEFT:   LAST APPOINTMENT DATE: @6 /22/2023  NEXT APPOINTMENT DATE:@7 /18/2023  DO WE HAVE YOUR PERMISSION TO LEAVE A DETAILED MESSAGE?: yes  OTHER COMMENTS:    **Let patient know to contact pharmacy at the end of the day to make sure medication is ready. **  ** Please notify patient to allow 48-72 hours to process**  **Encourage patient to contact the pharmacy for refills or they can request refills through [facility]**

## 2021-08-12 NOTE — Telephone Encounter (Signed)
RX now sent to preferred pharmacy.  

## 2021-08-14 ENCOUNTER — Other Ambulatory Visit: Payer: Self-pay | Admitting: Endocrinology

## 2021-09-02 ENCOUNTER — Telehealth: Payer: Self-pay

## 2021-09-02 NOTE — Telephone Encounter (Signed)
Patient called in states that she cannot take the metformin ER she has tried to give her body time to get used to it but is still going to bathroom at least 7 times a day with diarrhea.

## 2021-09-03 NOTE — Telephone Encounter (Signed)
Per patient she has went back to her old metformin and taking 2 in morning and 2 in afternoon.

## 2021-10-14 ENCOUNTER — Other Ambulatory Visit (INDEPENDENT_AMBULATORY_CARE_PROVIDER_SITE_OTHER): Payer: BC Managed Care – PPO

## 2021-10-14 DIAGNOSIS — E782 Mixed hyperlipidemia: Secondary | ICD-10-CM | POA: Diagnosis not present

## 2021-10-14 DIAGNOSIS — E039 Hypothyroidism, unspecified: Secondary | ICD-10-CM | POA: Diagnosis not present

## 2021-10-14 DIAGNOSIS — E1165 Type 2 diabetes mellitus with hyperglycemia: Secondary | ICD-10-CM | POA: Diagnosis not present

## 2021-10-14 LAB — COMPREHENSIVE METABOLIC PANEL
ALT: 21 U/L (ref 0–35)
AST: 26 U/L (ref 0–37)
Albumin: 3.9 g/dL (ref 3.5–5.2)
Alkaline Phosphatase: 56 U/L (ref 39–117)
BUN: 11 mg/dL (ref 6–23)
CO2: 26 mEq/L (ref 19–32)
Calcium: 8.7 mg/dL (ref 8.4–10.5)
Chloride: 101 mEq/L (ref 96–112)
Creatinine, Ser: 0.69 mg/dL (ref 0.40–1.20)
GFR: 103.88 mL/min (ref >60.00)
Glucose, Bld: 155 mg/dL — ABNORMAL HIGH (ref 70–99)
Potassium: 4.5 mEq/L (ref 3.5–5.1)
Sodium: 138 mEq/L (ref 135–145)
Total Bilirubin: 0.3 mg/dL (ref 0.2–1.2)
Total Protein: 6.9 g/dL (ref 6.0–8.3)

## 2021-10-14 LAB — MICROALBUMIN / CREATININE URINE RATIO
Creatinine,U: 90.5 mg/dL
Microalb Creat Ratio: 1.7 mg/g (ref 0.0–30.0)
Microalb, Ur: 1.5 mg/dL (ref 0.0–1.9)

## 2021-10-14 LAB — LIPID PANEL
Cholesterol: 171 mg/dL (ref 0–200)
HDL: 40.2 mg/dL (ref >39.00)
LDL Cholesterol: 104 mg/dL — ABNORMAL HIGH (ref 0–99)
NonHDL: 130.65
Total CHOL/HDL Ratio: 4
Triglycerides: 134 mg/dL (ref 0.0–149.0)
VLDL: 26.8 mg/dL (ref 0.0–40.0)

## 2021-10-14 LAB — HEMOGLOBIN A1C: Hgb A1c MFr Bld: 7.4 % — ABNORMAL HIGH (ref 4.6–6.5)

## 2021-10-14 LAB — T4, FREE: Free T4: 0.86 ng/dL (ref 0.60–1.60)

## 2021-10-14 LAB — TSH: TSH: 9.49 u[IU]/mL — ABNORMAL HIGH (ref 0.35–5.50)

## 2021-10-15 ENCOUNTER — Telehealth: Payer: Self-pay

## 2021-10-15 NOTE — Telephone Encounter (Signed)
Voicemail was left with patient phone number. I could not understand what was needed. I tried calling number back but no answer. Left voicemail.

## 2021-10-17 ENCOUNTER — Ambulatory Visit (INDEPENDENT_AMBULATORY_CARE_PROVIDER_SITE_OTHER): Payer: BC Managed Care – PPO | Admitting: Endocrinology

## 2021-10-17 ENCOUNTER — Encounter: Payer: Self-pay | Admitting: Endocrinology

## 2021-10-17 VITALS — BP 118/84 | HR 83 | Ht 66.5 in | Wt 338.4 lb

## 2021-10-17 DIAGNOSIS — E1165 Type 2 diabetes mellitus with hyperglycemia: Secondary | ICD-10-CM | POA: Diagnosis not present

## 2021-10-17 DIAGNOSIS — E039 Hypothyroidism, unspecified: Secondary | ICD-10-CM | POA: Diagnosis not present

## 2021-10-17 DIAGNOSIS — E782 Mixed hyperlipidemia: Secondary | ICD-10-CM

## 2021-10-17 MED ORDER — TIRZEPATIDE 5 MG/0.5ML ~~LOC~~ SOAJ: 5.0000 mg | SUBCUTANEOUS | 0 refills | Status: DC

## 2021-10-17 NOTE — Patient Instructions (Addendum)
Mounjaro 2.5 weekly for 2 shots then 5 mg x 4 then 10mg  weekly  Walk daily  Thyroid daily  2 Lipitor daily

## 2021-10-17 NOTE — Progress Notes (Signed)
Patient ID: ERMAL LOM, female   DOB: December 12, 1975, 46 y.o.   MRN: [id number]           Referring Physician: Donnie Coffin  Reason for Appointment: Endocrinology follow-up    History of Present Illness:   Problem 1: Hypothyroidism was first diagnosed in ?  2015  At the time of diagnosis patient was getting a routine lab test for her annual exam but not having any symptoms of  fatigue, cold sensitivity, difficulty concentrating, dry skin, weight gain or hair loss.  Details of her prior treatments are not available except results pending 2017 when her thyroid supplement was that 150 mcg daily Subsequently her doses had been progressively increased and apparently in 2018 she was taking 200 mcg daily but no labs from 2018 are available Other labs indicate persistently high TSH levels since 2017  RECENT history:  TSH on her initial consultation 5/19 was 7.7 when she was taking 250 mcg daily At that time she was complaining of feeling tired periodically Initially dose was increased to 300 mcg levothyroxine which improved her energy level  However subsequently has needed relatively lower doses She had been taking LEVOTHYROXINE 200 mcg, 6 days a week only Supposed to be taking a half tablet on Sundays which she keeps forgetting and does not like to break the pills in half Currently not complaining of any unusual fatigue except this week for other reasons Has gained weight  She is taking the levothyroxine supplement before breakfast regularly as before  Her TSH is higher than usual at 9.5, previously normal as of 6/21          Patient's weight history is as follows:  Wt Readings from Last 3 Encounters:  10/17/21 (!) 338 lb 6.4 oz (153.5 kg)  07/11/21 (!) 328 lb (148.8 kg)  11/16/20 (!) 331 lb 6.4 oz (150.3 kg)    Thyroid function results have been as follows:  Lab Results  Component Value Date   TSH 9.49 (H) 10/14/2021   TSH 4.31 07/10/2021   TSH 4.50 11/12/2020   TSH  4.90 08/02/2020   FREET4 0.86 10/14/2021   FREET4 1.22 07/10/2021   FREET4 1.01 08/02/2020   FREET4 0.77 05/07/2020   T3FREE 4.0 07/22/2017   T3FREE 4.1 06/02/2017   Date   free T4   TSH   T3   04/02/2015   11.38   06/12/2015  6.02    08/10/2015    9.56    10/22/2015     4.99   04/23/2017   5.76    DIABETES: Type 2 diabetes  Diagnosis date:  07/2018  History:     She had gone to her PCP for annual exam in 7/20 and was found to have a glucose of 257 and baseline A1c of 10.4 She was sent to nutritionist, started on monitoring with a One Touch meter and also prescribed metformin 1 g twice daily  Non-insulin hypoglycemic drugs: Metformin 0.5 g a.m., 1 g at dinner,  Rybelsus 14 mg in a.m.           Side effects from medications:  Mild diarrhea from Metformin  Current self management, blood sugar patterns and problems identified:  Her A1c is again up to 7.4 compared to 6.4 and previously stable below 7   She has not checked her blood sugars and she does not like fingersticks  Although she thinks she is not eating desserts regularly she is likely not controlling portions and snacks and has gained weight  Lab glucose was 155 fasting Currently taking regular metformin because she thinks the extended release causes diarrhea No side effects with 3 a day She does try to eat breakfast but sometimes will skip lunch She stated does not have any time or energy for exercise   Glucometer: One Touch.           Blood Glucose readings not available  Previously fasting: 118-125           Dietician visit: Most recent:   10/07/2018   Weight history:  Wt Readings from Last 3 Encounters:  10/17/21 (!) 338 lb 6.4 oz (153.5 kg)  07/11/21 (!) 328 lb (148.8 kg)  11/16/20 (!) 331 lb 6.4 oz (150.3 kg)   Lab Results  Component Value Date   HGBA1C 7.4 (H) 10/14/2021   HGBA1C 7.4 (H) 07/10/2021   HGBA1C 6.4 11/12/2020   Lab Results  Component Value Date   MICROALBUR 1.5 10/14/2021   LDLCALC 104  (H) 10/14/2021   CREATININE 0.69 10/14/2021     No past medical history on file.  Past Surgical History:  Procedure Laterality Date   CHOLECYSTECTOMY      Family History  Problem Relation Age of Onset   Bladder Cancer Mother        18s   Prostate cancer Maternal Uncle    Lung cancer Paternal Aunt    Lung cancer Paternal Grandmother     Social History:  reports that she quit smoking about 17 years ago. Her smoking use included cigarettes. She has never used smokeless tobacco. She reports that she does not drink alcohol and does not use drugs.  Allergies:  Allergies  Allergen Reactions   Other Swelling    Allergies as of 10/17/2021       Reactions   Other Swelling        Medication List        Accurate as of October 17, 2021 11:59 PM. If you have any questions, ask your nurse or doctor.          STOP taking these medications    Rybelsus 14 MG Tabs Generic drug: Semaglutide Stopped by: Elayne Snare, MD   Rybelsus 7 MG Tabs Generic drug: Semaglutide Stopped by: Elayne Snare, MD       TAKE these medications    atorvastatin 10 MG tablet Commonly known as: LIPITOR Take 1 tablet (10 mg total) by mouth daily.   levothyroxine 200 MCG tablet Commonly known as: SYNTHROID 1 TABLET BEFORE BREAKFAST DAILY EXCEPT HALF TABLET ON SUNDAYS   lisinopril 30 MG tablet Commonly known as: ZESTRIL Take 1 tablet (30 mg total) by mouth daily. What changed: how much to take   meloxicam 15 MG tablet Commonly known as: MOBIC Take 15 mg by mouth daily.   metFORMIN 500 MG 24 hr tablet Commonly known as: GLUCOPHAGE-XR Take 4 tablets (2,000 mg total) by mouth daily with supper.   tirzepatide 5 MG/0.5ML Pen Commonly known as: MOUNJARO Inject 5 mg into the skin once a week. Started by: Elayne Snare, MD           Review of Systems   LIPIDS: Treated with atorvastatin  LDL 145 at baseline  LDL is currently not well controlled She has not missed her medication  but diet has been inconsistent Triglycerides are currently normal  Lab Results  Component Value Date   CHOL 171 10/14/2021   CHOL 137 08/02/2020   CHOL 148 09/23/2019   Lab Results  Component Value Date  HDL 40.20 10/14/2021   HDL 36.50 (L) 08/02/2020   HDL 33.60 (L) 09/23/2019   Lab Results  Component Value Date   LDLCALC 104 (H) 10/14/2021   LDLCALC 70 08/02/2020   LDLCALC 83 09/23/2019   Lab Results  Component Value Date   TRIG 134.0 10/14/2021   TRIG 149.0 08/02/2020   TRIG 156.0 (H) 09/23/2019   Lab Results  Component Value Date   CHOLHDL 4 10/14/2021   CHOLHDL 4 08/02/2020   CHOLHDL 4 09/23/2019   No results found for: "LDLDIRECT"   Treated with lisinopril 30 mg for history of hypertension This is followed by her PCP   BP Readings from Last 3 Encounters:  10/17/21 118/84  07/11/21 126/88  11/16/20 128/83      Examination:    BP 118/84   Pulse 83   Ht 5' 6.5" (1.689 m)   Wt (!) 338 lb 6.4 oz (153.5 kg)   SpO2 99%   BMI 53.80 kg/m   No ankle edema  Assessment:  HYPOTHYROIDISM, diagnosed in 2014 also and with no typical symptoms at baseline  Her levothyroxine dose is again 200 mcg, 6 tablets a week Do not add the extra half tablet on Sundays as instructed Although likely asymptomatic her TSH is now 9.5   DIABETES with morbid obesity:  She is on Metformin 1500 mg a day and Rybelsus 14 mg daily Blood sugars are still relatively higher and she has gained weight Currently not very motivated to lose weight or exercise as well as likely not watching her diet overall Again not monitoring sugars at home, fasting reading 155 now She has refused to see the dietitian previously  Hypertension: Managed by PCP: Diastolic blood pressure high normal   LIPIDS: Not well controlled and going up likely from poor diet Likely needs to be on a higher dose of Lipitor    PLAN:   LEVOTHYROXINE 200 mcg DAILY To take before breakfast  She will try  Mounjaro instead of Rybelsus 14 mg daily after discussing that she will need more active GLP-1 drug and shorter the convenience and minimal discomfort of doing a weekly autoinjector device Discussed with the patient the action of GIP/GLP-1 drugs, the effects on pancreatic and liver function, effects on brain and stomach with improved satiety, slowing gastric emptying, improving satiety and reducing liver glucose output.  Discussed the effects on promoting weight loss. Explained possible side effects of MOUNJARO, most commonly nausea that usually improves over time; discussed safety information in package insert.  Demonstrated the medication injection device and injection technique to the patient.  Showed patient the injection sites for his medication To start with 2.5 mg dosage weekly for the first 2 injections and then increase the dose to 5 mg weekly Sample given to take for 2.5 mg Patient brochure on Mounjaro and explained use of the available co-pay card  Discussed blood sugar targets She needs to cut back on portions at meals and snacks Start walking or other exercise whenever she can find time  Atorvastatin 20 mg daily and she can use the current supply at home  Follow-up in 4 months  Patient Instructions  Mounjaro 2.5 weekly for 2 shots then 5 mg x 4 then 10mg  weekly  Walk daily  Thyroid daily  2 Lipitor daily     Elayne Snare 10/18/2021, 12:16 PM    Note: This office note was prepared with Dragon voice recognition system technology. Any transcriptional errors that result from this process are unintentional.

## 2021-12-01 DIAGNOSIS — J019 Acute sinusitis, unspecified: Secondary | ICD-10-CM | POA: Diagnosis not present

## 2021-12-01 DIAGNOSIS — J40 Bronchitis, not specified as acute or chronic: Secondary | ICD-10-CM | POA: Diagnosis not present

## 2021-12-08 ENCOUNTER — Other Ambulatory Visit: Payer: Self-pay | Admitting: Endocrinology

## 2021-12-11 ENCOUNTER — Telehealth: Payer: Self-pay

## 2021-12-11 DIAGNOSIS — E1165 Type 2 diabetes mellitus with hyperglycemia: Secondary | ICD-10-CM

## 2021-12-11 MED ORDER — RYBELSUS 14 MG PO TABS: 1.0000 | ORAL_TABLET | Freq: Every day | ORAL | 2 refills | Status: DC

## 2021-12-11 NOTE — Telephone Encounter (Signed)
We can try to send Rybelsus 14 mg again until she can get [facility] again.  I would probably send 1 month at the time with 2 refills, but okay to send a 40-month supply if cheaper for her.

## 2021-12-11 NOTE — Telephone Encounter (Signed)
Pt contacted office to advise pharmacy on backorder for [facility]. Pt requesting an alternative or rx for Rybelsus. Please advise.

## 2021-12-11 NOTE — Telephone Encounter (Signed)
Pt contacted and advised she has some Rybelsus on hand so she is ok for now but rx sent to the pharmacy.

## 2021-12-18 ENCOUNTER — Emergency Department (HOSPITAL_COMMUNITY): Payer: BC Managed Care – PPO

## 2021-12-18 ENCOUNTER — Inpatient Hospital Stay (HOSPITAL_COMMUNITY)
Admission: EM | Admit: 2021-12-18 | Discharge: 2021-12-20 | DRG: 854 | Disposition: A | Discharge status: Home / Self Care | Payer: BC Managed Care – PPO | Attending: Internal Medicine | Admitting: Internal Medicine

## 2021-12-18 ENCOUNTER — Other Ambulatory Visit: Payer: Self-pay

## 2021-12-18 DIAGNOSIS — E876 Hypokalemia: Secondary | ICD-10-CM | POA: Diagnosis not present

## 2021-12-18 DIAGNOSIS — N179 Acute kidney failure, unspecified: Secondary | ICD-10-CM | POA: Diagnosis not present

## 2021-12-18 DIAGNOSIS — N390 Urinary tract infection, site not specified: Secondary | ICD-10-CM | POA: Diagnosis not present

## 2021-12-18 DIAGNOSIS — Z8052 Family history of malignant neoplasm of bladder: Secondary | ICD-10-CM

## 2021-12-18 DIAGNOSIS — E871 Hypo-osmolality and hyponatremia: Secondary | ICD-10-CM | POA: Diagnosis present

## 2021-12-18 DIAGNOSIS — R350 Frequency of micturition: Secondary | ICD-10-CM | POA: Diagnosis not present

## 2021-12-18 DIAGNOSIS — A419 Sepsis, unspecified organism: Principal | ICD-10-CM | POA: Diagnosis present

## 2021-12-18 DIAGNOSIS — Z79890 Hormone replacement therapy (postmenopausal): Secondary | ICD-10-CM | POA: Diagnosis not present

## 2021-12-18 DIAGNOSIS — E872 Acidosis, unspecified: Secondary | ICD-10-CM | POA: Diagnosis present

## 2021-12-18 DIAGNOSIS — Z801 Family history of malignant neoplasm of trachea, bronchus and lung: Secondary | ICD-10-CM | POA: Diagnosis not present

## 2021-12-18 DIAGNOSIS — B961 Klebsiella pneumoniae [K. pneumoniae] as the cause of diseases classified elsewhere: Secondary | ICD-10-CM | POA: Diagnosis not present

## 2021-12-18 DIAGNOSIS — Z79899 Other long term (current) drug therapy: Secondary | ICD-10-CM | POA: Diagnosis not present

## 2021-12-18 DIAGNOSIS — E119 Type 2 diabetes mellitus without complications: Secondary | ICD-10-CM

## 2021-12-18 DIAGNOSIS — E7800 Pure hypercholesterolemia, unspecified: Secondary | ICD-10-CM | POA: Diagnosis not present

## 2021-12-18 DIAGNOSIS — N201 Calculus of ureter: Secondary | ICD-10-CM | POA: Diagnosis present

## 2021-12-18 DIAGNOSIS — Z87891 Personal history of nicotine dependence: Secondary | ICD-10-CM | POA: Diagnosis not present

## 2021-12-18 DIAGNOSIS — E039 Hypothyroidism, unspecified: Secondary | ICD-10-CM | POA: Diagnosis present

## 2021-12-18 DIAGNOSIS — N1330 Unspecified hydronephrosis: Secondary | ICD-10-CM | POA: Diagnosis not present

## 2021-12-18 DIAGNOSIS — R000 Tachycardia, unspecified: Secondary | ICD-10-CM | POA: Diagnosis not present

## 2021-12-18 DIAGNOSIS — R509 Fever, unspecified: Secondary | ICD-10-CM | POA: Diagnosis not present

## 2021-12-18 DIAGNOSIS — Z1152 Encounter for screening for COVID-19: Secondary | ICD-10-CM

## 2021-12-18 DIAGNOSIS — N3941 Urge incontinence: Secondary | ICD-10-CM | POA: Diagnosis not present

## 2021-12-18 DIAGNOSIS — Z7984 Long term (current) use of oral hypoglycemic drugs: Secondary | ICD-10-CM

## 2021-12-18 DIAGNOSIS — N3080 Other cystitis without hematuria: Secondary | ICD-10-CM | POA: Diagnosis not present

## 2021-12-18 DIAGNOSIS — R112 Nausea with vomiting, unspecified: Secondary | ICD-10-CM | POA: Diagnosis present

## 2021-12-18 DIAGNOSIS — Z791 Long term (current) use of non-steroidal anti-inflammatories (NSAID): Secondary | ICD-10-CM

## 2021-12-18 DIAGNOSIS — N202 Calculus of kidney with calculus of ureter: Secondary | ICD-10-CM | POA: Diagnosis not present

## 2021-12-18 DIAGNOSIS — Z6841 Body Mass Index (BMI) 40.0 and over, adult: Secondary | ICD-10-CM

## 2021-12-18 DIAGNOSIS — R6883 Chills (without fever): Secondary | ICD-10-CM | POA: Diagnosis not present

## 2021-12-18 DIAGNOSIS — E1165 Type 2 diabetes mellitus with hyperglycemia: Secondary | ICD-10-CM | POA: Diagnosis not present

## 2021-12-18 DIAGNOSIS — N200 Calculus of kidney: Secondary | ICD-10-CM | POA: Diagnosis not present

## 2021-12-18 DIAGNOSIS — R059 Cough, unspecified: Secondary | ICD-10-CM | POA: Diagnosis not present

## 2021-12-18 HISTORY — DX: Type 2 diabetes mellitus without complications: E11.9

## 2021-12-18 HISTORY — DX: Morbid (severe) obesity due to excess calories: E66.01

## 2021-12-18 HISTORY — DX: Pure hypercholesterolemia, unspecified: E78.00

## 2021-12-18 LAB — URINALYSIS, ROUTINE W REFLEX MICROSCOPIC
Bilirubin Urine: NEGATIVE
Glucose, UA: NEGATIVE mg/dL
Ketones, ur: NEGATIVE mg/dL
Nitrite: POSITIVE — AB
Protein, ur: 100 mg/dL — AB
Specific Gravity, Urine: 1.017 (ref 1.005–1.030)
WBC, UA: >50 WBC/hpf — ABNORMAL HIGH (ref 0–5)
pH: 5 (ref 5.0–8.0)

## 2021-12-18 LAB — CBC WITH DIFFERENTIAL/PLATELET
Abs Immature Granulocytes: 0.06 10*3/uL (ref 0.00–0.07)
Basophils Absolute: 0 10*3/uL (ref 0.0–0.1)
Basophils Relative: 0 %
Eosinophils Absolute: 0.1 10*3/uL (ref 0.0–0.5)
Eosinophils Relative: 1 %
HCT: 33.8 % — ABNORMAL LOW (ref 36.0–46.0)
Hemoglobin: 11.5 g/dL — ABNORMAL LOW (ref 12.0–15.0)
Immature Granulocytes: 1 %
Lymphocytes Relative: 11 %
Lymphs Abs: 1.3 10*3/uL (ref 0.7–4.0)
MCH: 30.3 pg (ref 26.0–34.0)
MCHC: 34 g/dL (ref 30.0–36.0)
MCV: 89.2 fL (ref 80.0–100.0)
Monocytes Absolute: 1.9 10*3/uL — ABNORMAL HIGH (ref 0.1–1.0)
Monocytes Relative: 17 %
Neutro Abs: 7.8 10*3/uL — ABNORMAL HIGH (ref 1.7–7.7)
Neutrophils Relative %: 70 %
Platelets: 234 10*3/uL (ref 150–400)
RBC: 3.79 MIL/uL — ABNORMAL LOW (ref 3.87–5.11)
RDW: 12.4 % (ref 11.5–15.5)
WBC: 11.2 10*3/uL — ABNORMAL HIGH (ref 4.0–10.5)
nRBC: 0 % (ref 0.0–0.2)

## 2021-12-18 LAB — I-STAT VENOUS BLOOD GAS, ED
Acid-Base Excess: 0 mmol/L (ref 0.0–2.0)
Bicarbonate: 18.4 mmol/L — ABNORMAL LOW (ref 20.0–28.0)
Calcium, Ion: 0.7 mmol/L — CL (ref 1.15–1.40)
HCT: 34 % — ABNORMAL LOW (ref 36.0–46.0)
Hemoglobin: 11.6 g/dL — ABNORMAL LOW (ref 12.0–15.0)
O2 Saturation: 100 %
Potassium: 3.3 mmol/L — ABNORMAL LOW (ref 3.5–5.1)
Sodium: 125 mmol/L — ABNORMAL LOW (ref 135–145)
TCO2: 19 mmol/L — ABNORMAL LOW (ref 22–32)
pCO2, Ven: 16.6 mmHg — CL (ref 44–60)
pH, Ven: 7.653 (ref 7.25–7.43)
pO2, Ven: 132 mmHg — ABNORMAL HIGH (ref 32–45)

## 2021-12-18 LAB — BASIC METABOLIC PANEL
Anion gap: 13 (ref 5–15)
BUN: 13 mg/dL (ref 6–20)
CO2: 22 mmol/L (ref 22–32)
Calcium: 7.9 mg/dL — ABNORMAL LOW (ref 8.9–10.3)
Chloride: 94 mmol/L — ABNORMAL LOW (ref 98–111)
Creatinine, Ser: 1.39 mg/dL — ABNORMAL HIGH (ref 0.44–1.00)
GFR, Estimated: 47 mL/min — ABNORMAL LOW (ref >60)
Glucose, Bld: 325 mg/dL — ABNORMAL HIGH (ref 70–99)
Potassium: 3.3 mmol/L — ABNORMAL LOW (ref 3.5–5.1)
Sodium: 129 mmol/L — ABNORMAL LOW (ref 135–145)

## 2021-12-18 LAB — RESP PANEL BY RT-PCR (RSV, FLU A&B, COVID)  RVPGX2
Influenza A by PCR: NEGATIVE
Influenza B by PCR: NEGATIVE
Resp Syncytial Virus by PCR: NEGATIVE
SARS Coronavirus 2 by RT PCR: NEGATIVE

## 2021-12-18 LAB — I-STAT BETA HCG BLOOD, ED (MC, WL, AP ONLY): I-stat hCG, quantitative: 7.9 m[IU]/mL — ABNORMAL HIGH (ref <5)

## 2021-12-18 MED ORDER — SODIUM CHLORIDE 0.9 % IV SOLN
1.0000 g | Freq: Once | INTRAVENOUS | Status: AC
  Administered 2021-12-18: 1 g via INTRAVENOUS
  Filled 2021-12-18: qty 10

## 2021-12-18 MED ORDER — ACETAMINOPHEN 500 MG PO TABS
1000.0000 mg | ORAL_TABLET | ORAL | Status: AC
  Administered 2021-12-18: 1000 mg via ORAL
  Filled 2021-12-18: qty 2

## 2021-12-18 MED ORDER — SODIUM CHLORIDE 0.9 % IV BOLUS
1000.0000 mL | Freq: Once | INTRAVENOUS | Status: AC
  Administered 2021-12-18: 1000 mL via INTRAVENOUS

## 2021-12-18 NOTE — ED Provider Triage Note (Signed)
Emergency Medicine Provider Triage Evaluation Note  Joan Walker , a 46 y.o. female  was evaluated in triage.  Pt complains of cough, fatigue, chills and nausea for 4 days states she has had a fever 103, last tylenol 8am.   States she's been using tyleonl and ibuprofen the past few days.   No urinary sx or abd pain.   Review of Systems  Positive: Cough, fatigue, fever, congestion Negative: Vomiting.   Physical Exam  BP 139/68   Pulse (!) 111   Temp 98.1 F (36.7 C)   Resp 17   Ht 5\' 6"  (1.676 m)   Wt (!) 149.7 kg   SpO2 98%   BMI 53.26 kg/m  Gen:   Awake, no distress   Resp:  Normal effort  MSK:   Moves extremities without difficulty  Other:  Abd soft nttp  Medical Decision Making  Medically screening exam initiated at 8:58 PM.  Appropriate orders placed.  Dolney was informed that the remainder of the evaluation will be completed by another provider, this initial triage assessment does not replace that evaluation, and the importance of remaining in the ED until their evaluation is complete.  CXR, urine, PCR covid   Leitha Bleak, Gailen Shelter 12/18/21 2100

## 2021-12-18 NOTE — ED Triage Notes (Signed)
The pt has been ill for 4 days.  She had an elevated temp on Friday she was out of work until today  she started feeling worse at work  c/o some difficulty breathing  n and v on Sunday but not since tyhen

## 2021-12-18 NOTE — ED Provider Notes (Signed)
[facility] EMERGENCY DEPARTMENT Provider Note   CSN: [id number] Arrival date & time: 12/18/21  2040     History  Chief Complaint  Patient presents with   Chest Pain    Joan Walker is a 46 y.o. female.  The history is provided by the patient and medical records.  Chest Pain Joan Walker is a 46 y.o. female who presents to the Emergency Department complaining of fever.  She presents to the ED for evaluation of fever to 103 on Sunday.  Today she developed worsening chills. She went to primary care and had neg covid and flu today.   Has not been taking metformin until Tuesday.   Has associated fatigue, sob.  Has mild cough - just tonight. No chest pain.  Has sore throat.  Vomited on Sunday.  No abdominal pain.  Has occasional right sided low back pain. No dysuria. No vaginal discharge.   No leg swelling/pain.  No hormone use.  No hx/o DVT/PE.      Home Medications Prior to Admission medications   Medication Sig Start Date End Date Taking? Authorizing Provider  atorvastatin (LIPITOR) 10 MG tablet Take 1 tablet (10 mg total) by mouth daily. 02/04/21   Reather Littler, MD  levothyroxine (SYNTHROID) 200 MCG tablet 1 TABLET BEFORE BREAKFAST DAILY EXCEPT HALF TABLET ON SUNDAYS 08/06/21   Reather Littler, MD  lisinopril (ZESTRIL) 30 MG tablet Take 1 tablet (30 mg total) by mouth daily. Patient taking differently: Take 20 mg by mouth daily. 02/16/19   Reather Littler, MD  meloxicam (MOBIC) 15 MG tablet Take 15 mg by mouth daily. 07/18/20   [provider]  metFORMIN (GLUCOPHAGE-XR) 500 MG 24 hr tablet Take 4 tablets (2,000 mg total) by mouth daily with supper. 07/11/21   Reather Littler, MD  Semaglutide (RYBELSUS) 14 MG TABS Take 1 tablet (14 mg total) by mouth daily. 12/11/21   Carlus Pavlov, MD  tirzepatide [facility]) 5 MG/0.5ML Pen INJECT 5MG  INTO THE SKIN ONCE A WEEK 12/09/21   Reather Littler, MD      Allergies    Other    Review of Systems   Review of Systems   Cardiovascular:  Positive for chest pain.  All other systems reviewed and are negative.   Physical Exam Updated Vital Signs BP 125/69   Pulse 88   Temp 97.6 F (36.4 C) (Oral)   Resp 10   Ht 5\' 6"  (1.676 m)   Wt (!) 149.7 kg   SpO2 100%   BMI 53.26 kg/m  Physical Exam Vitals and nursing note reviewed.  Constitutional:      Appearance: She is well-developed.  HENT:     Head: Normocephalic and atraumatic.  Cardiovascular:     Rate and Rhythm: Normal rate and regular rhythm.     Heart sounds: No murmur heard. Pulmonary:     Effort: Pulmonary effort is normal. No respiratory distress.     Breath sounds: Normal breath sounds.  Abdominal:     Palpations: Abdomen is soft.     Tenderness: There is no abdominal tenderness. There is no right CVA tenderness, left CVA tenderness, guarding or rebound.  Musculoskeletal:        General: No tenderness.  Skin:    General: Skin is warm and dry.  Neurological:     Mental Status: She is alert and oriented to person, place, and time.  Psychiatric:        Behavior: Behavior normal.     ED Results /  Procedures / Treatments   Labs (all labs ordered are listed, but only abnormal results are displayed) Labs Reviewed  URINALYSIS, ROUTINE W REFLEX MICROSCOPIC - Abnormal; Notable for the following components:      Result Value   APPearance CLOUDY (*)    Hgb urine dipstick SMALL (*)    Protein, ur 100 (*)    Nitrite POSITIVE (*)    Leukocytes,Ua MODERATE (*)    WBC, UA >50 (*)    Bacteria, UA MANY (*)    All other components within normal limits  BASIC METABOLIC PANEL - Abnormal; Notable for the following components:   Sodium 129 (*)    Potassium 3.3 (*)    Chloride 94 (*)    Glucose, Bld 325 (*)    Creatinine, Ser 1.39 (*)    Calcium 7.9 (*)    GFR, Estimated 47 (*)    All other components within normal limits  CBC WITH DIFFERENTIAL/PLATELET - Abnormal; Notable for the following components:   WBC 11.2 (*)    RBC 3.79 (*)     Hemoglobin 11.5 (*)    HCT 33.8 (*)    Neutro Abs 7.8 (*)    Monocytes Absolute 1.9 (*)    All other components within normal limits  HEPATIC FUNCTION PANEL - Abnormal; Notable for the following components:   Total Protein 6.3 (*)    Albumin 2.8 (*)    All other components within normal limits  LACTIC ACID, PLASMA - Abnormal; Notable for the following components:   Lactic Acid, Venous 2.4 (*)    All other components within normal limits  LACTIC ACID, PLASMA - Abnormal; Notable for the following components:   Lactic Acid, Venous 2.0 (*)    All other components within normal limits  I-STAT VENOUS BLOOD GAS, ED - Abnormal; Notable for the following components:   pH, Ven 7.653 (*)    pCO2, Ven 16.6 (*)    pO2, Ven 132 (*)    Bicarbonate 18.4 (*)    TCO2 19 (*)    Sodium 125 (*)    Potassium 3.3 (*)    Calcium, Ion 0.70 (*)    HCT 34.0 (*)    Hemoglobin 11.6 (*)    All other components within normal limits  I-STAT BETA HCG BLOOD, ED ([facility], [facility], [facility] ONLY) - Abnormal; Notable for the following components:   I-stat hCG, quantitative 7.9 (*)    All other components within normal limits  CBG MONITORING, ED - Abnormal; Notable for the following components:   Glucose-Capillary 241 (*)    All other components within normal limits  RESP PANEL BY RT-PCR (RSV, FLU A&B, COVID)  [id number]  URINE CULTURE  BLOOD GAS, VENOUS  CBC WITH DIFFERENTIAL/PLATELET  COMPREHENSIVE METABOLIC PANEL  MAGNESIUM  PROTIME-INR  LACTIC ACID, PLASMA  POC URINE PREG, ED    EKG EKG Interpretation  Date/Time:  Wednesday December 18 2021 21:06:48 EST Ventricular Rate:  110 PR Interval:  112 QRS Duration: 84 QT Interval:  326 QTC Calculation: 441 R Axis:   18 Text Interpretation: Sinus tachycardia with Premature atrial complexes Otherwise normal ECG No previous ECGs available Confirmed by Tilden Fossa [phone]) on 12/18/2021 11:00:27 PM  Radiology CT Renal Stone Study  Result Date: 12/19/2021 CLINICAL DATA:   Cough, fatigue, chills and nausea. EXAM: CT ABDOMEN AND PELVIS WITHOUT CONTRAST TECHNIQUE: Multidetector CT imaging of the abdomen and pelvis was performed following the standard protocol without IV contrast. RADIATION DOSE REDUCTION: This exam was performed according to the  departmental dose-optimization program which includes automated exposure control, adjustment of the mA and/or kV according to patient size and/or use of iterative reconstruction technique. COMPARISON:  None Available. FINDINGS: Lower chest: No acute abnormality. Hepatobiliary: No focal liver abnormality is seen. Status post cholecystectomy. No biliary dilatation. Pancreas: Unremarkable. No pancreatic ductal dilatation or surrounding inflammatory changes. Spleen: Normal in size without focal abnormality. Adrenals/Urinary Tract: Adrenal glands are unremarkable. Kidneys are normal in size, without focal lesions. A 3 mm nonobstructing renal calculus is seen within the lower pole of the right kidney. An 8 mm obstructing renal stone is seen within the proximal left ureter with moderate severity hydronephrosis and hydroureter. Bladder is unremarkable. Stomach/Bowel: Stomach is within normal limits. Appendix appears normal. No evidence of bowel wall thickening, distention, or inflammatory changes. Vascular/Lymphatic: No significant vascular findings are present. No enlarged abdominal or pelvic lymph nodes. Reproductive: Uterus and bilateral adnexa are unremarkable. Other: No abdominal wall hernia or abnormality. No abdominopelvic ascites. Musculoskeletal: No acute or significant osseous findings. IMPRESSION: 1. 8 mm obstructing renal stone within the proximal left ureter. 2. 3 mm nonobstructing right renal calculus. 3. Evidence of prior cholecystectomy. Electronically Signed   By: Aram Candela M.D.   On: 12/19/2021 02:30   DG Chest 2 View  Result Date: 12/18/2021 CLINICAL DATA:  Cough and fever EXAM: CHEST - 2 VIEW COMPARISON:  Radiographs  09/26/2011 FINDINGS: The heart size and mediastinal contours are within normal limits. Both lungs are clear. The visualized skeletal structures are unremarkable. IMPRESSION: No active cardiopulmonary disease. Electronically Signed   By: Minerva Fester M.D.   On: 12/18/2021 21:35    Procedures Procedures   CRITICAL CARE Performed by: Tilden Fossa   Total critical care time: 40 minutes  Critical care time was exclusive of separately billable procedures and treating other patients.  Critical care was necessary to treat or prevent imminent or life-threatening deterioration.  Critical care was time spent personally by me on the following activities: development of treatment plan with patient and/or surrogate as well as nursing, discussions with consultants, evaluation of patient's response to treatment, examination of patient, obtaining history from patient or surrogate, ordering and performing treatments and interventions, ordering and review of laboratory studies, ordering and review of radiographic studies, pulse oximetry and re-evaluation of patient's condition.  Medications Ordered in ED Medications  acetaminophen (TYLENOL) tablet 650 mg (has no administration in time range)    Or  acetaminophen (TYLENOL) suppository 650 mg (has no administration in time range)  lactated ringers infusion ( Intravenous New Bag/Given 12/19/21 0401)  naloxone (NARCAN) injection 0.4 mg (has no administration in time range)  fentaNYL (SUBLIMAZE) injection 50 mcg (has no administration in time range)  ondansetron (ZOFRAN) injection 4 mg (has no administration in time range)  cefTRIAXone (ROCEPHIN) 1 g in sodium chloride 0.9 % 100 mL IVPB (has no administration in time range)  insulin aspart (novoLOG) injection 0-6 Units (2 Units Subcutaneous Given 12/19/21 0404)  potassium chloride 10 mEq in 100 mL IVPB (has no administration in time range)  acetaminophen (TYLENOL) tablet 1,000 mg (1,000 mg Oral Given  12/18/21 2107)  sodium chloride 0.9 % bolus 1,000 mL (0 mLs Intravenous Stopped 12/19/21 0100)  cefTRIAXone (ROCEPHIN) 1 g in sodium chloride 0.9 % 100 mL IVPB (0 g Intravenous Stopped 12/19/21 0013)  sodium chloride 0.9 % bolus 1,000 mL (1,000 mLs Intravenous New Bag/Given 12/19/21 0306)    ED Course/ Medical Decision Making/ A&P  Medical Decision Making Amount and/or Complexity of Data Reviewed Labs: ordered. Radiology: ordered.  Risk Decision regarding hospitalization.   Patient with history of diabetes here for evaluation of fever and chills since Sunday.  UA is concerning for UTI.  CBC with mild leukocytosis.  BMP with hyponatremia, AKI.  Lactic acid is mildly elevated.  She was treated with IV fluids and antibiotics.  Patient does report some right flank pain when she moves, describes as mild.  Given these findings a CT stone study was obtained.  CT demonstrates a left proximal ureteral stone.  Concern for infected stone given patient's symptoms.  Discussed with Dr. Alycia Rossetti with urology-will see the patient in consult, recommends transfer to [facility] and admission to the medicine service.  Hospitalist consulted for admission.  Patient updated findings of studies recommendation for admission and she is in agreement with treatment plan.       Final Clinical Impression(s) / ED Diagnoses Final diagnoses:  Acute UTI  Ureteral stone    Rx / DC Orders ED Discharge Orders     None         Tilden Fossa, MD 12/19/21 [phone]

## 2021-12-18 NOTE — ED Triage Notes (Signed)
The pt h a neg covid and flu neg at urgent care [place] today

## 2021-12-19 ENCOUNTER — Encounter (HOSPITAL_COMMUNITY)
Admission: EM | Disposition: A | Discharge status: Home / Self Care | Payer: Self-pay | Source: Home / Self Care | Attending: Internal Medicine

## 2021-12-19 ENCOUNTER — Encounter (HOSPITAL_COMMUNITY): Payer: Self-pay | Admitting: Internal Medicine

## 2021-12-19 ENCOUNTER — Inpatient Hospital Stay (HOSPITAL_COMMUNITY): Payer: BC Managed Care – PPO | Admitting: Anesthesiology

## 2021-12-19 ENCOUNTER — Inpatient Hospital Stay (HOSPITAL_COMMUNITY): Payer: BC Managed Care – PPO

## 2021-12-19 ENCOUNTER — Emergency Department (HOSPITAL_COMMUNITY): Payer: BC Managed Care – PPO

## 2021-12-19 DIAGNOSIS — E119 Type 2 diabetes mellitus without complications: Secondary | ICD-10-CM | POA: Diagnosis not present

## 2021-12-19 DIAGNOSIS — Z7984 Long term (current) use of oral hypoglycemic drugs: Secondary | ICD-10-CM | POA: Diagnosis not present

## 2021-12-19 DIAGNOSIS — E872 Acidosis, unspecified: Secondary | ICD-10-CM

## 2021-12-19 DIAGNOSIS — N1330 Unspecified hydronephrosis: Secondary | ICD-10-CM | POA: Diagnosis not present

## 2021-12-19 DIAGNOSIS — A419 Sepsis, unspecified organism: Secondary | ICD-10-CM | POA: Diagnosis not present

## 2021-12-19 DIAGNOSIS — Z87891 Personal history of nicotine dependence: Secondary | ICD-10-CM | POA: Diagnosis not present

## 2021-12-19 DIAGNOSIS — Z801 Family history of malignant neoplasm of trachea, bronchus and lung: Secondary | ICD-10-CM | POA: Diagnosis not present

## 2021-12-19 DIAGNOSIS — N179 Acute kidney failure, unspecified: Secondary | ICD-10-CM | POA: Diagnosis present

## 2021-12-19 DIAGNOSIS — R112 Nausea with vomiting, unspecified: Secondary | ICD-10-CM | POA: Diagnosis present

## 2021-12-19 DIAGNOSIS — Z6841 Body Mass Index (BMI) 40.0 and over, adult: Secondary | ICD-10-CM | POA: Diagnosis not present

## 2021-12-19 DIAGNOSIS — Z79890 Hormone replacement therapy (postmenopausal): Secondary | ICD-10-CM | POA: Diagnosis not present

## 2021-12-19 DIAGNOSIS — Z791 Long term (current) use of non-steroidal anti-inflammatories (NSAID): Secondary | ICD-10-CM | POA: Diagnosis not present

## 2021-12-19 DIAGNOSIS — E871 Hypo-osmolality and hyponatremia: Secondary | ICD-10-CM

## 2021-12-19 DIAGNOSIS — N3080 Other cystitis without hematuria: Secondary | ICD-10-CM | POA: Diagnosis not present

## 2021-12-19 DIAGNOSIS — Z79899 Other long term (current) drug therapy: Secondary | ICD-10-CM | POA: Diagnosis not present

## 2021-12-19 DIAGNOSIS — N390 Urinary tract infection, site not specified: Principal | ICD-10-CM

## 2021-12-19 DIAGNOSIS — E876 Hypokalemia: Secondary | ICD-10-CM | POA: Diagnosis present

## 2021-12-19 DIAGNOSIS — E7800 Pure hypercholesterolemia, unspecified: Secondary | ICD-10-CM | POA: Diagnosis present

## 2021-12-19 DIAGNOSIS — E039 Hypothyroidism, unspecified: Secondary | ICD-10-CM

## 2021-12-19 DIAGNOSIS — Z8052 Family history of malignant neoplasm of bladder: Secondary | ICD-10-CM | POA: Diagnosis not present

## 2021-12-19 DIAGNOSIS — B961 Klebsiella pneumoniae [K. pneumoniae] as the cause of diseases classified elsewhere: Secondary | ICD-10-CM | POA: Diagnosis not present

## 2021-12-19 DIAGNOSIS — R350 Frequency of micturition: Secondary | ICD-10-CM | POA: Diagnosis not present

## 2021-12-19 DIAGNOSIS — N201 Calculus of ureter: Secondary | ICD-10-CM | POA: Diagnosis not present

## 2021-12-19 DIAGNOSIS — Z1152 Encounter for screening for COVID-19: Secondary | ICD-10-CM | POA: Diagnosis not present

## 2021-12-19 DIAGNOSIS — N3941 Urge incontinence: Secondary | ICD-10-CM | POA: Diagnosis not present

## 2021-12-19 HISTORY — PX: CYSTOSCOPY W/ URETERAL STENT PLACEMENT: SHX1429

## 2021-12-19 LAB — CBC WITH DIFFERENTIAL/PLATELET
Abs Immature Granulocytes: 0.08 10*3/uL — ABNORMAL HIGH (ref 0.00–0.07)
Basophils Absolute: 0.1 10*3/uL (ref 0.0–0.1)
Basophils Relative: 1 %
Eosinophils Absolute: 0.2 10*3/uL (ref 0.0–0.5)
Eosinophils Relative: 2 %
HCT: 34.1 % — ABNORMAL LOW (ref 36.0–46.0)
Hemoglobin: 11.2 g/dL — ABNORMAL LOW (ref 12.0–15.0)
Immature Granulocytes: 1 %
Lymphocytes Relative: 14 %
Lymphs Abs: 1.7 10*3/uL (ref 0.7–4.0)
MCH: 29.8 pg (ref 26.0–34.0)
MCHC: 32.8 g/dL (ref 30.0–36.0)
MCV: 90.7 fL (ref 80.0–100.0)
Monocytes Absolute: 2 10*3/uL — ABNORMAL HIGH (ref 0.1–1.0)
Monocytes Relative: 17 %
Neutro Abs: 7.9 10*3/uL — ABNORMAL HIGH (ref 1.7–7.7)
Neutrophils Relative %: 65 %
Platelets: 224 10*3/uL (ref 150–400)
RBC: 3.76 MIL/uL — ABNORMAL LOW (ref 3.87–5.11)
RDW: 12.4 % (ref 11.5–15.5)
WBC: 11.9 10*3/uL — ABNORMAL HIGH (ref 4.0–10.5)
nRBC: 0 % (ref 0.0–0.2)

## 2021-12-19 LAB — HEPATIC FUNCTION PANEL
ALT: 32 U/L (ref 0–44)
AST: 36 U/L (ref 15–41)
Albumin: 2.8 g/dL — ABNORMAL LOW (ref 3.5–5.0)
Alkaline Phosphatase: 71 U/L (ref 38–126)
Bilirubin, Direct: 0.2 mg/dL (ref 0.0–0.2)
Indirect Bilirubin: 0.4 mg/dL (ref 0.3–0.9)
Total Bilirubin: 0.6 mg/dL (ref 0.3–1.2)
Total Protein: 6.3 g/dL — ABNORMAL LOW (ref 6.5–8.1)

## 2021-12-19 LAB — CREATININE, URINE, RANDOM: Creatinine, Urine: 248 mg/dL

## 2021-12-19 LAB — COMPREHENSIVE METABOLIC PANEL
ALT: 34 U/L (ref 0–44)
AST: 33 U/L (ref 15–41)
Albumin: 2.8 g/dL — ABNORMAL LOW (ref 3.5–5.0)
Alkaline Phosphatase: 71 U/L (ref 38–126)
Anion gap: 20 — ABNORMAL HIGH (ref 5–15)
BUN: 15 mg/dL (ref 6–20)
CO2: 17 mmol/L — ABNORMAL LOW (ref 22–32)
Calcium: 7.6 mg/dL — ABNORMAL LOW (ref 8.9–10.3)
Chloride: 95 mmol/L — ABNORMAL LOW (ref 98–111)
Creatinine, Ser: 1.37 mg/dL — ABNORMAL HIGH (ref 0.44–1.00)
GFR, Estimated: 48 mL/min — ABNORMAL LOW (ref >60)
Glucose, Bld: 255 mg/dL — ABNORMAL HIGH (ref 70–99)
Potassium: 3.3 mmol/L — ABNORMAL LOW (ref 3.5–5.1)
Sodium: 132 mmol/L — ABNORMAL LOW (ref 135–145)
Total Bilirubin: 0.5 mg/dL (ref 0.3–1.2)
Total Protein: 6.2 g/dL — ABNORMAL LOW (ref 6.5–8.1)

## 2021-12-19 LAB — GLUCOSE, CAPILLARY
Glucose-Capillary: 224 mg/dL — ABNORMAL HIGH (ref 70–99)
Glucose-Capillary: 289 mg/dL — ABNORMAL HIGH (ref 70–99)
Glucose-Capillary: 359 mg/dL — ABNORMAL HIGH (ref 70–99)
Glucose-Capillary: 392 mg/dL — ABNORMAL HIGH (ref 70–99)
Glucose-Capillary: 425 mg/dL — ABNORMAL HIGH (ref 70–99)

## 2021-12-19 LAB — PROTIME-INR
INR: 1 (ref 0.8–1.2)
Prothrombin Time: 13.4 seconds (ref 11.4–15.2)

## 2021-12-19 LAB — TSH: TSH: 11.039 u[IU]/mL — ABNORMAL HIGH (ref 0.350–4.500)

## 2021-12-19 LAB — OSMOLALITY, URINE: Osmolality, Ur: 489 mOsm/kg (ref 300–900)

## 2021-12-19 LAB — CBG MONITORING, ED: Glucose-Capillary: 241 mg/dL — ABNORMAL HIGH (ref 70–99)

## 2021-12-19 LAB — OSMOLALITY: Osmolality: 276 mOsm/kg (ref 275–295)

## 2021-12-19 LAB — POC URINE PREG, ED: Preg Test, Ur: NEGATIVE

## 2021-12-19 LAB — SODIUM, URINE, RANDOM: Sodium, Ur: 20 mmol/L

## 2021-12-19 LAB — LACTIC ACID, PLASMA
Lactic Acid, Venous: 2 mmol/L (ref 0.5–1.9)
Lactic Acid, Venous: 2.1 mmol/L (ref 0.5–1.9)
Lactic Acid, Venous: 2.4 mmol/L (ref 0.5–1.9)

## 2021-12-19 LAB — MAGNESIUM: Magnesium: 1.1 mg/dL — ABNORMAL LOW (ref 1.7–2.4)

## 2021-12-19 SURGERY — CYSTOSCOPY, WITH RETROGRADE PYELOGRAM AND URETERAL STENT INSERTION
Anesthesia: General | Laterality: Left

## 2021-12-19 MED ORDER — INSULIN ASPART 100 UNIT/ML IJ SOLN: 0.0000 [IU] | Freq: Every day | INTRAMUSCULAR | Status: DC

## 2021-12-19 MED ORDER — PROMETHAZINE HCL 25 MG/ML IJ SOLN: 6.2500 mg | INTRAMUSCULAR | Status: DC | PRN

## 2021-12-19 MED ORDER — ACETAMINOPHEN 325 MG PO TABS: 650.0000 mg | ORAL_TABLET | Freq: Four times a day (QID) | ORAL | Status: DC | PRN

## 2021-12-19 MED ORDER — POTASSIUM CHLORIDE 10 MEQ/100ML IV SOLN
10.0000 meq | INTRAVENOUS | Status: DC
  Administered 2021-12-19: 10 meq via INTRAVENOUS
  Filled 2021-12-19 (×2): qty 100

## 2021-12-19 MED ORDER — ACYCLOVIR 5 % EX OINT
TOPICAL_OINTMENT | Freq: Four times a day (QID) | CUTANEOUS | Status: DC
  Filled 2021-12-19: qty 15

## 2021-12-19 MED ORDER — LEVOTHYROXINE SODIUM 100 MCG PO TABS
200.0000 ug | ORAL_TABLET | Freq: Every day | ORAL | Status: DC
  Administered 2021-12-19: 200 ug via ORAL
  Filled 2021-12-19: qty 2

## 2021-12-19 MED ORDER — SUCCINYLCHOLINE CHLORIDE 200 MG/10ML IV SOSY
PREFILLED_SYRINGE | INTRAVENOUS | Status: DC | PRN
  Administered 2021-12-19: 150 mg via INTRAVENOUS

## 2021-12-19 MED ORDER — INSULIN ASPART 100 UNIT/ML IJ SOLN
0.0000 [IU] | Freq: Three times a day (TID) | INTRAMUSCULAR | Status: DC
  Administered 2021-12-19 (×2): 20 [IU] via SUBCUTANEOUS
  Administered 2021-12-20 (×2): 15 [IU] via SUBCUTANEOUS

## 2021-12-19 MED ORDER — INSULIN GLARGINE-YFGN 100 UNIT/ML ~~LOC~~ SOLN
15.0000 [IU] | Freq: Every day | SUBCUTANEOUS | Status: DC
  Administered 2021-12-19: 15 [IU] via SUBCUTANEOUS
  Filled 2021-12-19 (×2): qty 0.15

## 2021-12-19 MED ORDER — LEVOTHYROXINE SODIUM 100 MCG PO TABS: 200.0000 ug | ORAL_TABLET | ORAL | Status: DC

## 2021-12-19 MED ORDER — MAGNESIUM SULFATE 4 GM/100ML IV SOLN
4.0000 g | Freq: Once | INTRAVENOUS | Status: AC
  Administered 2021-12-19: 4 g via INTRAVENOUS
  Filled 2021-12-19: qty 100

## 2021-12-19 MED ORDER — MIDAZOLAM HCL 2 MG/2ML IJ SOLN
INTRAMUSCULAR | Status: AC
  Filled 2021-12-19: qty 2

## 2021-12-19 MED ORDER — LIDOCAINE VISCOUS HCL 2 % MT SOLN
15.0000 mL | Freq: Four times a day (QID) | OROMUCOSAL | Status: DC | PRN
  Administered 2021-12-19: 15 mL via OROMUCOSAL
  Filled 2021-12-19 (×2): qty 15

## 2021-12-19 MED ORDER — NALOXONE HCL 0.4 MG/ML IJ SOLN: 0.4000 mg | INTRAMUSCULAR | Status: DC | PRN

## 2021-12-19 MED ORDER — LEVOTHYROXINE SODIUM 75 MCG PO TABS
225.0000 ug | ORAL_TABLET | ORAL | Status: DC
  Administered 2021-12-20: 225 ug via ORAL
  Filled 2021-12-19: qty 3

## 2021-12-19 MED ORDER — LIDOCAINE HCL (CARDIAC) PF 100 MG/5ML IV SOSY
PREFILLED_SYRINGE | INTRAVENOUS | Status: DC | PRN
  Administered 2021-12-19: 60 mg via INTRATRACHEAL

## 2021-12-19 MED ORDER — ACETAMINOPHEN 650 MG RE SUPP: 650.0000 mg | Freq: Four times a day (QID) | RECTAL | Status: DC | PRN

## 2021-12-19 MED ORDER — OXYCODONE HCL 5 MG PO TABS: 5.0000 mg | ORAL_TABLET | Freq: Once | ORAL | Status: DC | PRN

## 2021-12-19 MED ORDER — PROPOFOL 10 MG/ML IV BOLUS
INTRAVENOUS | Status: AC
  Filled 2021-12-19: qty 20

## 2021-12-19 MED ORDER — INSULIN ASPART 100 UNIT/ML IJ SOLN
0.0000 [IU] | INTRAMUSCULAR | Status: DC
  Administered 2021-12-19: 2 [IU] via SUBCUTANEOUS

## 2021-12-19 MED ORDER — ROCURONIUM BROMIDE 100 MG/10ML IV SOLN
INTRAVENOUS | Status: DC | PRN
  Administered 2021-12-19: 20 mg via INTRAVENOUS

## 2021-12-19 MED ORDER — ONDANSETRON HCL 4 MG/2ML IJ SOLN
INTRAMUSCULAR | Status: DC | PRN
  Administered 2021-12-19: 4 mg via INTRAVENOUS

## 2021-12-19 MED ORDER — MIDAZOLAM HCL 5 MG/5ML IJ SOLN
INTRAMUSCULAR | Status: DC | PRN
  Administered 2021-12-19: 2 mg via INTRAVENOUS

## 2021-12-19 MED ORDER — OXYCODONE HCL 5 MG/5ML PO SOLN: 5.0000 mg | Freq: Once | ORAL | Status: DC | PRN

## 2021-12-19 MED ORDER — SODIUM CHLORIDE 0.9 % IR SOLN
Status: DC | PRN
  Administered 2021-12-19: 3000 mL via INTRAVESICAL

## 2021-12-19 MED ORDER — PROPOFOL 10 MG/ML IV BOLUS
INTRAVENOUS | Status: DC | PRN
  Administered 2021-12-19: 200 mg via INTRAVENOUS

## 2021-12-19 MED ORDER — ONDANSETRON HCL 4 MG/2ML IJ SOLN: 4.0000 mg | Freq: Four times a day (QID) | INTRAMUSCULAR | Status: DC | PRN

## 2021-12-19 MED ORDER — FENTANYL CITRATE (PF) 100 MCG/2ML IJ SOLN: 25.0000 ug | INTRAMUSCULAR | Status: DC | PRN

## 2021-12-19 MED ORDER — FENTANYL CITRATE (PF) 250 MCG/5ML IJ SOLN
INTRAMUSCULAR | Status: AC
  Filled 2021-12-19: qty 5

## 2021-12-19 MED ORDER — LACTATED RINGERS IV SOLN: INTRAVENOUS | Status: DC | PRN

## 2021-12-19 MED ORDER — IOHEXOL 300 MG/ML  SOLN
INTRAMUSCULAR | Status: DC | PRN
  Administered 2021-12-19: 10 mL via URETHRAL

## 2021-12-19 MED ORDER — AMISULPRIDE (ANTIEMETIC) 5 MG/2ML IV SOLN: 10.0000 mg | Freq: Once | INTRAVENOUS | Status: DC | PRN

## 2021-12-19 MED ORDER — KETOROLAC TROMETHAMINE 30 MG/ML IJ SOLN: 15.0000 mg | Freq: Once | INTRAMUSCULAR | Status: DC

## 2021-12-19 MED ORDER — SODIUM CHLORIDE 0.9 % IV BOLUS
1000.0000 mL | Freq: Once | INTRAVENOUS | Status: AC
  Administered 2021-12-19: 1000 mL via INTRAVENOUS

## 2021-12-19 MED ORDER — LACTATED RINGERS IV SOLN: INTRAVENOUS | Status: DC

## 2021-12-19 MED ORDER — INSULIN ASPART 100 UNIT/ML IJ SOLN
0.0000 [IU] | Freq: Four times a day (QID) | INTRAMUSCULAR | Status: DC
  Administered 2021-12-19: 2 [IU] via SUBCUTANEOUS

## 2021-12-19 MED ORDER — SODIUM CHLORIDE 0.9 % IV SOLN
1.0000 g | INTRAVENOUS | Status: DC
  Administered 2021-12-19: 1 g via INTRAVENOUS
  Filled 2021-12-19: qty 10

## 2021-12-19 MED ORDER — ACETAMINOPHEN 10 MG/ML IV SOLN: 1000.0000 mg | Freq: Once | INTRAVENOUS | Status: DC | PRN

## 2021-12-19 MED ORDER — SUGAMMADEX SODIUM 500 MG/5ML IV SOLN
INTRAVENOUS | Status: DC | PRN
  Administered 2021-12-19: 300 mg via INTRAVENOUS

## 2021-12-19 MED ORDER — WATER FOR IRRIGATION, STERILE IR SOLN
Status: DC | PRN
  Administered 2021-12-19: 1000 mL via SURGICAL_CAVITY

## 2021-12-19 MED ORDER — POTASSIUM CHLORIDE CRYS ER 20 MEQ PO TBCR
40.0000 meq | EXTENDED_RELEASE_TABLET | Freq: Once | ORAL | Status: AC
  Administered 2021-12-19: 40 meq via ORAL
  Filled 2021-12-19: qty 2

## 2021-12-19 MED ORDER — FENTANYL CITRATE (PF) 250 MCG/5ML IJ SOLN
INTRAMUSCULAR | Status: DC | PRN
  Administered 2021-12-19 (×2): 50 ug via INTRAVENOUS

## 2021-12-19 MED ORDER — FENTANYL CITRATE PF 50 MCG/ML IJ SOSY: 50.0000 ug | PREFILLED_SYRINGE | INTRAMUSCULAR | Status: DC | PRN

## 2021-12-19 MED ORDER — INSULIN ASPART 100 UNIT/ML IJ SOLN
6.0000 [IU] | Freq: Once | INTRAMUSCULAR | Status: AC
  Administered 2021-12-19: 6 [IU] via SUBCUTANEOUS

## 2021-12-19 SURGICAL SUPPLY — 24 items
BAG DRN RND TRDRP ANRFLXCHMBR (UROLOGICAL SUPPLIES) ×1
BAG URINE DRAIN 2000ML AR STRL (UROLOGICAL SUPPLIES) ×2 IMPLANT
BAG URO CATCHER STRL LF (MISCELLANEOUS) ×2 IMPLANT
CATH FOLEY 2WAY SLVR  5CC 16FR (CATHETERS) ×1
CATH FOLEY 2WAY SLVR 5CC 16FR (CATHETERS) IMPLANT
CATH URETL OPEN END 6FR 70 (CATHETERS) ×2 IMPLANT
GLOVE BIOGEL M 8.0 STRL (GLOVE) ×2 IMPLANT
GOWN STRL REUS W/ TWL LRG LVL3 (GOWN DISPOSABLE) ×2 IMPLANT
GOWN STRL REUS W/ TWL XL LVL3 (GOWN DISPOSABLE) ×2 IMPLANT
GOWN STRL REUS W/TWL LRG LVL3 (GOWN DISPOSABLE) ×1
GOWN STRL REUS W/TWL XL LVL3 (GOWN DISPOSABLE) ×1
GUIDEWIRE ANG ZIPWIRE 038X150 (WIRE) IMPLANT
GUIDEWIRE STR DUAL SENSOR (WIRE) ×2 IMPLANT
IV NS IRRIG 3000ML ARTHROMATIC (IV SOLUTION) IMPLANT
KIT TURNOVER KIT B (KITS) ×2 IMPLANT
MANIFOLD NEPTUNE II (INSTRUMENTS) IMPLANT
NS IRRIG 1000ML POUR BTL (IV SOLUTION) IMPLANT
PACK CYSTO (CUSTOM PROCEDURE TRAY) ×2 IMPLANT
STENT URET 6FRX24 CONTOUR (STENTS) IMPLANT
STENT URET 6FRX26 CONTOUR (STENTS) IMPLANT
SYPHON OMNI JUG (MISCELLANEOUS) ×2 IMPLANT
TOWEL GREEN STERILE FF (TOWEL DISPOSABLE) ×2 IMPLANT
TUBE CONNECTING 12X1/4 (SUCTIONS) IMPLANT
WATER STERILE IRR 3000ML UROMA (IV SOLUTION) ×2 IMPLANT

## 2021-12-19 NOTE — H&P (View-Only) (Signed)
Subjective: No diagnosis found.   Consult requested by Dr. Quintella Reichert   Joan Walker is a 46 yo female who had the onset "Sunday of fever to 103 and Nausea with vomiting.  She continued to feel poorly and went to the Urgent care yesterday and was then sent to the ER.  She has had no flank pain or voiding complaints, but her urine looks infected and a CT shows an 8mm left proximal stone with obstruction.  She has a mild leukocytosis, AKI and elevated lactate.   She continues to have chills and has had some frequency since being put on IV.  She has a history of UTI's in the past that she has self treated with cranberry pills.   She has no prior history of stones or other GU history.   ROS:  Review of Systems  Constitutional:  Positive for chills, fever and malaise/fatigue.  Gastrointestinal:  Positive for nausea.  All other systems reviewed and are negative.   Allergies  Allergen Reactions   Other Swelling    Past Medical History:  Diagnosis Date   DM2 (diabetes mellitus, type 2) (HCC)     Past Surgical History:  Procedure Laterality Date   CHOLECYSTECTOMY      Social History   Socioeconomic History   Marital status: Married    Spouse name: Not on file   Number of children: Not on file   Years of education: Not on file   Highest education level: Not on file  Occupational History   Not on file  Tobacco Use   Smoking status: Former    Types: Cigarettes    Quit date: 01/20/2004    Years since quitting: 17.9   Smokeless tobacco: Never  Substance and Sexual Activity   Alcohol use: No   Drug use: No   Sexual activity: Not Currently  Other Topics Concern   Not on file  Social History Narrative   Not on file   Social Determinants of Health   Financial Resource Strain: Not on file  Food Insecurity: Not on file  Transportation Needs: Not on file  Physical Activity: Not on file  Stress: Not on file  Social Connections: Not on file  Intimate Partner Violence: Not on  file  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   Family History  Problem Relation Age of Onset   Bladder Cancer Mother        60s   Prostate cancer Maternal Uncle    Lung cancer Paternal Aunt    Lung cancer Paternal Grandmother     Anti-infectives: Anti-infectives (From admission, onward)    Start     Dose/Rate Route Frequency Ordered Stop   12/19/21 2200  cefTRIAXone (ROCEPHIN) 1 g in sodium chloride 0.9 % 100 mL IVPB        1 g 200 mL/hr over 30 Minutes Intravenous Every 24 hours 12/19/21 0337     11" /29/23 2330  cefTRIAXone (ROCEPHIN) 1 g in sodium chloride 0.9 % 100 mL IVPB        1 g 200 mL/hr over 30 Minutes Intravenous  Once 12/18/21 2317 12/19/21 0013       Current Facility-Administered Medications  Medication Dose Route Frequency Provider Last Rate Last Admin   acetaminophen (TYLENOL) tablet 650 mg  650 mg Oral Q6H PRN Howerter, Justin B, DO       Or   acetaminophen (TYLENOL) suppository 650 mg  650 mg Rectal Q6H PRN Howerter, Justin B, DO       cefTRIAXone (ROCEPHIN)  1 g in sodium chloride 0.9 % 100 mL IVPB  1 g Intravenous Q24H Howerter, Justin B, DO       fentaNYL (SUBLIMAZE) injection 50 mcg  50 mcg Intravenous Q2H PRN Howerter, Justin B, DO       insulin aspart (novoLOG) injection 0-6 Units  0-6 Units Subcutaneous Q6H Howerter, Justin B, DO       lactated ringers infusion   Intravenous Continuous Howerter, Justin B, DO       naloxone (NARCAN) injection 0.4 mg  0.4 mg Intravenous PRN Howerter, Justin B, DO       ondansetron (ZOFRAN) injection 4 mg  4 mg Intravenous Q6H PRN Howerter, Justin B, DO       Current Outpatient Medications  Medication Sig Dispense Refill   atorvastatin (LIPITOR) 10 MG tablet Take 1 tablet (10 mg total) by mouth daily. 90 tablet 3   levothyroxine (SYNTHROID) 200 MCG tablet 1 TABLET BEFORE BREAKFAST DAILY EXCEPT HALF TABLET ON SUNDAYS 90 tablet 1   lisinopril (ZESTRIL) 30 MG tablet Take 1 tablet (30 mg total) by mouth daily. (Patient taking differently:  Take 20 mg by mouth daily.) 90 tablet 1   meloxicam (MOBIC) 15 MG tablet Take 15 mg by mouth daily.     metFORMIN (GLUCOPHAGE-XR) 500 MG 24 hr tablet Take 4 tablets (2,000 mg total) by mouth daily with supper. 120 tablet 3   Semaglutide (RYBELSUS) 14 MG TABS Take 1 tablet (14 mg total) by mouth daily. 30 tablet 2   tirzepatide (MOUNJARO) 5 MG/0.5ML Pen INJECT 5MG  INTO THE SKIN ONCE A WEEK 6 mL 0     Objective: Vital signs in last 24 hours: BP 110/62   Pulse 85   Temp 97.6 F (36.4 C) (Oral)   Resp 18   Ht 5\' 6"  (1.676 m)   Wt (!) 149.7 kg   SpO2 100%   BMI 53.26 kg/m   Intake/Output from previous day: No intake/output data recorded. Intake/Output this shift: No intake/output data recorded.   Physical Exam Vitals reviewed.  Constitutional:      Appearance: She is well-developed. She is obese.  HENT:     Head: Normocephalic and atraumatic.  Cardiovascular:     Rate and Rhythm: Normal rate and regular rhythm.     Heart sounds: Normal heart sounds.  Pulmonary:     Effort: Pulmonary effort is normal. No tachypnea or respiratory distress.     Breath sounds: Normal breath sounds.  Abdominal:     Palpations: Abdomen is soft. There is no mass.     Tenderness: There is no abdominal tenderness.  Musculoskeletal:        General: Normal range of motion.  Skin:    General: Skin is warm and dry.  Neurological:     General: No focal deficit present.     Mental Status: She is alert and oriented to person, place, and time.  Psychiatric:        Mood and Affect: Mood normal.        Behavior: Behavior normal.     Lab Results:  Results for orders placed or performed during the hospital encounter of 12/18/21 (from the past 24 hour(s))  Resp panel by RT-PCR (RSV, Flu A&B, Covid) Anterior Nasal Swab     Status: None   Collection Time: 12/18/21  9:00 PM   Specimen: Anterior Nasal Swab  Result Value Ref Range   SARS Coronavirus 2 by RT PCR NEGATIVE NEGATIVE   Influenza A by PCR  NEGATIVE NEGATIVE   Influenza B by PCR NEGATIVE NEGATIVE   Resp Syncytial Virus by PCR NEGATIVE NEGATIVE  Urinalysis, Routine w reflex microscopic Anterior Nasal Swab     Status: Abnormal   Collection Time: 12/18/21  9:01 PM  Result Value Ref Range   Color, Urine YELLOW YELLOW   APPearance CLOUDY (A) CLEAR   Specific Gravity, Urine 1.017 1.005 - 1.030   pH 5.0 5.0 - 8.0   Glucose, UA NEGATIVE NEGATIVE mg/dL   Hgb urine dipstick SMALL (A) NEGATIVE   Bilirubin Urine NEGATIVE NEGATIVE   Ketones, ur NEGATIVE NEGATIVE mg/dL   Protein, ur 100 (A) NEGATIVE mg/dL   Nitrite POSITIVE (A) NEGATIVE   Leukocytes,Ua MODERATE (A) NEGATIVE   RBC / HPF 0-5 0 - 5 RBC/hpf   WBC, UA >50 (H) 0 - 5 WBC/hpf   Bacteria, UA MANY (A) NONE SEEN   Squamous Epithelial / LPF 0-5 0 - 5   Mucus PRESENT   Basic metabolic panel     Status: Abnormal   Collection Time: 12/18/21  9:07 PM  Result Value Ref Range   Sodium 129 (L) 135 - 145 mmol/L   Potassium 3.3 (L) 3.5 - 5.1 mmol/L   Chloride 94 (L) 98 - 111 mmol/L   CO2 22 22 - 32 mmol/L   Glucose, Bld 325 (H) 70 - 99 mg/dL   BUN 13 6 - 20 mg/dL   Creatinine, Ser 1.39 (H) 0.44 - 1.00 mg/dL   Calcium 7.9 (L) 8.9 - 10.3 mg/dL   GFR, Estimated 47 (L) >60 mL/min   Anion gap 13 5 - 15  CBC with Differential     Status: Abnormal   Collection Time: 12/18/21  9:07 PM  Result Value Ref Range   WBC 11.2 (H) 4.0 - 10.5 K/uL   RBC 3.79 (L) 3.87 - 5.11 MIL/uL   Hemoglobin 11.5 (L) 12.0 - 15.0 g/dL   HCT 33.8 (L) 36.0 - 46.0 %   MCV 89.2 80.0 - 100.0 fL   MCH 30.3 26.0 - 34.0 pg   MCHC 34.0 30.0 - 36.0 g/dL   RDW 12.4 11.5 - 15.5 %   Platelets 234 150 - 400 K/uL   nRBC 0.0 0.0 - 0.2 %   Neutrophils Relative % 70 %   Neutro Abs 7.8 (H) 1.7 - 7.7 K/uL   Lymphocytes Relative 11 %   Lymphs Abs 1.3 0.7 - 4.0 K/uL   Monocytes Relative 17 %   Monocytes Absolute 1.9 (H) 0.1 - 1.0 K/uL   Eosinophils Relative 1 %   Eosinophils Absolute 0.1 0.0 - 0.5 K/uL   Basophils  Relative 0 %   Basophils Absolute 0.0 0.0 - 0.1 K/uL   Immature Granulocytes 1 %   Abs Immature Granulocytes 0.06 0.00 - 0.07 K/uL  I-Stat venous blood gas, ED     Status: Abnormal   Collection Time: 12/18/21  9:18 PM  Result Value Ref Range   pH, Ven 7.653 (HH) 7.25 - 7.43   pCO2, Ven 16.6 (LL) 44 - 60 mmHg   pO2, Ven 132 (H) 32 - 45 mmHg   Bicarbonate 18.4 (L) 20.0 - 28.0 mmol/L   TCO2 19 (L) 22 - 32 mmol/L   O2 Saturation 100 %   Acid-Base Excess 0.0 0.0 - 2.0 mmol/L   Sodium 125 (L) 135 - 145 mmol/L   Potassium 3.3 (L) 3.5 - 5.1 mmol/L   Calcium, Ion 0.70 (LL) 1.15 - 1.40 mmol/L   HCT 34.0 (L) 36.0 -  46.0 %   Hemoglobin 11.6 (L) 12.0 - 15.0 g/dL   Sample type VENOUS    Comment NOTIFIED PHYSICIAN   Hepatic function panel     Status: Abnormal   Collection Time: 12/18/21 11:35 PM  Result Value Ref Range   Total Protein 6.3 (L) 6.5 - 8.1 g/dL   Albumin 2.8 (L) 3.5 - 5.0 g/dL   AST 36 15 - 41 U/L   ALT 32 0 - 44 U/L   Alkaline Phosphatase 71 38 - 126 U/L   Total Bilirubin 0.6 0.3 - 1.2 mg/dL   Bilirubin, Direct 0.2 0.0 - 0.2 mg/dL   Indirect Bilirubin 0.4 0.3 - 0.9 mg/dL  Lactic acid, plasma     Status: Abnormal   Collection Time: 12/18/21 11:37 PM  Result Value Ref Range   Lactic Acid, Venous 2.4 (HH) 0.5 - 1.9 mmol/L  I-Stat beta hCG blood, ED     Status: Abnormal   Collection Time: 12/18/21 11:49 PM  Result Value Ref Range   I-stat hCG, quantitative 7.9 (H) <5 mIU/mL   Comment 3          POC urine preg, ED     Status: None   Collection Time: 12/19/21 12:46 AM  Result Value Ref Range   Preg Test, Ur NEGATIVE NEGATIVE  Lactic acid, plasma     Status: Abnormal   Collection Time: 12/19/21 12:55 AM  Result Value Ref Range   Lactic Acid, Venous 2.0 (HH) 0.5 - 1.9 mmol/L    BMET Recent Labs    12/18/21 2107 12/18/21 2118  NA 129* 125*  K 3.3* 3.3*  CL 94*  --   CO2 22  --   GLUCOSE 325*  --   BUN 13  --   CREATININE 1.39*  --   CALCIUM 7.9*  --    PT/INR No  results for input(s): "LABPROT", "INR" in the last 72 hours. ABG Recent Labs    12/18/21 2118  HCO3 18.4*    Studies/Results: CT Renal Stone Study  Result Date: 12/19/2021 CLINICAL DATA:  Cough, fatigue, chills and nausea. EXAM: CT ABDOMEN AND PELVIS WITHOUT CONTRAST TECHNIQUE: Multidetector CT imaging of the abdomen and pelvis was performed following the standard protocol without IV contrast. RADIATION DOSE REDUCTION: This exam was performed according to the departmental dose-optimization program which includes automated exposure control, adjustment of the mA and/or kV according to patient size and/or use of iterative reconstruction technique. COMPARISON:  None Available. FINDINGS: Lower chest: No acute abnormality. Hepatobiliary: No focal liver abnormality is seen. Status post cholecystectomy. No biliary dilatation. Pancreas: Unremarkable. No pancreatic ductal dilatation or surrounding inflammatory changes. Spleen: Normal in size without focal abnormality. Adrenals/Urinary Tract: Adrenal glands are unremarkable. Kidneys are normal in size, without focal lesions. A 3 mm nonobstructing renal calculus is seen within the lower pole of the right kidney. An 8 mm obstructing renal stone is seen within the proximal left ureter with moderate severity hydronephrosis and hydroureter. Bladder is unremarkable. Stomach/Bowel: Stomach is within normal limits. Appendix appears normal. No evidence of bowel wall thickening, distention, or inflammatory changes. Vascular/Lymphatic: No significant vascular findings are present. No enlarged abdominal or pelvic lymph nodes. Reproductive: Uterus and bilateral adnexa are unremarkable. Other: No abdominal wall hernia or abnormality. No abdominopelvic ascites. Musculoskeletal: No acute or significant osseous findings. IMPRESSION: 1. 8 mm obstructing renal stone within the proximal left ureter. 2. 3 mm nonobstructing right renal calculus. 3. Evidence of prior cholecystectomy.  Electronically Signed   By: Hoover Browns  [place] M.D.   On: 12/19/2021 02:30   DG Chest 2 View  Result Date: 12/18/2021 CLINICAL DATA:  Cough and fever EXAM: CHEST - 2 VIEW COMPARISON:  Radiographs 09/26/2011 FINDINGS: The heart size and mediastinal contours are within normal limits. Both lungs are clear. The visualized skeletal structures are unremarkable. IMPRESSION: No active cardiopulmonary disease. Electronically Signed   By: Minerva Fester M.D.   On: 12/18/2021 21:35     Assessment/Plan: 49mm Left proximal ureteral stone with sepsis.   I will take her to the OR this morning for cystoscopy with left ureteral stent insertion.  She will need ureteroscopic stone extraction at a later date.  I have reviewed the risks of bleeding, infection, ureteral injury, need for secondary procedures, thrombotic events and anesthetic complications.         No follow-ups on file.    CC: Dr. Tilden Fossa.      Bjorn Pippin 12/19/2021

## 2021-12-19 NOTE — Transfer of Care (Signed)
Immediate Anesthesia Transfer of Care Note  Patient: Joan Walker  Procedure(s) Performed: CYSTOSCOPY WITH POSSIBLE LEFT RETROGRADE PYELOGRAM/URETERAL  AND   STENT PLACEMENT (Left)  Patient Location: PACU  Anesthesia Type:General  Level of Consciousness: drowsy  Airway & Oxygen Therapy: Patient Spontanous Breathing  Post-op Assessment: Report given to RN and Post -op Vital signs reviewed and stable  Post vital signs: Reviewed and stable  Last Vitals:  Vitals Value Taken Time  BP 152/78 12/19/21 0532  Temp    Pulse 105 12/19/21 0533  Resp 25 12/19/21 0533  SpO2 93 % 12/19/21 0533  Vitals shown include unvalidated device data.  Last Pain:  Vitals:   12/19/21 0100  TempSrc: Oral  PainSc:          Complications: No notable events documented.

## 2021-12-19 NOTE — Anesthesia Preprocedure Evaluation (Addendum)
Anesthesia Evaluation  Patient identified by MRN, date of birth, ID band Patient awake    Reviewed: Allergy & Precautions, NPO status , Patient's Chart, lab work & pertinent test results  Airway Mallampati: II  TM Distance: >3 FB Neck ROM: Full    Dental no notable dental hx.    Pulmonary former smoker   Pulmonary exam normal        Cardiovascular Normal cardiovascular exam     Neuro/Psych negative neurological ROS  negative psych ROS   GI/Hepatic negative GI ROS, Neg liver ROS,,,  Endo/Other  diabetes, Oral Hypoglycemic AgentsHypothyroidism  Morbid obesityhyponatremia  Renal/GU negative Renal ROS     Musculoskeletal negative musculoskeletal ROS (+)    Abdominal   Peds  Hematology  (+) Blood dyscrasia, anemia   Anesthesia Other Findings Left ureteral stone  Reproductive/Obstetrics Hcg negative                             Anesthesia Physical Anesthesia Plan  ASA: 4 and emergent  Anesthesia Plan: General   Post-op Pain Management:    Induction: Intravenous and Rapid sequence  PONV Risk Score and Plan: 4 or greater and Ondansetron, Dexamethasone, Midazolam and Treatment may vary due to age or medical condition  Airway Management Planned: Oral ETT  Additional Equipment:   Intra-op Plan:   Post-operative Plan: Extubation in OR  Informed Consent: I have reviewed the patients History and Physical, chart, labs and discussed the procedure including the risks, benefits and alternatives for the proposed anesthesia with the patient or authorized representative who has indicated his/her understanding and acceptance.     Dental advisory given  Plan Discussed with: CRNA  Anesthesia Plan Comments:        Anesthesia Quick Evaluation

## 2021-12-19 NOTE — Anesthesia Procedure Notes (Signed)
Procedure Name: Intubation Date/Time: 12/19/2021 5:04 AM  Performed by: Claudina Lick, CRNAPre-anesthesia Checklist: Patient identified, Emergency Drugs available, Suction available and Patient being monitored Patient Re-evaluated:Patient Re-evaluated prior to induction Oxygen Delivery Method: Circle system utilized Preoxygenation: Pre-oxygenation with 100% oxygen Induction Type: IV induction, Rapid sequence and Cricoid Pressure applied Laryngoscope Size: Miller and 2 Grade View: Grade I Tube type: Oral Tube size: 7.0 mm Number of attempts: 1 Airway Equipment and Method: Stylet Placement Confirmation: ETT inserted through vocal cords under direct vision, positive ETCO2 and breath sounds checked- equal and bilateral Secured at: 22 cm Tube secured with: Tape Dental Injury: Teeth and Oropharynx as per pre-operative assessment

## 2021-12-19 NOTE — Op Note (Signed)
Procedure: Cystoscopy with insertion of left double-J stent.  Preop diagnosis: Left proximal stone with sepsis.  Postop diagnosis: Same.  Surgeon: Dr. Bjorn Pippin.  Anesthesia: General.  Specimen: None.  Drains: 6 French by 24 cm left contour double-J stent without tether.  EBL: None.  Complications: None.  Indications: The patient is a 46 year old female who presented to the emergency room with fever and history of nausea and vomiting.  She was noted to have urinary tract infection and a CT scan demonstrated an 8 mm obstructing left proximal stone.  It was felt that emergent stenting was indicated because of signs of early sepsis.  Procedure: She had been given Rocephin in the ER.  She was taken operating room where general anesthetic was induced.  She was placed in lithotomy position and fitted with PAS hose.  Her perineum and genitalia were prepped with Betadine solution she was draped in usual sterile fashion.  Cystoscopy was performed using a 21 [place] scope and 30 degree lens.  Examination revealed a normal urethra.  The bladder wall was smooth with some spotty areas of follicular cystitis.  The right ureteral orifice was normal.  The left ureteral orifice was normal.  The left ureteral orifice was cannulated with a 5 [place] open-ended catheter and passed under fluoroscopy to just below the stone.  An angled tip Glidewire was then advanced through the ureteral catheter to the kidney without difficulty and then the ureteral catheter was advanced in the kidney over the wire.  The Glidewire was removed and there was a brisk hydronephrotic drip.  A sensor wire was then passed and the open-ended catheter was removed.  A 6 French by 26 cm contour double-J stent was then advanced over the wire to the kidney under fluoroscopic guidance.  The wire was removed, a good coil in the kidney and a good coil in the bladder.  The bladder was drained and the cystoscope was removed.  She was taken down  from lithotomy position, her anesthetic was reversed and she was moved recovery in stable condition.  There were no complications.

## 2021-12-19 NOTE — H&P (Signed)
History and Physical      Joan Walker WVP:[id number] DOB: 10-Jun-1975 DOA: 12/18/2021  PCP: Pa, [place]  Patient coming from: home   I have personally briefly reviewed patient's old medical records in [place]  Chief Complaint: fever  HPI: Joan Walker is a 46 y.o. female with medical history significant for type 2 diabetes mellitus, acquired hypothyroidism, who is admitted to [facility] on 12/18/2021 with obstructing left ureteral stone associated with urinary tract infection after presenting from home to [facility] ED complaining of fever.   The patient reports 2 days of subjective fever and chills, with ensuing development of full body rigors over the last several hours.  She notes that this has been associated with new onset intermittent nausea, in the absence of any vomiting.  She notes flank discomfort over the last few days, although reports additionally that this was limited to the right side, before developing new onset left-sided flank discomfort over the last few hours.  Denies any overt dysuria or gross hematuria.  No known prior kidney stones.  No recent trauma.  In setting of recent intermittent nausea, she notes decline in oral intake over the last 48 hours.  Denies any recent shortness of breath, cough, rash, headache, neck stiffness, diarrhea.  She also denies any recent chest pain, palpitations, diaphoresis, dizziness, presyncope, or syncope.  Not on any blood thinners as an outpatient, including no aspirin.  Denies any recent orthopnea or PND, nor any recent peripheral edema.  No known history of CAD or CHF.  She confirms a history of type 2 diabetes mellitus, chart review revealing most recent hemoglobin A1c of 7.4% when checked in September 2023.      ED Course:  Vital signs in the ED were notable for the following: Afebrile; initial heart rate 111, [place] improving into the 80s following interval IV fluids; systolic blood pressures in  the low 100s to 110s; respiratory rate 17-24, oxygen saturation 96 to 100% on room air.  Labs were notable for the following: CMP notable for sodium 129, which corrects to approximately 132.4 when taking into account concomitant hyperglycemia, which is relative to most recent prior serum sodium data point of 138 on 10/14/2021, potassium 3.3, prn 22, anion gap 13, creatinine 1.39 compared to 0.6909 2523, glucose 325, liver enzymes within normal limits.  Initial lactate 2.4, with repeat trending down to 2.0.  CBC notable for white blood cell count 11,200 with 70% neutrophils.  Urinalysis associated with cloudy appearing specimen associated with greater than 50 white blood cells, many bacteria, positive nitrate, moderate leukocyte esterase, no squamous epithelial cells, 100 protein and small hemoglobin in the absence of any RBCs.  Urine culture collected prior to initiation of IV antibiotics.  COVID-19/influenza PCR negative.  Per my interpretation, EKG in ED demonstrated the following: Sinus tachycardia with heart rate 110, normal intervals, nonspecific T wave inversion in lead III, and no evidence of ST changes, including no evidence of ST elevation.  Imaging and additional notable ED work-up: Per formal radiology read, CT renal stone shows evidence of 8 mm renal stone within the proximal left ureter associated with moderate hydronephrosis and hydroureter, will also showing 3 mm nonobstructing right renal calculus, but otherwise no evidence of acute process.  I discussed the patient's case with the EDP, who discussed patient's case/imaging with on-call urology, Dr. Jeffie Pollock, Who after evaluating the patient in [facility] ED, recommends admission to [facility], noting that it is okay for the patient to stay  at [facility], with Dr.**Planning to take the patient to the OR within the next hour or so for cystoscopy with left ureteroscopy and left ureteral stent placement.  He request that the patient be kept n.p.o.   While in the  ED, the following were administered: Acetaminophen 1 g p.o. x1, Rocephin, normal saline x2 L bolus.  Subsequently, the patient was admitted for further evaluation management of obstructing left ureteral stone associated with urinary tract infection, acute kidney injury, presenting labs also notable for mild acute hyponatremia and hypokalemia.     Review of Systems: As per HPI otherwise 10 point review of systems negative.   Past Medical History:  Diagnosis Date   DM2 (diabetes mellitus, type 2) ([place])    Hypercholesteremia    Morbid obesity ([place])     Past Surgical History:  Procedure Laterality Date   CHOLECYSTECTOMY      Social History:  reports that she quit smoking about 17 years ago. Her smoking use included cigarettes. She has never used smokeless tobacco. She reports that she does not drink alcohol and does not use drugs.   Allergies  Allergen Reactions   Other Swelling    Family History  Problem Relation Age of Onset   Bladder Cancer Mother        13s   Prostate cancer Maternal Uncle    Lung cancer Paternal Aunt    Lung cancer Paternal Grandmother     Prior to Admission medications   Medication Sig Start Date End Date Taking? Authorizing Provider  atorvastatin (LIPITOR) 10 MG tablet Take 1 tablet (10 mg total) by mouth daily. 02/04/21   Elayne Snare, MD  levothyroxine (SYNTHROID) 200 MCG tablet 1 TABLET BEFORE BREAKFAST DAILY EXCEPT HALF TABLET ON SUNDAYS 08/06/21   Elayne Snare, MD  lisinopril (ZESTRIL) 30 MG tablet Take 1 tablet (30 mg total) by mouth daily. Patient taking differently: Take 20 mg by mouth daily. 02/16/19   Elayne Snare, MD  meloxicam (MOBIC) 15 MG tablet Take 15 mg by mouth daily. 07/18/20   [provider]  metFORMIN (GLUCOPHAGE-XR) 500 MG 24 hr tablet Take 4 tablets (2,000 mg total) by mouth daily with supper. 07/11/21   Elayne Snare, MD  Semaglutide (RYBELSUS) 14 MG TABS Take 1 tablet (14 mg total) by mouth daily. 12/11/21   Philemon Kingdom, MD  tirzepatide [facility]) 5 MG/0.5ML Pen INJECT 5MG INTO THE SKIN ONCE A WEEK 12/09/21   Elayne Snare, MD     Objective    Physical Exam: Vitals:   12/19/21 0100 12/19/21 0136 12/19/21 0256 12/19/21 0300  BP:  (!) 105/55 104/63 110/62  Pulse:  82 85 85  Resp:  17 (!) 24 18  Temp: 97.6 F (36.4 C)     TempSrc: Oral     SpO2:  96% 100% 100%  Weight:      Height:        General: appears to be stated age; alert, oriented; chills, rigors, noted Skin: warm, dry, no rash Head:  AT/[place]:  Oral mucosa membranes appear dry, normal dentition Neck: supple; trachea midline Heart:  RRR; did not appreciate any M/R/G Lungs: CTAB, did not appreciate any wheezes, rales, or rhonchi Abdomen: + BS; soft, ND, NT Vascular: 2+ pedal pulses b/l; 2+ radial pulses b/l Extremities: no peripheral edema, no muscle wasting Neuro: strength and sensation intact in upper and lower extremities b/l     Labs on Admission: I have personally reviewed following labs and imaging studies  CBC: Recent  Labs  Lab 12/18/21 2107 12/18/21 2118  WBC 11.2*  --   NEUTROABS 7.8*  --   HGB 11.5* 11.6*  HCT 33.8* 34.0*  MCV 89.2  --   PLT 234  --    Basic Metabolic Panel: Recent Labs  Lab 12/18/21 2107 12/18/21 2118  NA 129* 125*  K 3.3* 3.3*  CL 94*  --   CO2 22  --   GLUCOSE 325*  --   BUN 13  --   CREATININE 1.39*  --   CALCIUM 7.9*  --    GFR: Estimated Creatinine Clearance: 76.2 mL/min (A) (by C-G formula based on SCr of 1.39 mg/dL (H)). Liver Function Tests: Recent Labs  Lab 12/18/21 2335  AST 36  ALT 32  ALKPHOS 71  BILITOT 0.6  PROT 6.3*  ALBUMIN 2.8*   No results for input(s): "LIPASE", "AMYLASE" in the last 168 hours. No results for input(s): "AMMONIA" in the last 168 hours. Coagulation Profile: No results for input(s): "INR", "PROTIME" in the last 168 hours. Cardiac Enzymes: No results for input(s): "CKTOTAL", "CKMB", "CKMBINDEX", "TROPONINI" in the last 168  hours. BNP (last 3 results) No results for input(s): "PROBNP" in the last 8760 hours. HbA1C: No results for input(s): "HGBA1C" in the last 72 hours. CBG: No results for input(s): "GLUCAP" in the last 168 hours. Lipid Profile: No results for input(s): "CHOL", "HDL", "LDLCALC", "TRIG", "CHOLHDL", "LDLDIRECT" in the last 72 hours. Thyroid Function Tests: No results for input(s): "TSH", "T4TOTAL", "FREET4", "T3FREE", "THYROIDAB" in the last 72 hours. Anemia Panel: No results for input(s): "VITAMINB12", "FOLATE", "FERRITIN", "TIBC", "IRON", "RETICCTPCT" in the last 72 hours. Urine analysis:    Component Value Date/Time   COLORURINE YELLOW 12/18/2021 2101   APPEARANCEUR CLOUDY (A) 12/18/2021 2101   LABSPEC 1.017 12/18/2021 2101   PHURINE 5.0 12/18/2021 2101   GLUCOSEU NEGATIVE 12/18/2021 2101   GLUCOSEU NEGATIVE 12/31/2018 0743   HGBUR SMALL (A) 12/18/2021 2101   BILIRUBINUR NEGATIVE 12/18/2021 2101   [place] NEGATIVE 12/18/2021 2101   PROTEINUR 100 (A) 12/18/2021 2101   UROBILINOGEN 0.2 12/31/2018 0743   NITRITE POSITIVE (A) 12/18/2021 2101   LEUKOCYTESUR MODERATE (A) 12/18/2021 2101    Radiological Exams on Admission: CT Renal Stone Study  Result Date: 12/19/2021 CLINICAL DATA:  Cough, fatigue, chills and nausea. EXAM: CT ABDOMEN AND PELVIS WITHOUT CONTRAST TECHNIQUE: Multidetector CT imaging of the abdomen and pelvis was performed following the standard protocol without IV contrast. RADIATION DOSE REDUCTION: This exam was performed according to the departmental dose-optimization program which includes automated exposure control, adjustment of the mA and/or kV according to patient size and/or use of iterative reconstruction technique. COMPARISON:  None Available. FINDINGS: Lower chest: No acute abnormality. Hepatobiliary: No focal liver abnormality is seen. Status post cholecystectomy. No biliary dilatation. Pancreas: Unremarkable. No pancreatic ductal dilatation or surrounding  inflammatory changes. Spleen: Normal in size without focal abnormality. Adrenals/Urinary Tract: Adrenal glands are unremarkable. Kidneys are normal in size, without focal lesions. A 3 mm nonobstructing renal calculus is seen within the lower pole of the right kidney. An 8 mm obstructing renal stone is seen within the proximal left ureter with moderate severity hydronephrosis and hydroureter. Bladder is unremarkable. Stomach/Bowel: Stomach is within normal limits. Appendix appears normal. No evidence of bowel wall thickening, distention, or inflammatory changes. Vascular/Lymphatic: No significant vascular findings are present. No enlarged abdominal or pelvic lymph nodes. Reproductive: Uterus and bilateral adnexa are unremarkable. Other: No abdominal wall hernia or abnormality. No abdominopelvic ascites. Musculoskeletal: No acute  or significant osseous findings. IMPRESSION: 1. 8 mm obstructing renal stone within the proximal left ureter. 2. 3 mm nonobstructing right renal calculus. 3. Evidence of prior cholecystectomy. Electronically Signed   By: Virgina Norfolk M.D.   On: 12/19/2021 02:30   DG Chest 2 View  Result Date: 12/18/2021 CLINICAL DATA:  Cough and fever EXAM: CHEST - 2 VIEW COMPARISON:  Radiographs 09/26/2011 FINDINGS: The heart size and mediastinal contours are within normal limits. Both lungs are clear. The visualized skeletal structures are unremarkable. IMPRESSION: No active cardiopulmonary disease. Electronically Signed   By: Placido Sou M.D.   On: 12/18/2021 21:35      Assessment/Plan   Principal Problem:   Left ureteral stone Active Problems:   Acquired hypothyroidism   DM2 (diabetes mellitus, type 2) (HCC)   UTI (urinary tract infection)   Lactic acidosis   AKI (acute kidney injury) ([place])   Acute hyponatremia   Hypokalemia      #) Obstructing left ureteral stone: In setting of 2 days of subjective fever, chills, with new left flank discomfort associated with  intermittent nausea, with CT renal stone showing evidence of 8 mm obstructing renal stone within the proximal left ureter, noting concomitant moderate hydronephrosis and hydroureter.  Associated with urinary tract infection, as further detailed below.  Case discussed with on-call urology, who will formally consult, and is planning on taking the patient to the OR within the next hour for will left ureteral stent placement.  No prior known history of kidney stones.  Not on any blood thinners as an outpatient.  Lyn[place] Safe Score for this patient in the context of anticipated aforementioned surgery conveys a  0.04% perioperative risk for significant cardiac event. No evidence to suggest acutely decompensated heart failure or acute MI. Consequently, no absolute contraindications to proceeding with proposed surgery at this time.   Plan: Urology consulted, as above.  NPO.  Continuous lactated Ringer's.  Monitor strict I's and O's and daily weights.  Further evaluation and management of associated UTI, including continuation of Rocephin.  Prn IV fentanyl.  Prn IV Zofran.  Repeat CMP in the morning.  Check INR.  Refraining from pharmacologic DVT prophylaxis at this time.  SCDs.              #) Urinary tract infection, as a consequence of infected/obstructing left ureteral stone, the patient has been experiencing 2 days of subjective fever and chills, now with evidence of rigors, and urinalysis suggestive of UTI in the setting of significant pyuria, many bacteria, positive nitrate, moderate leukocyte esterase, in the absence of any evidence of squamous epithelial cells to suggest a contaminated specimen.  Urine culture collected prior to initiation of Rocephin, which will be continued.  Of note, in the absence of objective fever, and with mildly elevated white blood cell count of 11,200 not meeting quantitative threshold for SIRS, presentation does not meet criteria for sepsis at this time.  Of note, Rocephin  was administered prior to collection of blood cultures.  No evidence of additional underlying infectious source at this time, including COVID-19/influenza PCR negative.  In the setting of presenting obstructing left ureteral stone, source control is being pursued via urology consultation, with plan to take patient to the OR in the next hour for left ureteral stent placement, as above.  Plan: Continuous LR.  Repeat lactic acid level in the morning.  Follow-up for result of urine culture.  Continue Rocephin.  Repeat CMP/CBC in the morning.              #)  Acute Kidney Injury:  as quantified above.  Suspect multifactorial influences, including prerenal contribution from relative dehydration as result of decline in oral intake over the last 48 hours, as well as postrenal implications of presenting obstructing left ureterolithiasis, as above.  Potential pharmacologic exacerbation in the setting of outpatient lisinopril as well as Mobic, which she takes on a scheduled basis.  Urinalysis with microscopy demonstrating greater than 50 white blood cells, no red blood cells, also showing 100 protein.   Plan: monitor strict I's & O's and daily weights. Attempt to avoid nephrotoxic agents.  Hold Mobic for now as well as lisinopril.  At that repeat CMP in the morning. Check serum magnesium level. Add-on random urine sodium and random urine creatinine.  IV fluids, as above.  Urology consulted, with plan for left ureteral stent placement over the next hour or so.            #) Lactic acidosis: Mildly elevated initial lactate of 2.4, subsequently trending down to 2.0 following interval IV fluids.  Wall source criteria not met for sepsis at this time, there are multifactorial additional likely contributing factors, including dehydration, acute kidney injury.  We will continue to provide IV fluids with plan to repeat lactate in the morning.  Plan: Continuous lactated Ringer's.  Repeat lactic acid level  in the morning.  Further evaluation management of acute kidney injury, as above.  Hold home metformin for now.  Monitor strict I's and O's and daily weights.  Add on INR.            #) Acute hypoosmolar hypovolemic hyponatremia: After correction for concomitant hyperglycemia, presenting serum sodium level mildly low, as further quantified above.  Appears hypovolemic in the context of recent decline in oral intake as result of intermittent nausea over the last 48 hours in the setting of presenting obstructing left ureteral stone, as above.  Potential additional contributing factors include potential pharmacologic impact of Mobic.  Additionally, we will check TSH in the setting of a documented history of acquired hypothyroidism.  No clinical evidence to suggest acute volume overload.  Plan: Continue secondary risk, as above.  Monitor strict I's and O's and daily weights.  Add on random urine sodium, random urine osmolality.  We will also check serum osmolality to confirm suspected hypoosmolar source.  Check TSH.  Repeat CMP in the morning.  Hold home Mobic for now.          #) Hypokalemia: Presenting serum potassium level 3.3.  In the context of concomitant acute kidney injury, will provide gentle potassium supplementation leading up to cystoscopy with left ureteral stent placement to alleviate postrenal obstruction.   Plan: Potassium chloride 20 mEq IV over 2 hours x 1 dose now.  Add on serum magnesium level.  Monitor on symmetry.  Repeat CMP in the morning.               #) Type 2 Diabetes Mellitus: documented history of such. Home insulin regimen: None. Home oral hypoglycemic agents: Metformin, semaglutide.  She is also on q. weekly injections with Mounjaro. presenting blood sugar: 325, without evidence of anion gap metabolic acidosis. Most recent A1c noted to be 7.4% when checked in September 2023.   Plan: In the setting of current n.p.o. status, will pursue every 6 hour  Accu-Cheks with low-dose sliding scale insulin.  Hold home oral hypoglycemic agents during his hospitalization.           #) acquired hypothyroidism: documented h/o such, on Synthroid as  outpatient.   Plan: cont home Synthroid.  In the setting of acute hyponatremia, will also check TSH.          DVT prophylaxis: SCD's   Code Status: Full code Family Communication: none Disposition Plan: Per Rounding Team Consults called: I discussed patient's case with on-call urology , Dr. Jeffie Pollock, who will formally and is planning to take the patient to the OR for left ureteral stent placement over the next hour, as further detailed above.;  Admission status: Inpatient     I SPENT GREATER THAN 75  MINUTES IN CLINICAL CARE TIME/MEDICAL DECISION-MAKING IN COMPLETING THIS ADMISSION.      [place] DO [facility]  From [place]   12/19/2021, 3:39 AM

## 2021-12-19 NOTE — Progress Notes (Addendum)
Pt was prescibed Synthroid qday except on Sunday but she was dose was changed to qday about 3 months ago per pt. Due to her increase TSH, we will change dose to qday except MF after discussing with Dr. Benjamine Mola.   Ulyses Southward, PharmD, BCIDP, AAHIVP, CPP Infectious Disease Pharmacist 12/19/2021 11:35 AM

## 2021-12-19 NOTE — Consult Note (Signed)
Subjective: No diagnosis found.   Consult requested by Dr. Elizabeth Rees   Joan Walker is a 46 yo female who had the onset Sunday of fever to 103 and Nausea with vomiting.  She continued to feel poorly and went to the Urgent care yesterday and was then sent to the ER.  She has had no flank pain or voiding complaints, but her urine looks infected and a CT shows an 8mm left proximal stone with obstruction.  She has a mild leukocytosis, AKI and elevated lactate.   She continues to have chills and has had some frequency since being put on IV.  She has a history of UTI's in the past that she has self treated with cranberry pills.   She has no prior history of stones or other GU history.   ROS:  Review of Systems  Constitutional:  Positive for chills, fever and malaise/fatigue.  Gastrointestinal:  Positive for nausea.  All other systems reviewed and are negative.   Allergies  Allergen Reactions   Other Swelling    Past Medical History:  Diagnosis Date   DM2 (diabetes mellitus, type 2) (HCC)     Past Surgical History:  Procedure Laterality Date   CHOLECYSTECTOMY      Social History   Socioeconomic History   Marital status: Married    Spouse name: Not on file   Number of children: Not on file   Years of education: Not on file   Highest education level: Not on file  Occupational History   Not on file  Tobacco Use   Smoking status: Former    Types: Cigarettes    Quit date: 01/20/2004    Years since quitting: 17.9   Smokeless tobacco: Never  Substance and Sexual Activity   Alcohol use: No   Drug use: No   Sexual activity: Not Currently  Other Topics Concern   Not on file  Social History Narrative   Not on file   Social Determinants of Health   Financial Resource Strain: Not on file  Food Insecurity: Not on file  Transportation Needs: Not on file  Physical Activity: Not on file  Stress: Not on file  Social Connections: Not on file  Intimate Partner Violence: Not on  file  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   Family History  Problem Relation Age of Onset   Bladder Cancer Mother        60s   Prostate cancer Maternal Uncle    Lung cancer Paternal Aunt    Lung cancer Paternal Grandmother     Anti-infectives: Anti-infectives (From admission, onward)    Start     Dose/Rate Route Frequency Ordered Stop   12/19/21 2200  cefTRIAXone (ROCEPHIN) 1 g in sodium chloride 0.9 % 100 mL IVPB        1 g 200 mL/hr over 30 Minutes Intravenous Every 24 hours 12/19/21 0337     12/18/21 2330  cefTRIAXone (ROCEPHIN) 1 g in sodium chloride 0.9 % 100 mL IVPB        1 g 200 mL/hr over 30 Minutes Intravenous  Once 12/18/21 2317 12/19/21 0013       Current Facility-Administered Medications  Medication Dose Route Frequency Provider Last Rate Last Admin   acetaminophen (TYLENOL) tablet 650 mg  650 mg Oral Q6H PRN Howerter, Justin B, DO       Or   acetaminophen (TYLENOL) suppository 650 mg  650 mg Rectal Q6H PRN Howerter, Justin B, DO       cefTRIAXone (ROCEPHIN)   1 g in sodium chloride 0.9 % 100 mL IVPB  1 g Intravenous Q24H Howerter, Justin B, DO       fentaNYL (SUBLIMAZE) injection 50 mcg  50 mcg Intravenous Q2H PRN Howerter, Justin B, DO       insulin aspart (novoLOG) injection 0-6 Units  0-6 Units Subcutaneous Q6H Howerter, Justin B, DO       lactated ringers infusion   Intravenous Continuous Howerter, Justin B, DO       naloxone (NARCAN) injection 0.4 mg  0.4 mg Intravenous PRN Howerter, Justin B, DO       ondansetron (ZOFRAN) injection 4 mg  4 mg Intravenous Q6H PRN Howerter, Justin B, DO       Current Outpatient Medications  Medication Sig Dispense Refill   atorvastatin (LIPITOR) 10 MG tablet Take 1 tablet (10 mg total) by mouth daily. 90 tablet 3   levothyroxine (SYNTHROID) 200 MCG tablet 1 TABLET BEFORE BREAKFAST DAILY EXCEPT HALF TABLET ON SUNDAYS 90 tablet 1   lisinopril (ZESTRIL) 30 MG tablet Take 1 tablet (30 mg total) by mouth daily. (Patient taking differently:  Take 20 mg by mouth daily.) 90 tablet 1   meloxicam (MOBIC) 15 MG tablet Take 15 mg by mouth daily.     metFORMIN (GLUCOPHAGE-XR) 500 MG 24 hr tablet Take 4 tablets (2,000 mg total) by mouth daily with supper. 120 tablet 3   Semaglutide (RYBELSUS) 14 MG TABS Take 1 tablet (14 mg total) by mouth daily. 30 tablet 2   tirzepatide (MOUNJARO) 5 MG/0.5ML Pen INJECT 5MG INTO THE SKIN ONCE A WEEK 6 mL 0     Objective: Vital signs in last 24 hours: BP 110/62   Pulse 85   Temp 97.6 F (36.4 C) (Oral)   Resp 18   Ht 5' 6" (1.676 m)   Wt (!) 149.7 kg   SpO2 100%   BMI 53.26 kg/m   Intake/Output from previous day: No intake/output data recorded. Intake/Output this shift: No intake/output data recorded.   Physical Exam Vitals reviewed.  Constitutional:      Appearance: She is well-developed. She is obese.  HENT:     Head: Normocephalic and atraumatic.  Cardiovascular:     Rate and Rhythm: Normal rate and regular rhythm.     Heart sounds: Normal heart sounds.  Pulmonary:     Effort: Pulmonary effort is normal. No tachypnea or respiratory distress.     Breath sounds: Normal breath sounds.  Abdominal:     Palpations: Abdomen is soft. There is no mass.     Tenderness: There is no abdominal tenderness.  Musculoskeletal:        General: Normal range of motion.  Skin:    General: Skin is warm and dry.  Neurological:     General: No focal deficit present.     Mental Status: She is alert and oriented to person, place, and time.  Psychiatric:        Mood and Affect: Mood normal.        Behavior: Behavior normal.     Lab Results:  Results for orders placed or performed during the hospital encounter of 12/18/21 (from the past 24 hour(s))  Resp panel by RT-PCR (RSV, Flu A&B, Covid) Anterior Nasal Swab     Status: None   Collection Time: 12/18/21  9:00 PM   Specimen: Anterior Nasal Swab  Result Value Ref Range   SARS Coronavirus 2 by RT PCR NEGATIVE NEGATIVE   Influenza A by PCR    NEGATIVE NEGATIVE   Influenza B by PCR NEGATIVE NEGATIVE   Resp Syncytial Virus by PCR NEGATIVE NEGATIVE  Urinalysis, Routine w reflex microscopic Anterior Nasal Swab     Status: Abnormal   Collection Time: 12/18/21  9:01 PM  Result Value Ref Range   Color, Urine YELLOW YELLOW   APPearance CLOUDY (A) CLEAR   Specific Gravity, Urine 1.017 1.005 - 1.030   pH 5.0 5.0 - 8.0   Glucose, UA NEGATIVE NEGATIVE mg/dL   Hgb urine dipstick SMALL (A) NEGATIVE   Bilirubin Urine NEGATIVE NEGATIVE   Ketones, ur NEGATIVE NEGATIVE mg/dL   Protein, ur 100 (A) NEGATIVE mg/dL   Nitrite POSITIVE (A) NEGATIVE   Leukocytes,Ua MODERATE (A) NEGATIVE   RBC / HPF 0-5 0 - 5 RBC/hpf   WBC, UA >50 (H) 0 - 5 WBC/hpf   Bacteria, UA MANY (A) NONE SEEN   Squamous Epithelial / LPF 0-5 0 - 5   Mucus PRESENT   Basic metabolic panel     Status: Abnormal   Collection Time: 12/18/21  9:07 PM  Result Value Ref Range   Sodium 129 (L) 135 - 145 mmol/L   Potassium 3.3 (L) 3.5 - 5.1 mmol/L   Chloride 94 (L) 98 - 111 mmol/L   CO2 22 22 - 32 mmol/L   Glucose, Bld 325 (H) 70 - 99 mg/dL   BUN 13 6 - 20 mg/dL   Creatinine, Ser 1.39 (H) 0.44 - 1.00 mg/dL   Calcium 7.9 (L) 8.9 - 10.3 mg/dL   GFR, Estimated 47 (L) >60 mL/min   Anion gap 13 5 - 15  CBC with Differential     Status: Abnormal   Collection Time: 12/18/21  9:07 PM  Result Value Ref Range   WBC 11.2 (H) 4.0 - 10.5 K/uL   RBC 3.79 (L) 3.87 - 5.11 MIL/uL   Hemoglobin 11.5 (L) 12.0 - 15.0 g/dL   HCT 33.8 (L) 36.0 - 46.0 %   MCV 89.2 80.0 - 100.0 fL   MCH 30.3 26.0 - 34.0 pg   MCHC 34.0 30.0 - 36.0 g/dL   RDW 12.4 11.5 - 15.5 %   Platelets 234 150 - 400 K/uL   nRBC 0.0 0.0 - 0.2 %   Neutrophils Relative % 70 %   Neutro Abs 7.8 (H) 1.7 - 7.7 K/uL   Lymphocytes Relative 11 %   Lymphs Abs 1.3 0.7 - 4.0 K/uL   Monocytes Relative 17 %   Monocytes Absolute 1.9 (H) 0.1 - 1.0 K/uL   Eosinophils Relative 1 %   Eosinophils Absolute 0.1 0.0 - 0.5 K/uL   Basophils  Relative 0 %   Basophils Absolute 0.0 0.0 - 0.1 K/uL   Immature Granulocytes 1 %   Abs Immature Granulocytes 0.06 0.00 - 0.07 K/uL  I-Stat venous blood gas, ED     Status: Abnormal   Collection Time: 12/18/21  9:18 PM  Result Value Ref Range   pH, Ven 7.653 (HH) 7.25 - 7.43   pCO2, Ven 16.6 (LL) 44 - 60 mmHg   pO2, Ven 132 (H) 32 - 45 mmHg   Bicarbonate 18.4 (L) 20.0 - 28.0 mmol/L   TCO2 19 (L) 22 - 32 mmol/L   O2 Saturation 100 %   Acid-Base Excess 0.0 0.0 - 2.0 mmol/L   Sodium 125 (L) 135 - 145 mmol/L   Potassium 3.3 (L) 3.5 - 5.1 mmol/L   Calcium, Ion 0.70 (LL) 1.15 - 1.40 mmol/L   HCT 34.0 (L) 36.0 -   46.0 %   Hemoglobin 11.6 (L) 12.0 - 15.0 g/dL   Sample type VENOUS    Comment NOTIFIED PHYSICIAN   Hepatic function panel     Status: Abnormal   Collection Time: 12/18/21 11:35 PM  Result Value Ref Range   Total Protein 6.3 (L) 6.5 - 8.1 g/dL   Albumin 2.8 (L) 3.5 - 5.0 g/dL   AST 36 15 - 41 U/L   ALT 32 0 - 44 U/L   Alkaline Phosphatase 71 38 - 126 U/L   Total Bilirubin 0.6 0.3 - 1.2 mg/dL   Bilirubin, Direct 0.2 0.0 - 0.2 mg/dL   Indirect Bilirubin 0.4 0.3 - 0.9 mg/dL  Lactic acid, plasma     Status: Abnormal   Collection Time: 12/18/21 11:37 PM  Result Value Ref Range   Lactic Acid, Venous 2.4 (HH) 0.5 - 1.9 mmol/L  I-Stat beta hCG blood, ED     Status: Abnormal   Collection Time: 12/18/21 11:49 PM  Result Value Ref Range   I-stat hCG, quantitative 7.9 (H) <5 mIU/mL   Comment 3          POC urine preg, ED     Status: None   Collection Time: 12/19/21 12:46 AM  Result Value Ref Range   Preg Test, Ur NEGATIVE NEGATIVE  Lactic acid, plasma     Status: Abnormal   Collection Time: 12/19/21 12:55 AM  Result Value Ref Range   Lactic Acid, Venous 2.0 (HH) 0.5 - 1.9 mmol/L    BMET Recent Labs    12/18/21 2107 12/18/21 2118  NA 129* 125*  K 3.3* 3.3*  CL 94*  --   CO2 22  --   GLUCOSE 325*  --   BUN 13  --   CREATININE 1.39*  --   CALCIUM 7.9*  --    PT/INR No  results for input(s): "LABPROT", "INR" in the last 72 hours. ABG Recent Labs    12/18/21 2118  HCO3 18.4*    Studies/Results: CT Renal Stone Study  Result Date: 12/19/2021 CLINICAL DATA:  Cough, fatigue, chills and nausea. EXAM: CT ABDOMEN AND PELVIS WITHOUT CONTRAST TECHNIQUE: Multidetector CT imaging of the abdomen and pelvis was performed following the standard protocol without IV contrast. RADIATION DOSE REDUCTION: This exam was performed according to the departmental dose-optimization program which includes automated exposure control, adjustment of the mA and/or kV according to patient size and/or use of iterative reconstruction technique. COMPARISON:  None Available. FINDINGS: Lower chest: No acute abnormality. Hepatobiliary: No focal liver abnormality is seen. Status post cholecystectomy. No biliary dilatation. Pancreas: Unremarkable. No pancreatic ductal dilatation or surrounding inflammatory changes. Spleen: Normal in size without focal abnormality. Adrenals/Urinary Tract: Adrenal glands are unremarkable. Kidneys are normal in size, without focal lesions. A 3 mm nonobstructing renal calculus is seen within the lower pole of the right kidney. An 8 mm obstructing renal stone is seen within the proximal left ureter with moderate severity hydronephrosis and hydroureter. Bladder is unremarkable. Stomach/Bowel: Stomach is within normal limits. Appendix appears normal. No evidence of bowel wall thickening, distention, or inflammatory changes. Vascular/Lymphatic: No significant vascular findings are present. No enlarged abdominal or pelvic lymph nodes. Reproductive: Uterus and bilateral adnexa are unremarkable. Other: No abdominal wall hernia or abnormality. No abdominopelvic ascites. Musculoskeletal: No acute or significant osseous findings. IMPRESSION: 1. 8 mm obstructing renal stone within the proximal left ureter. 2. 3 mm nonobstructing right renal calculus. 3. Evidence of prior cholecystectomy.  Electronically Signed   By: Thaddeus    Houston M.D.   On: 12/19/2021 02:30   DG Chest 2 View  Result Date: 12/18/2021 CLINICAL DATA:  Cough and fever EXAM: CHEST - 2 VIEW COMPARISON:  Radiographs 09/26/2011 FINDINGS: The heart size and mediastinal contours are within normal limits. Both lungs are clear. The visualized skeletal structures are unremarkable. IMPRESSION: No active cardiopulmonary disease. Electronically Signed   By: Minerva Fester M.D.   On: 12/18/2021 21:35     Assessment/Plan: 49mm Left proximal ureteral stone with sepsis.   I will take her to the OR this morning for cystoscopy with left ureteral stent insertion.  She will need ureteroscopic stone extraction at a later date.  I have reviewed the risks of bleeding, infection, ureteral injury, need for secondary procedures, thrombotic events and anesthetic complications.         No follow-ups on file.    CC: Dr. Tilden Fossa.      Bjorn Pippin 12/19/2021

## 2021-12-19 NOTE — Progress Notes (Signed)
PROGRESS NOTE    Joan Walker  [id number] DOB: Dec 14, 1975 DOA: 12/18/2021 PCP: [facility]    Brief Narrative:   Joan Walker is a 46 y.o. female with medical history significant for type 2 diabetes mellitus, acquired hypothyroidism, who is admitted to [facility] on 12/18/2021 with obstructing left ureteral stone associated with urinary tract infection after presenting from home to [facility]: [facility] ED complaining of fever.  Found to have obstructing left ureteral stone and was take to the OR for Cystoscopy with insertion of left double-J stent   Assessment and Plan:  Obstructing left ureteral stone:   -s/p Cystoscopy with insertion of left double-J stent   Urinary tract infection, as a consequence of infected/obstructing left ureteral stone, the patient has been experiencing 2 days of subjective fever and chills, now with evidence of rigors, and urinalysis suggestive of UTI in the setting of significant pyuria, many bacteria, positive nitrate, moderate leukocyte esterase, in the absence of any evidence of squamous epithelial cells to suggest a contaminated specimen.  - Urine culture collected prior to initiation of Rocephin, which will be continued.  Acute Kidney Injury:  -IVf -recheck in AM    Lactic acidosis: Mildly elevated initial lactate of 2.4, subsequently trending down to 2.0 following interval IV fluids.        hyponatremia:   -improved -labs in AM     Hypokalemia/hypomagnesemia -replete along with magnesium     Type 2 Diabetes Mellitus:  -SSI   hypothyroidism:  -synthroid increased as TSH continues to increase    obesity Estimated body mass index is 53.26 kg/m as calculated from the following:   Height as of this encounter: 5\' 6"  (1.676 m).   Weight as of this encounter: 149.7 kg.        DVT prophylaxis: SCDs Start: 12/19/21 0336    Code Status: Full Code Family Communication:   Disposition Plan:  Level of care: Telemetry  Medical Status is: Inpatient Remains inpatient appropriate because: needs IV abx    Consultants:  urology   Subjective: sleepy  Objective: Vitals:   12/19/21 0615 12/19/21 0630 12/19/21 0656 12/19/21 0842  BP: 117/63 120/66 139/75 128/65  Pulse: 92 94 95 89  Resp: 20 (!) 23 20 17   Temp:  98.8 F (37.1 C) 98.9 F (37.2 C) 99.5 F (37.5 C)  TempSrc:   Oral Oral  SpO2: 98% 94% 95% 96%  Weight:      Height:        Intake/Output Summary (Last 24 hours) at 12/19/2021 1104 Last data filed at 12/19/2021 [id number] Gross per 24 hour  Intake 500 ml  Output 10 ml  Net 490 ml   Filed Weights   12/18/21 2052  Weight: (!) 149.7 kg    Examination:   General: Appearance:    Severely obese female in no acute distress     Lungs:     respirations unlabored  Heart:    Normal heart rate.    MS:   All extremities are intact.    Neurologic:   Awake, alert       Data Reviewed: I have personally reviewed following labs and imaging studies  CBC: Recent Labs  Lab 12/18/21 2107 12/18/21 2118 12/19/21 0400  WBC 11.2*  --  11.9*  NEUTROABS 7.8*  --  7.9*  HGB 11.5* 11.6* 11.2*  HCT 33.8* 34.0* 34.1*  MCV 89.2  --  90.7  PLT 234  --  [id number]   Basic Metabolic Panel: Recent  Labs  Lab 12/18/21 2107 12/18/21 2118 12/19/21 0400  NA 129* 125* 132*  K 3.3* 3.3* 3.3*  CL 94*  --  95*  CO2 22  --  17*  GLUCOSE 325*  --  255*  BUN 13  --  15  CREATININE 1.39*  --  1.37*  CALCIUM 7.9*  --  7.6*  MG  --   --  1.1*   GFR: Estimated Creatinine Clearance: 77.4 mL/min (A) (by C-G formula based on SCr of 1.37 mg/dL (H)). Liver Function Tests: Recent Labs  Lab 12/18/21 2335 12/19/21 0400  AST 36 33  ALT 32 34  ALKPHOS 71 71  BILITOT 0.6 0.5  PROT 6.3* 6.2*  ALBUMIN 2.8* 2.8*   No results for input(s): "LIPASE", "AMYLASE" in the last 168 hours. No results for input(s): "AMMONIA" in the last 168 hours. Coagulation Profile: Recent Labs  Lab 12/19/21 0400  INR 1.0    Cardiac Enzymes: No results for input(s): "CKTOTAL", "CKMB", "CKMBINDEX", "TROPONINI" in the last 168 hours. BNP (last 3 results) No results for input(s): "PROBNP" in the last 8760 hours. HbA1C: No results for input(s): "HGBA1C" in the last 72 hours. CBG: Recent Labs  Lab 12/19/21 0400 12/19/21 0536 12/19/21 0832  GLUCAP 241* 224* 289*   Lipid Profile: No results for input(s): "CHOL", "HDL", "LDLCALC", "TRIG", "CHOLHDL", "LDLDIRECT" in the last 72 hours. Thyroid Function Tests: Recent Labs    12/19/21 0416  TSH 11.039*   Anemia Panel: No results for input(s): "VITAMINB12", "FOLATE", "FERRITIN", "TIBC", "IRON", "RETICCTPCT" in the last 72 hours. Sepsis Labs: Recent Labs  Lab 12/18/21 2337 12/19/21 0055 12/19/21 0400  LATICACIDVEN 2.4* 2.0* 2.1*    Recent Results (from the past 240 hour(s))  Resp panel by RT-PCR (RSV, Flu A&B, Covid) Anterior Nasal Swab     Status: None   Collection Time: 12/18/21  9:00 PM   Specimen: Anterior Nasal Swab  Result Value Ref Range Status   SARS Coronavirus 2 by RT PCR NEGATIVE NEGATIVE Final    Comment: (NOTE) SARS-CoV-2 target nucleic acids are NOT DETECTED.  The SARS-CoV-2 RNA is generally detectable in upper respiratory specimens during the acute phase of infection. The lowest concentration of SARS-CoV-2 viral copies this assay can detect is 138 copies/mL. A negative result does not preclude SARS-Cov-2 infection and should not be used as the sole basis for treatment or other patient management decisions. A negative result may occur with  improper specimen collection/handling, submission of specimen other than nasopharyngeal swab, presence of viral mutation(s) within the areas targeted by this assay, and inadequate number of viral copies(<138 copies/mL). A negative result must be combined with clinical observations, patient history, and epidemiological information. The expected result is Negative.  Fact Sheet for Patients:   BloggerCourse.com  Fact Sheet for Healthcare Providers:  SeriousBroker.it  This test is no t yet approved or cleared by the [place] FDA and  has been authorized for detection and/or diagnosis of SARS-CoV-2 by FDA under an Emergency Use Authorization (EUA). This EUA will remain  in effect (meaning this test can be used) for the duration of the COVID-19 declaration under Section 564(b)(1) of the Act, 21 U.S.C.section 360bbb-3(b)(1), unless the authorization is terminated  or revoked sooner.       Influenza A by PCR NEGATIVE NEGATIVE Final   Influenza B by PCR NEGATIVE NEGATIVE Final    Comment: (NOTE) The Xpert Xpress SARS-CoV-2/FLU/RSV plus assay is intended as an aid in the diagnosis of influenza from Nasopharyngeal swab specimens  and should not be used as a sole basis for treatment. Nasal washings and aspirates are unacceptable for Xpert Xpress SARS-CoV-2/FLU/RSV testing.  Fact Sheet for Patients: EntrepreneurPulse.com.au  Fact Sheet for Healthcare Providers: IncredibleEmployment.be  This test is not yet approved or cleared by the [place] FDA and has been authorized for detection and/or diagnosis of SARS-CoV-2 by FDA under an Emergency Use Authorization (EUA). This EUA will remain in effect (meaning this test can be used) for the duration of the COVID-19 declaration under Section 564(b)(1) of the Act, 21 U.S.C. section 360bbb-3(b)(1), unless the authorization is terminated or revoked.     Resp Syncytial Virus by PCR NEGATIVE NEGATIVE Final    Comment: (NOTE) Fact Sheet for Patients: EntrepreneurPulse.com.au  Fact Sheet for Healthcare Providers: IncredibleEmployment.be  This test is not yet approved or cleared by the [place] FDA and has been authorized for detection and/or diagnosis of SARS-CoV-2 by FDA under an Emergency Use  Authorization (EUA). This EUA will remain in effect (meaning this test can be used) for the duration of the COVID-19 declaration under Section 564(b)(1) of the Act, 21 U.S.C. section 360bbb-3(b)(1), unless the authorization is terminated or revoked.  Performed at [facility], [place] [address], [place], [place] [address]          Radiology Studies: DG Cystogram  Result Date: 12/19/2021 CLINICAL DATA:  Left ureteral stone. EXAM: Cystogram COMPARISON:  CT from earlier the same day FINDINGS: A single fluoroscopic image over the left upper quadrant shows a ureteral stent with curved retention loop. Offending calculus on prior CT is not clearly seen. IMPRESSION: Single fluoroscopic image showing left ureteral stenting. Electronically Signed   By: Jorje Guild M.D.   On: 12/19/2021 05:43   DG C-Arm 1-60 Min-No Report  Result Date: 12/19/2021 Fluoroscopy was utilized by the requesting physician.  No radiographic interpretation.   CT Renal Stone Study  Result Date: 12/19/2021 CLINICAL DATA:  Cough, fatigue, chills and nausea. EXAM: CT ABDOMEN AND PELVIS WITHOUT CONTRAST TECHNIQUE: Multidetector CT imaging of the abdomen and pelvis was performed following the standard protocol without IV contrast. RADIATION DOSE REDUCTION: This exam was performed according to the departmental dose-optimization program which includes automated exposure control, adjustment of the mA and/or kV according to patient size and/or use of iterative reconstruction technique. COMPARISON:  None Available. FINDINGS: Lower chest: No acute abnormality. Hepatobiliary: No focal liver abnormality is seen. Status post cholecystectomy. No biliary dilatation. Pancreas: Unremarkable. No pancreatic ductal dilatation or surrounding inflammatory changes. Spleen: Normal in size without focal abnormality. Adrenals/Urinary Tract: Adrenal glands are unremarkable. Kidneys are normal in size, without focal lesions. A 3 mm nonobstructing  renal calculus is seen within the lower pole of the right kidney. An 8 mm obstructing renal stone is seen within the proximal left ureter with moderate severity hydronephrosis and hydroureter. Bladder is unremarkable. Stomach/Bowel: Stomach is within normal limits. Appendix appears normal. No evidence of bowel wall thickening, distention, or inflammatory changes. Vascular/Lymphatic: No significant vascular findings are present. No enlarged abdominal or pelvic lymph nodes. Reproductive: Uterus and bilateral adnexa are unremarkable. Other: No abdominal wall hernia or abnormality. No abdominopelvic ascites. Musculoskeletal: No acute or significant osseous findings. IMPRESSION: 1. 8 mm obstructing renal stone within the proximal left ureter. 2. 3 mm nonobstructing right renal calculus. 3. Evidence of prior cholecystectomy. Electronically Signed   By: Virgina Norfolk M.D.   On: 12/19/2021 02:30   DG Chest 2 View  Result Date: 12/18/2021 CLINICAL DATA:  Cough and fever  EXAM: CHEST - 2 VIEW COMPARISON:  Radiographs 09/26/2011 FINDINGS: The heart size and mediastinal contours are within normal limits. Both lungs are clear. The visualized skeletal structures are unremarkable. IMPRESSION: No active cardiopulmonary disease. Electronically Signed   By: Placido Sou M.D.   On: 12/18/2021 21:35        Scheduled Meds:  insulin aspart  0-20 Units Subcutaneous TID WC   insulin aspart  0-5 Units Subcutaneous QHS   potassium chloride  40 mEq Oral Once   Continuous Infusions:  cefTRIAXone (ROCEPHIN)  IV     lactated ringers 125 mL/hr at 12/19/21 0715   magnesium sulfate bolus IVPB       LOS: 0 days    Time spent: 45 minutes spent on chart review, discussion with nursing staff, consultants, updating family and interview/physical exam; more than 50% of that time was spent in counseling and/or coordination of care.    Geradine Girt, DO [facility] Available via Epic secure chat 7am-7pm After  these hours, please refer to coverage provider listed on [email or web address] 12/19/2021, 11:04 AM

## 2021-12-19 NOTE — Anesthesia Postprocedure Evaluation (Signed)
Anesthesia Post Note  Patient: Joan Walker  Procedure(s) Performed: CYSTOSCOPY WITH POSSIBLE LEFT RETROGRADE PYELOGRAM/URETERAL  AND   STENT PLACEMENT (Left)     Patient location during evaluation: PACU Anesthesia Type: General Level of consciousness: awake Pain management: pain level controlled Vital Signs Assessment: post-procedure vital signs reviewed and stable Respiratory status: spontaneous breathing, nonlabored ventilation and respiratory function stable Cardiovascular status: blood pressure returned to baseline and stable Postop Assessment: no apparent nausea or vomiting Anesthetic complications: no   No notable events documented.  Last Vitals:  Vitals:   12/19/21 0630 12/19/21 0656  BP: 120/66 139/75  Pulse: 94 95  Resp: (!) 23 20  Temp: 37.1 C 37.2 C  SpO2: 94% 95%    Last Pain:  Vitals:   12/19/21 0656  TempSrc: Oral  PainSc:                  Catheryn Bacon Timm Bonenberger

## 2021-12-20 ENCOUNTER — Encounter (HOSPITAL_COMMUNITY): Payer: Self-pay | Admitting: Urology

## 2021-12-20 DIAGNOSIS — N201 Calculus of ureter: Secondary | ICD-10-CM | POA: Diagnosis not present

## 2021-12-20 LAB — BASIC METABOLIC PANEL
Anion gap: 15 (ref 5–15)
BUN: 13 mg/dL (ref 6–20)
CO2: 23 mmol/L (ref 22–32)
Calcium: 7.9 mg/dL — ABNORMAL LOW (ref 8.9–10.3)
Chloride: 94 mmol/L — ABNORMAL LOW (ref 98–111)
Creatinine, Ser: 1.32 mg/dL — ABNORMAL HIGH (ref 0.44–1.00)
GFR, Estimated: 50 mL/min — ABNORMAL LOW (ref >60)
Glucose, Bld: 324 mg/dL — ABNORMAL HIGH (ref 70–99)
Potassium: 4.1 mmol/L (ref 3.5–5.1)
Sodium: 132 mmol/L — ABNORMAL LOW (ref 135–145)

## 2021-12-20 LAB — GLUCOSE, CAPILLARY
Glucose-Capillary: 331 mg/dL — ABNORMAL HIGH (ref 70–99)
Glucose-Capillary: 313 mg/dL — ABNORMAL HIGH (ref 70–99)
Glucose-Capillary: 301 mg/dL — ABNORMAL HIGH (ref 70–99)
Glucose-Capillary: 344 mg/dL — ABNORMAL HIGH (ref 70–99)

## 2021-12-20 LAB — CBC
HCT: 31.6 % — ABNORMAL LOW (ref 36.0–46.0)
Hemoglobin: 10.8 g/dL — ABNORMAL LOW (ref 12.0–15.0)
MCH: 30.2 pg (ref 26.0–34.0)
MCHC: 34.2 g/dL (ref 30.0–36.0)
MCV: 88.3 fL (ref 80.0–100.0)
Platelets: 203 10*3/uL (ref 150–400)
RBC: 3.58 MIL/uL — ABNORMAL LOW (ref 3.87–5.11)
RDW: 12.7 % (ref 11.5–15.5)
WBC: 10.3 10*3/uL (ref 4.0–10.5)
nRBC: 0 % (ref 0.0–0.2)

## 2021-12-20 LAB — MAGNESIUM: Magnesium: 2.4 mg/dL (ref 1.7–2.4)

## 2021-12-20 MED ORDER — OXYBUTYNIN CHLORIDE 5 MG PO TABS: 5.0000 mg | ORAL_TABLET | Freq: Three times a day (TID) | ORAL | Status: DC | PRN

## 2021-12-20 MED ORDER — INSULIN GLARGINE-YFGN 100 UNIT/ML ~~LOC~~ SOLN
25.0000 [IU] | Freq: Every day | SUBCUTANEOUS | Status: DC
  Filled 2021-12-20: qty 0.25

## 2021-12-20 MED ORDER — CEFADROXIL 500 MG PO CAPS
1000.0000 mg | ORAL_CAPSULE | Freq: Two times a day (BID) | ORAL | Status: DC
  Administered 2021-12-20: 1000 mg via ORAL
  Filled 2021-12-20: qty 2

## 2021-12-20 MED ORDER — CEFADROXIL 500 MG PO CAPS: 1000.0000 mg | ORAL_CAPSULE | Freq: Two times a day (BID) | ORAL | Status: DC

## 2021-12-20 MED ORDER — CEFADROXIL 500 MG PO CAPS: 1000.0000 mg | ORAL_CAPSULE | Freq: Two times a day (BID) | ORAL | 0 refills | Status: DC

## 2021-12-20 MED ORDER — OXYBUTYNIN CHLORIDE 5 MG PO TABS: 5.0000 mg | ORAL_TABLET | Freq: Three times a day (TID) | ORAL | 1 refills | Status: AC | PRN

## 2021-12-20 MED ORDER — INSULIN ASPART 100 UNIT/ML IJ SOLN
5.0000 [IU] | Freq: Three times a day (TID) | INTRAMUSCULAR | Status: DC
  Administered 2021-12-20: 5 [IU] via SUBCUTANEOUS

## 2021-12-20 MED ORDER — NITROFURANTOIN MACROCRYSTAL 100 MG PO CAPS: 100.0000 mg | ORAL_CAPSULE | Freq: Every day | ORAL | 0 refills | Status: AC

## 2021-12-20 MED ORDER — SODIUM CHLORIDE 0.9 % IV SOLN: INTRAVENOUS | Status: DC

## 2021-12-20 MED ORDER — CALCIUM CARBONATE ANTACID 500 MG PO CHEW: 1.0000 | CHEWABLE_TABLET | Freq: Two times a day (BID) | ORAL | Status: DC | PRN

## 2021-12-20 NOTE — Progress Notes (Signed)
Pt discharged home in stable condition 

## 2021-12-20 NOTE — Progress Notes (Signed)
1 Day Post-Op  Subjective: Joan Walker is doing well post op but has increased frequency, urgency and UUI with the stent.  She is afebrile and has had resolution of the leukocytosis and improvement in the AKI.  ROS:  Review of Systems  Constitutional:  Negative for chills and fever.  Gastrointestinal:  Negative for abdominal pain.  Genitourinary:  Positive for urgency. Negative for flank pain.    Anti-infectives: Anti-infectives (From admission, onward)    Start     Dose/Rate Route Frequency Ordered Stop   12/19/21 2200  cefTRIAXone (ROCEPHIN) 1 g in sodium chloride 0.9 % 100 mL IVPB        1 g 200 mL/hr over 30 Minutes Intravenous Every 24 hours 12/19/21 0337     12/18/21 2330  cefTRIAXone (ROCEPHIN) 1 g in sodium chloride 0.9 % 100 mL IVPB        1 g 200 mL/hr over 30 Minutes Intravenous  Once 12/18/21 2317 12/19/21 0013       Current Facility-Administered Medications  Medication Dose Route Frequency Provider Last Rate Last Admin   acetaminophen (TYLENOL) tablet 650 mg  650 mg Oral Q6H PRN Howerter, Justin B, DO       Or   acetaminophen (TYLENOL) suppository 650 mg  650 mg Rectal Q6H PRN Howerter, Justin B, DO       cefTRIAXone (ROCEPHIN) 1 g in sodium chloride 0.9 % 100 mL IVPB  1 g Intravenous Q24H Howerter, Justin B, DO 200 mL/hr at 12/19/21 2302 1 g at 12/19/21 2302   insulin aspart (novoLOG) injection 0-20 Units  0-20 Units Subcutaneous TID WC Marlin Canary U, DO   20 Units at 12/19/21 1841   insulin aspart (novoLOG) injection 0-5 Units  0-5 Units Subcutaneous QHS Vann, Jessica U, DO       insulin aspart (novoLOG) injection 5 Units  5 Units Subcutaneous TID WC Vann, Jessica U, DO       insulin glargine-yfgn (SEMGLEE) injection 25 Units  25 Units Subcutaneous QHS Vann, Jessica U, DO       [START ON 12/21/2021] levothyroxine (SYNTHROID) tablet 200 mcg  200 mcg Oral Once per day on Sun Tue Wed Thu Sat Pham, Minh Q, RPH-CPP       levothyroxine (SYNTHROID) tablet 225 mcg  225 mcg Oral  Once per day on Mon Fri Pham, Minh Q, RPH-CPP   225 mcg at 12/20/21 0528   lidocaine (XYLOCAINE) 2 % viscous mouth solution 15 mL  15 mL Mouth/Throat Q6H PRN Marlin Canary U, DO   15 mL at 12/19/21 2307   naloxone [facility]) injection 0.4 mg  0.4 mg Intravenous PRN Howerter, Justin B, DO       ondansetron (ZOFRAN) injection 4 mg  4 mg Intravenous Q6H PRN Howerter, Justin B, DO       oxybutynin (DITROPAN) tablet 5 mg  5 mg Oral Q8H PRN Bjorn Pippin, MD         Objective: Vital signs in last 24 hours: Temp:  [98.6 F (37 C)-99.5 F (37.5 C)] 98.6 F (37 C) (12/01 0601) Pulse Rate:  [85-93] 90 (12/01 0601) Resp:  [15-22] 20 (12/01 0601) BP: (114-148)/(55-79) 137/79 (12/01 0601) SpO2:  [90 %-96 %] 90 % (12/01 0601)  Intake/Output from previous day: 11/30 0701 - 12/01 0700 In: 100 [IV Piggyback:100] Out: 300 [Urine:300] Intake/Output this shift: No intake/output data recorded.   Physical Exam Vitals reviewed.  Constitutional:      Appearance: She is well-developed.  Neurological:  Mental Status: She is alert.     Lab Results:  Recent Labs    12/19/21 0400 12/20/21 0310  WBC 11.9* 10.3  HGB 11.2* 10.8*  HCT 34.1* 31.6*  PLT 224 203   BMET Recent Labs    12/19/21 0400 12/20/21 0310  NA 132* 132*  K 3.3* 4.1  CL 95* 94*  CO2 17* 23  GLUCOSE 255* 324*  BUN 15 13  CREATININE 1.37* 1.32*  CALCIUM 7.6* 7.9*   PT/INR Recent Labs    12/19/21 0400  LABPROT 13.4  INR 1.0   ABG Recent Labs    12/18/21 2118  HCO3 18.4*    Studies/Results: DG Cystogram  Result Date: 12/19/2021 CLINICAL DATA:  Left ureteral stone. EXAM: Cystogram COMPARISON:  CT from earlier the same day FINDINGS: A single fluoroscopic image over the left upper quadrant shows a ureteral stent with curved retention loop. Offending calculus on prior CT is not clearly seen. IMPRESSION: Single fluoroscopic image showing left ureteral stenting. Electronically Signed   By: Tiburcio Pea M.D.    On: 12/19/2021 05:43   DG C-Arm 1-60 Min-No Report  Result Date: 12/19/2021 Fluoroscopy was utilized by the requesting physician.  No radiographic interpretation.   CT Renal Stone Study  Result Date: 12/19/2021 CLINICAL DATA:  Cough, fatigue, chills and nausea. EXAM: CT ABDOMEN AND PELVIS WITHOUT CONTRAST TECHNIQUE: Multidetector CT imaging of the abdomen and pelvis was performed following the standard protocol without IV contrast. RADIATION DOSE REDUCTION: This exam was performed according to the departmental dose-optimization program which includes automated exposure control, adjustment of the mA and/or kV according to patient size and/or use of iterative reconstruction technique. COMPARISON:  None Available. FINDINGS: Lower chest: No acute abnormality. Hepatobiliary: No focal liver abnormality is seen. Status post cholecystectomy. No biliary dilatation. Pancreas: Unremarkable. No pancreatic ductal dilatation or surrounding inflammatory changes. Spleen: Normal in size without focal abnormality. Adrenals/Urinary Tract: Adrenal glands are unremarkable. Kidneys are normal in size, without focal lesions. A 3 mm nonobstructing renal calculus is seen within the lower pole of the right kidney. An 8 mm obstructing renal stone is seen within the proximal left ureter with moderate severity hydronephrosis and hydroureter. Bladder is unremarkable. Stomach/Bowel: Stomach is within normal limits. Appendix appears normal. No evidence of bowel wall thickening, distention, or inflammatory changes. Vascular/Lymphatic: No significant vascular findings are present. No enlarged abdominal or pelvic lymph nodes. Reproductive: Uterus and bilateral adnexa are unremarkable. Other: No abdominal wall hernia or abnormality. No abdominopelvic ascites. Musculoskeletal: No acute or significant osseous findings. IMPRESSION: 1. 8 mm obstructing renal stone within the proximal left ureter. 2. 3 mm nonobstructing right renal calculus. 3.  Evidence of prior cholecystectomy. Electronically Signed   By: Aram Candela M.D.   On: 12/19/2021 02:30   DG Chest 2 View  Result Date: 12/18/2021 CLINICAL DATA:  Cough and fever EXAM: CHEST - 2 VIEW COMPARISON:  Radiographs 09/26/2011 FINDINGS: The heart size and mediastinal contours are within normal limits. Both lungs are clear. The visualized skeletal structures are unremarkable. IMPRESSION: No active cardiopulmonary disease. Electronically Signed   By: Minerva Fester M.D.   On: 12/18/2021 21:35     Assessment and Plan: Left ureteral stone with sepsis.  She is improving s/p stent insertion but has marked frequency and urgency.  I will start her on oxybutynin IR 5mg  po q8hr prn and have added a script to the D/c orders.  I have reviewed the side effects.  She is anxious for discharge and could potentially  go out on a PO antibiotic, but I will defer to the med service.    I will arrange f/u for her next intervention.         LOS: 1 day    Bjorn Pippin 12/1/2023Patient ID: Joan Walker, female   DOB: 23-Nov-1975, 46 y.o.   MRN: [id number]

## 2021-12-20 NOTE — Discharge Summary (Signed)
Physician Discharge Summary  Joan Walker UUV:[id number] DOB: Apr 07, 1975 DOA: 12/18/2021  PCP: [facility]  Admit date: 12/18/2021 Discharge date: 12/20/2021  Admitted From: home Discharge disposition: home   Recommendations for Outpatient Follow-Up:   Duricef x 10 days followed  by macrodantin daily Will need close urology follow up Please follow culture to final Follow TSH and adjust synthroid dose   Discharge Diagnosis:   Principal Problem:   Left ureteral stone Active Problems:   Acquired hypothyroidism   DM2 (diabetes mellitus, type 2) (HCC)   Acute UTI   Lactic acidosis   AKI (acute kidney injury) (HCC)   Acute hyponatremia   Hypokalemia    Discharge Condition: Improved.  Diet recommendation:  Carbohydrate-modified  Wound care: None.  Code status: Full.   History of Present Illness:   Joan Walker is a 46 y.o. female with medical history significant for type 2 diabetes mellitus, acquired hypothyroidism, who is admitted to [facility] on 12/18/2021 with obstructing left ureteral stone associated with urinary tract infection after presenting from home to [facility] ED complaining of fever.    The patient reports 2 days of subjective fever and chills, with ensuing development of full body rigors over the last several hours.  She notes that this has been associated with new onset intermittent nausea, in the absence of any vomiting.  She notes flank discomfort over the last few days, although reports additionally that this was limited to the right side, before developing new onset left-sided flank discomfort over the last few hours.  Denies any overt dysuria or gross hematuria.  No known prior kidney stones.  No recent trauma.  In setting of recent intermittent nausea, she notes decline in oral intake over the last 48 hours.   Denies any recent shortness of breath, cough, rash, headache, neck stiffness, diarrhea.  She also denies any  recent chest pain, palpitations, diaphoresis, dizziness, presyncope, or syncope.  Not on any blood thinners as an outpatient, including no aspirin.  Denies any recent orthopnea or PND, nor any recent peripheral edema.  No known history of CAD or CHF.   She confirms a history of type 2 diabetes mellitus, chart review revealing most recent hemoglobin A1c of 7.4% when checked in September 2023.   Hospital Course by Problem:   Left ureteral stone with sepsis.  -She is improving s/p stent insertion but has marked frequency and urgency. Urology started her on oxybutynin IR  po q8hr prn  -PO abx x 1 0days then macrodantin -close urology follow up  Acute Kidney Injury:  -IVf -improved    Lactic acidosis:  -resolved.        hyponatremia:   -improved     Hypokalemia/hypomagnesemia -replete along with magnesium     Type 2 Diabetes Mellitus:  -resume home regimen   hypothyroidism:  -synthroid -follow TSH outpatient once no longer sick    obesity Estimated body mass index is 53.26 kg/m as calculated from the following:   Height as of this encounter:  (1.676 m).   Weight as of this encounter: 149.7 kg.   Medical Consultants:    urology  Discharge Exam:   Vitals:   12/20/21 0601 12/20/21 0738  BP: 137/79 124/74  Pulse: 90 86  Resp: 20 16  Temp: 98.6 F (37 C) 98.3 F (36.8 C)  SpO2: 90% 97%   Vitals:   12/19/21 1758 12/19/21 2138 12/20/21 0601 12/20/21 0738  BP: (!) 148/76 (!) 114/55  137/79 124/74  Pulse: 85 93 90 86  Resp: 15 (!) 22 20 16   Temp: 99.5 F (37.5 C) 99.5 F (37.5 C) 98.6 F (37 C) 98.3 F (36.8 C)  TempSrc: Oral Oral Oral Oral  SpO2: 96% 93% 90% 97%  Weight:      Height:        General exam: Appears calm and comfortable. Up working at desk   The results of significant diagnostics from this hospitalization (including imaging, microbiology, ancillary and laboratory) are listed below for reference.     Procedures and Diagnostic  Studies:   DG Cystogram  Result Date: 12/19/2021 CLINICAL DATA:  Left ureteral stone. EXAM: Cystogram COMPARISON:  CT from earlier the same day FINDINGS: A single fluoroscopic image over the left upper quadrant shows a ureteral stent with curved retention loop. Offending calculus on prior CT is not clearly seen. IMPRESSION: Single fluoroscopic image showing left ureteral stenting. Electronically Signed   By: 12/21/2021 M.D.   On: 12/19/2021 05:43   DG C-Arm 1-60 Min-No Report  Result Date: 12/19/2021 Fluoroscopy was utilized by the requesting physician.  No radiographic interpretation.   CT Renal Stone Study  Result Date: 12/19/2021 CLINICAL DATA:  Cough, fatigue, chills and nausea. EXAM: CT ABDOMEN AND PELVIS WITHOUT CONTRAST TECHNIQUE: Multidetector CT imaging of the abdomen and pelvis was performed following the standard protocol without IV contrast. RADIATION DOSE REDUCTION: This exam was performed according to the departmental dose-optimization program which includes automated exposure control, adjustment of the mA and/or kV according to patient size and/or use of iterative reconstruction technique. COMPARISON:  None Available. FINDINGS: Lower chest: No acute abnormality. Hepatobiliary: No focal liver abnormality is seen. Status post cholecystectomy. No biliary dilatation. Pancreas: Unremarkable. No pancreatic ductal dilatation or surrounding inflammatory changes. Spleen: Normal in size without focal abnormality. Adrenals/Urinary Tract: Adrenal glands are unremarkable. Kidneys are normal in size, without focal lesions. A 3 mm nonobstructing renal calculus is seen within the lower pole of the right kidney. An 8 mm obstructing renal stone is seen within the proximal left ureter with moderate severity hydronephrosis and hydroureter. Bladder is unremarkable. Stomach/Bowel: Stomach is within normal limits. Appendix appears normal. No evidence of bowel wall thickening, distention, or inflammatory  changes. Vascular/Lymphatic: No significant vascular findings are present. No enlarged abdominal or pelvic lymph nodes. Reproductive: Uterus and bilateral adnexa are unremarkable. Other: No abdominal wall hernia or abnormality. No abdominopelvic ascites. Musculoskeletal: No acute or significant osseous findings. IMPRESSION: 1. 8 mm obstructing renal stone within the proximal left ureter. 2. 3 mm nonobstructing right renal calculus. 3. Evidence of prior cholecystectomy. Electronically Signed   By: 12/21/2021 M.D.   On: 12/19/2021 02:30   DG Chest 2 View  Result Date: 12/18/2021 CLINICAL DATA:  Cough and fever EXAM: CHEST - 2 VIEW COMPARISON:  Radiographs 09/26/2011 FINDINGS: The heart size and mediastinal contours are within normal limits. Both lungs are clear. The visualized skeletal structures are unremarkable. IMPRESSION: No active cardiopulmonary disease. Electronically Signed   By: 11/26/2011 M.D.   On: 12/18/2021 21:35     Labs:   Basic Metabolic Panel: Recent Labs  Lab 12/18/21 2107 12/18/21 2118 12/19/21 0400 12/20/21 0310  NA 129* 125* 132* 132*  K 3.3* 3.3* 3.3* 4.1  CL 94*  --  95* 94*  CO2 22  --  17* 23  GLUCOSE 325*  --  255* 324*  BUN 13  --  15 13  CREATININE 1.39*  --  1.37* 1.32*  CALCIUM 7.9*  --  7.6* 7.9*  MG  --   --  1.1* 2.4   GFR Estimated Creatinine Clearance: 80.3 mL/min (A) (by C-G formula based on SCr of 1.32 mg/dL (H)). Liver Function Tests: Recent Labs  Lab 12/18/21 2335 12/19/21 0400  AST 36 33  ALT 32 34  ALKPHOS 71 71  BILITOT 0.6 0.5  PROT 6.3* 6.2*  ALBUMIN 2.8* 2.8*   No results for input(s): "LIPASE", "AMYLASE" in the last 168 hours. No results for input(s): "AMMONIA" in the last 168 hours. Coagulation profile Recent Labs  Lab 12/19/21 0400  INR 1.0    CBC: Recent Labs  Lab 12/18/21 2107 12/18/21 2118 12/19/21 0400 12/20/21 0310  WBC 11.2*  --  11.9* 10.3  NEUTROABS 7.8*  --  7.9*  --   HGB 11.5* 11.6* 11.2*  10.8*  HCT 33.8* 34.0* 34.1* 31.6*  MCV 89.2  --  90.7 88.3  PLT 234  --  224 203   Cardiac Enzymes: No results for input(s): "CKTOTAL", "CKMB", "CKMBINDEX", "TROPONINI" in the last 168 hours. BNP: Invalid input(s): "POCBNP" CBG: Recent Labs  Lab 12/19/21 2144 12/20/21 0020 12/20/21 0604 12/20/21 0742 12/20/21 1143  GLUCAP 425* 331* 313* 301* 344*   D-Dimer No results for input(s): "DDIMER" in the last 72 hours. Hgb A1c No results for input(s): "HGBA1C" in the last 72 hours. Lipid Profile No results for input(s): "CHOL", "HDL", "LDLCALC", "TRIG", "CHOLHDL", "LDLDIRECT" in the last 72 hours. Thyroid function studies Recent Labs    12/19/21 0416  TSH 11.039*   Anemia work up No results for input(s): "VITAMINB12", "FOLATE", "FERRITIN", "TIBC", "IRON", "RETICCTPCT" in the last 72 hours. Microbiology Recent Results (from the past 240 hour(s))  Resp panel by RT-PCR (RSV, Flu A&B, Covid) Anterior Nasal Swab     Status: None   Collection Time: 12/18/21  9:00 PM   Specimen: Anterior Nasal Swab  Result Value Ref Range Status   SARS Coronavirus 2 by RT PCR NEGATIVE NEGATIVE Final    Comment: (NOTE) SARS-CoV-2 target nucleic acids are NOT DETECTED.  The SARS-CoV-2 RNA is generally detectable in upper respiratory specimens during the acute phase of infection. The lowest concentration of SARS-CoV-2 viral copies this assay can detect is 138 copies/mL. A negative result does not preclude SARS-Cov-2 infection and should not be used as the sole basis for treatment or other patient management decisions. A negative result may occur with  improper specimen collection/handling, submission of specimen other than nasopharyngeal swab, presence of viral mutation(s) within the areas targeted by this assay, and inadequate number of viral copies(<138 copies/mL). A negative result must be combined with clinical observations, patient history, and epidemiological information. The expected  result is Negative.  Fact Sheet for Patients:  BloggerCourse.comhttps://www.fda.gov/media/152166/download  Fact Sheet for Healthcare Providers:  SeriousBroker.ithttps://www.fda.gov/media/152162/download  This test is no t yet approved or cleared by the [place] FDA and  has been authorized for detection and/or diagnosis of SARS-CoV-2 by FDA under an Emergency Use Authorization (EUA). This EUA will remain  in effect (meaning this test can be used) for the duration of the COVID-19 declaration under Section 564(b)(1) of the Act, 21 U.S.C.section 360bbb-3(b)(1), unless the authorization is terminated  or revoked sooner.       Influenza A by PCR NEGATIVE NEGATIVE Final   Influenza B by PCR NEGATIVE NEGATIVE Final    Comment: (NOTE) The Xpert Xpress SARS-CoV-2/FLU/RSV plus assay is intended as an aid in the diagnosis of influenza from Nasopharyngeal swab specimens and  should not be used as a sole basis for treatment. Nasal washings and aspirates are unacceptable for Xpert Xpress SARS-CoV-2/FLU/RSV testing.  Fact Sheet for Patients: BloggerCourse.com  Fact Sheet for Healthcare Providers: SeriousBroker.it  This test is not yet approved or cleared by the [place] FDA and has been authorized for detection and/or diagnosis of SARS-CoV-2 by FDA under an Emergency Use Authorization (EUA). This EUA will remain in effect (meaning this test can be used) for the duration of the COVID-19 declaration under Section 564(b)(1) of the Act, 21 U.S.C. section 360bbb-3(b)(1), unless the authorization is terminated or revoked.     Resp Syncytial Virus by PCR NEGATIVE NEGATIVE Final    Comment: (NOTE) Fact Sheet for Patients: BloggerCourse.com  Fact Sheet for Healthcare Providers: SeriousBroker.it  This test is not yet approved or cleared by the [place] FDA and has been authorized for detection and/or diagnosis of  SARS-CoV-2 by FDA under an Emergency Use Authorization (EUA). This EUA will remain in effect (meaning this test can be used) for the duration of the COVID-19 declaration under Section 564(b)(1) of the Act, 21 U.S.C. section 360bbb-3(b)(1), unless the authorization is terminated or revoked.  Performed at [facility], [address] [place], [place] [address]   Urine Culture     Status: Abnormal (Preliminary result)   Collection Time: 12/19/21 12:42 AM   Specimen: Urine, Clean Catch  Result Value Ref Range Status   Specimen Description URINE, CLEAN CATCH  Final   Special Requests NONE  Final   Culture (A)  Final    50,000 COLONIES/mL GRAM NEGATIVE RODS SUSCEPTIBILITIES TO FOLLOW Performed at [facility], [address], [place], [place] [address]    Report Status PENDING  Incomplete     Discharge Instructions:   Discharge Instructions     Diet Carb Modified   Complete by: As directed    Increase activity slowly   Complete by: As directed       Allergies as of 12/20/2021   No Known Allergies      Medication List     STOP taking these medications    ibuprofen 200 MG tablet Commonly known as: ADVIL       TAKE these medications    acetaminophen 500 MG tablet Commonly known as: TYLENOL Take 1,000 mg by mouth every 6 (six) hours as needed for fever.   albuterol 108 (90 Base) MCG/ACT inhaler Commonly known as: VENTOLIN HFA Inhale 2 puffs into the lungs as needed for wheezing or shortness of breath.   atorvastatin 10 MG tablet Commonly known as: LIPITOR Take 1 tablet (10 mg total) by mouth daily. What changed: how much to take   calcium carbonate 500 MG chewable tablet Commonly known as: TUMS - dosed in mg elemental calcium Chew 1 tablet (200 mg of elemental calcium total) by mouth 2 (two) times daily as needed for indigestion or heartburn. What changed: how much to take   CARBOXYMETHYLCELLUL-GLYCERIN OP Place 1 drop into both eyes as  needed (dry eyes).   cefadroxil 500 MG capsule Commonly known as: DURICEF Take 2 capsules (1,000 mg total) by mouth 2 (two) times daily.   fluticasone 50 MCG/ACT nasal spray Commonly known as: FLONASE Place 1 spray into both nostrils as needed for allergies.   levothyroxine 200 MCG tablet Commonly known as: SYNTHROID 1 TABLET BEFORE BREAKFAST DAILY EXCEPT HALF TABLET ON SUNDAYS What changed:  how much to take how to take this when to take this additional instructions  lisinopril 20 MG tablet Commonly known as: ZESTRIL Take 20 mg by mouth every evening.   meloxicam 15 MG tablet Commonly known as: MOBIC Take 15 mg by mouth daily as needed for pain.   metFORMIN 500 MG 24 hr tablet Commonly known as: GLUCOPHAGE-XR Take 4 tablets (2,000 mg total) by mouth daily with supper. What changed:  how much to take when to take this additional instructions   nitrofurantoin 100 MG capsule Commonly known as: MACRODANTIN Take 1 capsule (100 mg total) by mouth at bedtime for 28 days. Start this once you finish the duricef Start taking on: December 29, 2021   nystatin powder Commonly known as: MYCOSTATIN/NYSTOP Apply 1 Application topically as needed (rash/irritation).   oxybutynin 5 MG tablet Commonly known as: DITROPAN Take 1 tablet (5 mg total) by mouth every 8 (eight) hours as needed for bladder spasms.   Rybelsus 14 MG Tabs Generic drug: Semaglutide Take 1 tablet (14 mg total) by mouth daily.        Follow-up Information     Bjorn Pippin, MD Follow up.   Specialty: Urology Why: My office will contact you to arrange the next procedure to remove the stone and a preoperative visit. Contact information: [address] [place] [address] [phone]                  Time coordinating discharge: 45 min  Signed:  Joseph Art DO  [facility] 12/20/2021, 12:12 PM

## 2021-12-21 LAB — URINE CULTURE: Culture: 50000 — AB

## 2021-12-25 ENCOUNTER — Other Ambulatory Visit: Payer: Self-pay | Admitting: Urology

## 2022-01-06 NOTE — Progress Notes (Addendum)
Anesthesia Review:  PCP: Reather Littler  Cardiologist : Chest x-ray : 12/18/21- 2 view  EKG : 12/19/21  Echo : Stress test: Cardiac Cath :  Activity level:  Sleep Study/ CPAP : Fasting Blood Sugar :      / Checks Blood Sugar -- times a day:   Blood Thinner/ Instructions /Last Dose: ASA / Instructions/ Last Dose :   12/18/21- Admitted   DM- type Hgba1c-    Rybelsus

## 2022-01-06 NOTE — Patient Instructions (Addendum)
SURGICAL WAITING ROOM VISITATION Patients having surgery or a procedure may have no more than 2 support people in the waiting area - these visitors may rotate.   Children under the age of 25 must have an adult with them who is not the patient. If the patient needs to stay at the hospital during part of their recovery, the visitor guidelines for inpatient rooms apply. Pre-op nurse will coordinate an appropriate time for 1 support person to accompany patient in pre-op.  This support person may not rotate.    Please refer to the [facility] website for the visitor guidelines for Inpatients (after your surgery is over and you are in a regular room).       Your procedure is scheduled on:  01/16/22    Report to [facility] Main Entrance    Report to admitting at  0700 AM   Call this number if you have problems the morning of surgery [phone]   Do not eat food  or drink liquids :After Midnight.                           If you have questions, please contact your surgeon's office.       Oral Hygiene is also important to reduce your risk of infection.                                    Remember - BRUSH YOUR TEETH THE MORNING OF SURGERY WITH YOUR REGULAR TOOTHPASTE  DENTURES WILL BE REMOVED PRIOR TO SURGERY PLEASE DO NOT APPLY "Poly grip" OR ADHESIVES!!!   Do NOT smoke after Midnight   Take these medicines the morning of surgery with A SIP OF WATER:  inhalers as usual and bring, synthroid   DO NOT TAKE ANY ORAL DIABETIC MEDICATIONS DAY OF YOUR SURGERY  Bring CPAP mask and tubing day of surgery.                              You may not have any metal on your body including hair pins, jewelry, and body piercing             Do not wear make-up, lotions, powders, perfumes/cologne, or deodorant  Do not wear nail polish including gel and S&S, artificial/acrylic nails, or any other type of covering on natural nails including finger and toenails. If you have artificial nails,  gel coating, etc. that needs to be removed by a nail salon please have this removed prior to surgery or surgery may need to be canceled/ delayed if the surgeon/ anesthesia feels like they are unable to be safely monitored.   Do not shave  48 hours prior to surgery.               Men may shave face and neck.   Do not bring valuables to the hospital. [facility] IS NOT             RESPONSIBLE   FOR VALUABLES.   Contacts, glasses, dentures or bridgework may not be worn into surgery.   Bring small overnight bag day of surgery.   DO NOT BRING YOUR HOME MEDICATIONS TO THE HOSPITAL. PHARMACY WILL DISPENSE MEDICATIONS LISTED ON YOUR MEDICATION LIST TO YOU DURING YOUR ADMISSION IN THE HOSPITAL!    Patients discharged on the day of surgery  will not be allowed to drive home.  Someone NEEDS to stay with you for the first 24 hours after anesthesia.   Special Instructions: Bring a copy of your healthcare power of attorney and living will documents the day of surgery if you haven't scanned them before.              Please read over the following fact sheets you were given: IF YOU HAVE QUESTIONS ABOUT YOUR PRE-OP INSTRUCTIONS PLEASE CALL [phone]   If you received a COVID test during your pre-op visit  it is requested that you wear a mask when out in public, stay away from anyone that may not be feeling well and notify your surgeon if you develop symptoms. If you test positive for Covid or have been in contact with anyone that has tested positive in the last 10 days please notify you surgeon.    [facility] - Preparing for Surgery Before surgery, you can play an important role.  Because skin is not sterile, your skin needs to be as free of germs as possible.  You can reduce the number of germs on your skin by washing with CHG (chlorahexidine gluconate) soap before surgery.  CHG is an antiseptic cleaner which kills germs and bonds with the skin to continue killing germs even after washing. Please DO  NOT use if you have an allergy to CHG or antibacterial soaps.  If your skin becomes reddened/irritated stop using the CHG and inform your nurse when you arrive at Short Stay. Do not shave (including legs and underarms) for at least 48 hours prior to the first CHG shower.  You may shave your face/neck. Please follow these instructions carefully:  1.  Shower with CHG Soap the night before surgery and the  morning of Surgery.  2.  If you choose to wash your hair, wash your hair first as usual with your  normal  shampoo.  3.  After you shampoo, rinse your hair and body thoroughly to remove the  shampoo.                           4.  Use CHG as you would any other liquid soap.  You can apply chg directly  to the skin and wash                       Gently with a scrungie or clean washcloth.  5.  Apply the CHG Soap to your body ONLY FROM THE NECK DOWN.   Do not use on face/ open                           Wound or open sores. Avoid contact with eyes, ears mouth and genitals (private parts).                       Wash face,  Genitals (private parts) with your normal soap.             6.  Wash thoroughly, paying special attention to the area where your surgery  will be performed.  7.  Thoroughly rinse your body with warm water from the neck down.  8.  DO NOT shower/wash with your normal soap after using and rinsing off  the CHG Soap.                9.  Pat yourself  dry with a clean towel.            10.  Wear clean pajamas.            11.  Place clean sheets on your bed the night of your first shower and do not  sleep with pets. Day of Surgery : Do not apply any lotions/deodorants the morning of surgery.  Please wear clean clothes to the hospital/surgery center.  FAILURE TO FOLLOW THESE INSTRUCTIONS MAY RESULT IN THE CANCELLATION OF YOUR SURGERY PATIENT SIGNATURE_________________________________  NURSE  SIGNATURE__________________________________  ________________________________________________________________________

## 2022-01-07 ENCOUNTER — Encounter (HOSPITAL_COMMUNITY)
Admission: RE | Admit: 2022-01-07 | Discharge: 2022-01-07 | Disposition: A | Discharge status: Home / Self Care | Payer: BC Managed Care – PPO | Source: Ambulatory Visit | Attending: Urology | Admitting: Urology

## 2022-01-07 ENCOUNTER — Encounter (HOSPITAL_COMMUNITY): Payer: Self-pay

## 2022-01-07 ENCOUNTER — Other Ambulatory Visit: Payer: Self-pay

## 2022-01-07 VITALS — BP 135/70 | HR 71 | Temp 98.1°F | Resp 16 | Ht 66.0 in | Wt 322.0 lb

## 2022-01-07 DIAGNOSIS — Z01812 Encounter for preprocedural laboratory examination: Secondary | ICD-10-CM | POA: Diagnosis not present

## 2022-01-07 DIAGNOSIS — Z01818 Encounter for other preprocedural examination: Secondary | ICD-10-CM

## 2022-01-07 DIAGNOSIS — E119 Type 2 diabetes mellitus without complications: Secondary | ICD-10-CM | POA: Diagnosis not present

## 2022-01-07 HISTORY — DX: Personal history of urinary calculi: Z87.442

## 2022-01-07 HISTORY — DX: Unspecified osteoarthritis, unspecified site: M19.90

## 2022-01-07 HISTORY — DX: Unspecified asthma, uncomplicated: J45.909

## 2022-01-07 HISTORY — DX: Gastro-esophageal reflux disease without esophagitis: K21.9

## 2022-01-07 HISTORY — DX: Hypothyroidism, unspecified: E03.9

## 2022-01-07 LAB — CBC
HCT: 37.8 % (ref 36.0–46.0)
Hemoglobin: 11.9 g/dL — ABNORMAL LOW (ref 12.0–15.0)
MCH: 29.8 pg (ref 26.0–34.0)
MCHC: 31.5 g/dL (ref 30.0–36.0)
MCV: 94.7 fL (ref 80.0–100.0)
Platelets: 413 10*3/uL — ABNORMAL HIGH (ref 150–400)
RBC: 3.99 MIL/uL (ref 3.87–5.11)
RDW: 13 % (ref 11.5–15.5)
WBC: 12 10*3/uL — ABNORMAL HIGH (ref 4.0–10.5)
nRBC: 0 % (ref 0.0–0.2)

## 2022-01-07 LAB — BASIC METABOLIC PANEL
Anion gap: 11 (ref 5–15)
BUN: 16 mg/dL (ref 6–20)
CO2: 25 mmol/L (ref 22–32)
Calcium: 9.5 mg/dL (ref 8.9–10.3)
Chloride: 102 mmol/L (ref 98–111)
Creatinine, Ser: 0.81 mg/dL (ref 0.44–1.00)
GFR, Estimated: >60 mL/min (ref >60)
Glucose, Bld: 133 mg/dL — ABNORMAL HIGH (ref 70–99)
Potassium: 4.2 mmol/L (ref 3.5–5.1)
Sodium: 138 mmol/L (ref 135–145)

## 2022-01-07 LAB — GLUCOSE, CAPILLARY: Glucose-Capillary: 135 mg/dL — ABNORMAL HIGH (ref 70–99)

## 2022-01-08 LAB — HEMOGLOBIN A1C
Hgb A1c MFr Bld: 8.5 % — ABNORMAL HIGH (ref 4.8–5.6)
Mean Plasma Glucose: 197 mg/dL

## 2022-01-09 LAB — URINE CULTURE: Culture: NO GROWTH

## 2022-01-10 ENCOUNTER — Other Ambulatory Visit (INDEPENDENT_AMBULATORY_CARE_PROVIDER_SITE_OTHER): Payer: BC Managed Care – PPO

## 2022-01-10 DIAGNOSIS — E039 Hypothyroidism, unspecified: Secondary | ICD-10-CM

## 2022-01-10 DIAGNOSIS — E1165 Type 2 diabetes mellitus with hyperglycemia: Secondary | ICD-10-CM | POA: Diagnosis not present

## 2022-01-10 DIAGNOSIS — E782 Mixed hyperlipidemia: Secondary | ICD-10-CM | POA: Diagnosis not present

## 2022-01-10 LAB — COMPREHENSIVE METABOLIC PANEL
ALT: 24 U/L (ref 0–35)
AST: 27 U/L (ref 0–37)
Albumin: 4.1 g/dL (ref 3.5–5.2)
Alkaline Phosphatase: 70 U/L (ref 39–117)
BUN: 16 mg/dL (ref 6–23)
CO2: 27 mEq/L (ref 19–32)
Calcium: 9.4 mg/dL (ref 8.4–10.5)
Chloride: 103 mEq/L (ref 96–112)
Creatinine, Ser: 0.77 mg/dL (ref 0.40–1.20)
GFR: 92.18 mL/min (ref >60.00)
Glucose, Bld: 161 mg/dL — ABNORMAL HIGH (ref 70–99)
Potassium: 4.5 mEq/L (ref 3.5–5.1)
Sodium: 139 mEq/L (ref 135–145)
Total Bilirubin: 0.3 mg/dL (ref 0.2–1.2)
Total Protein: 7.2 g/dL (ref 6.0–8.3)

## 2022-01-10 LAB — LIPID PANEL
Cholesterol: 180 mg/dL (ref 0–200)
HDL: 36.6 mg/dL — ABNORMAL LOW (ref >39.00)
NonHDL: 142.91
Total CHOL/HDL Ratio: 5
Triglycerides: 226 mg/dL — ABNORMAL HIGH (ref 0.0–149.0)
VLDL: 45.2 mg/dL — ABNORMAL HIGH (ref 0.0–40.0)

## 2022-01-10 LAB — LDL CHOLESTEROL, DIRECT: Direct LDL: 113 mg/dL

## 2022-01-10 LAB — HEMOGLOBIN A1C: Hgb A1c MFr Bld: 8.6 % — ABNORMAL HIGH (ref 4.6–6.5)

## 2022-01-11 LAB — TSH: TSH: 0.73 u[IU]/mL (ref 0.35–5.50)

## 2022-01-12 DIAGNOSIS — L02212 Cutaneous abscess of back [any part, except buttock]: Secondary | ICD-10-CM | POA: Diagnosis not present

## 2022-01-12 DIAGNOSIS — L723 Sebaceous cyst: Secondary | ICD-10-CM | POA: Diagnosis not present

## 2022-01-16 ENCOUNTER — Encounter (HOSPITAL_COMMUNITY): Payer: Self-pay | Admitting: Urology

## 2022-01-16 ENCOUNTER — Ambulatory Visit (HOSPITAL_COMMUNITY): Payer: BC Managed Care – PPO | Admitting: Physician Assistant

## 2022-01-16 ENCOUNTER — Encounter (HOSPITAL_COMMUNITY)
Admission: RE | Disposition: A | Discharge status: Home / Self Care | Payer: Self-pay | Source: Home / Self Care | Attending: Urology

## 2022-01-16 ENCOUNTER — Ambulatory Visit (HOSPITAL_COMMUNITY)
Admission: RE | Admit: 2022-01-16 | Discharge: 2022-01-16 | Disposition: A | Discharge status: Home / Self Care | Payer: BC Managed Care – PPO | Attending: Urology | Admitting: Urology

## 2022-01-16 ENCOUNTER — Ambulatory Visit (HOSPITAL_COMMUNITY): Payer: BC Managed Care – PPO | Admitting: Certified Registered Nurse Anesthetist

## 2022-01-16 ENCOUNTER — Ambulatory Visit (HOSPITAL_COMMUNITY): Payer: BC Managed Care – PPO

## 2022-01-16 DIAGNOSIS — E119 Type 2 diabetes mellitus without complications: Secondary | ICD-10-CM | POA: Insufficient documentation

## 2022-01-16 DIAGNOSIS — Z7985 Long-term (current) use of injectable non-insulin antidiabetic drugs: Secondary | ICD-10-CM | POA: Diagnosis not present

## 2022-01-16 DIAGNOSIS — Z6841 Body Mass Index (BMI) 40.0 and over, adult: Secondary | ICD-10-CM | POA: Diagnosis not present

## 2022-01-16 DIAGNOSIS — J45909 Unspecified asthma, uncomplicated: Secondary | ICD-10-CM | POA: Diagnosis not present

## 2022-01-16 DIAGNOSIS — Z87891 Personal history of nicotine dependence: Secondary | ICD-10-CM | POA: Insufficient documentation

## 2022-01-16 DIAGNOSIS — N201 Calculus of ureter: Secondary | ICD-10-CM | POA: Diagnosis not present

## 2022-01-16 DIAGNOSIS — M1990 Unspecified osteoarthritis, unspecified site: Secondary | ICD-10-CM | POA: Diagnosis not present

## 2022-01-16 DIAGNOSIS — E785 Hyperlipidemia, unspecified: Secondary | ICD-10-CM | POA: Diagnosis not present

## 2022-01-16 DIAGNOSIS — Z7984 Long term (current) use of oral hypoglycemic drugs: Secondary | ICD-10-CM | POA: Insufficient documentation

## 2022-01-16 DIAGNOSIS — Z01818 Encounter for other preprocedural examination: Secondary | ICD-10-CM

## 2022-01-16 HISTORY — PX: CYSTOSCOPY/URETEROSCOPY/HOLMIUM LASER/STENT PLACEMENT: SHX6546

## 2022-01-16 LAB — GLUCOSE, CAPILLARY: Glucose-Capillary: 159 mg/dL — ABNORMAL HIGH (ref 70–99)

## 2022-01-16 LAB — POCT PREGNANCY, URINE: Preg Test, Ur: NEGATIVE

## 2022-01-16 SURGERY — CYSTOSCOPY/URETEROSCOPY/HOLMIUM LASER/STENT PLACEMENT
Anesthesia: General | Laterality: Left

## 2022-01-16 MED ORDER — SODIUM CHLORIDE 0.9 % IV SOLN
2.0000 g | INTRAVENOUS | Status: AC
  Administered 2022-01-16: 2 g via INTRAVENOUS
  Filled 2022-01-16: qty 20

## 2022-01-16 MED ORDER — SODIUM CHLORIDE 0.9% FLUSH: 3.0000 mL | Freq: Two times a day (BID) | INTRAVENOUS | Status: DC

## 2022-01-16 MED ORDER — HYDROMORPHONE HCL 1 MG/ML IJ SOLN: 0.2500 mg | INTRAMUSCULAR | Status: DC | PRN

## 2022-01-16 MED ORDER — OXYCODONE-ACETAMINOPHEN 5-325 MG PO TABS: 1.0000 | ORAL_TABLET | Freq: Four times a day (QID) | ORAL | 0 refills | Status: AC | PRN

## 2022-01-16 MED ORDER — ACETAMINOPHEN 10 MG/ML IV SOLN
INTRAVENOUS | Status: DC | PRN
  Administered 2022-01-16: 1000 mg via INTRAVENOUS

## 2022-01-16 MED ORDER — DEXAMETHASONE SODIUM PHOSPHATE 10 MG/ML IJ SOLN
INTRAMUSCULAR | Status: DC | PRN
  Administered 2022-01-16: 4 mg via INTRAVENOUS

## 2022-01-16 MED ORDER — LIDOCAINE 2% (20 MG/ML) 5 ML SYRINGE
INTRAMUSCULAR | Status: DC | PRN
  Administered 2022-01-16: 60 mg via INTRAVENOUS

## 2022-01-16 MED ORDER — ACETAMINOPHEN 10 MG/ML IV SOLN
INTRAVENOUS | Status: AC
  Filled 2022-01-16: qty 100

## 2022-01-16 MED ORDER — SODIUM CHLORIDE 0.9 % IR SOLN
Status: DC | PRN
  Administered 2022-01-16: 6000 mL

## 2022-01-16 MED ORDER — CHLORHEXIDINE GLUCONATE 0.12 % MT SOLN
15.0000 mL | Freq: Once | OROMUCOSAL | Status: AC
  Administered 2022-01-16: 15 mL via OROMUCOSAL

## 2022-01-16 MED ORDER — MIDAZOLAM HCL 2 MG/2ML IJ SOLN
INTRAMUSCULAR | Status: AC
  Filled 2022-01-16: qty 2

## 2022-01-16 MED ORDER — LACTATED RINGERS IV SOLN: INTRAVENOUS | Status: DC

## 2022-01-16 MED ORDER — ORAL CARE MOUTH RINSE: 15.0000 mL | Freq: Once | OROMUCOSAL | Status: AC

## 2022-01-16 MED ORDER — SUGAMMADEX SODIUM 500 MG/5ML IV SOLN
INTRAVENOUS | Status: DC | PRN
  Administered 2022-01-16: 300 mg via INTRAVENOUS

## 2022-01-16 MED ORDER — OXYCODONE HCL 5 MG PO TABS: 5.0000 mg | ORAL_TABLET | Freq: Once | ORAL | Status: DC | PRN

## 2022-01-16 MED ORDER — ROCURONIUM BROMIDE 10 MG/ML (PF) SYRINGE
PREFILLED_SYRINGE | INTRAVENOUS | Status: DC | PRN
  Administered 2022-01-16: 40 mg via INTRAVENOUS

## 2022-01-16 MED ORDER — MIDAZOLAM HCL 5 MG/5ML IJ SOLN
INTRAMUSCULAR | Status: DC | PRN
  Administered 2022-01-16: 2 mg via INTRAVENOUS

## 2022-01-16 MED ORDER — ONDANSETRON HCL 4 MG/2ML IJ SOLN
INTRAMUSCULAR | Status: DC | PRN
  Administered 2022-01-16: 4 mg via INTRAVENOUS

## 2022-01-16 MED ORDER — AMISULPRIDE (ANTIEMETIC) 5 MG/2ML IV SOLN: 10.0000 mg | Freq: Once | INTRAVENOUS | Status: DC | PRN

## 2022-01-16 MED ORDER — SUGAMMADEX SODIUM 500 MG/5ML IV SOLN
INTRAVENOUS | Status: AC
  Filled 2022-01-16: qty 5

## 2022-01-16 MED ORDER — OXYCODONE HCL 5 MG/5ML PO SOLN: 5.0000 mg | Freq: Once | ORAL | Status: DC | PRN

## 2022-01-16 MED ORDER — FENTANYL CITRATE (PF) 100 MCG/2ML IJ SOLN
INTRAMUSCULAR | Status: DC | PRN
  Administered 2022-01-16 (×2): 50 ug via INTRAVENOUS

## 2022-01-16 MED ORDER — PROPOFOL 500 MG/50ML IV EMUL
INTRAVENOUS | Status: AC
  Filled 2022-01-16: qty 50

## 2022-01-16 MED ORDER — MEPERIDINE HCL 50 MG/ML IJ SOLN: 6.2500 mg | INTRAMUSCULAR | Status: DC | PRN

## 2022-01-16 MED ORDER — PROPOFOL 10 MG/ML IV BOLUS
INTRAVENOUS | Status: DC | PRN
  Administered 2022-01-16: 200 mg via INTRAVENOUS

## 2022-01-16 MED ORDER — PROPOFOL 10 MG/ML IV BOLUS
INTRAVENOUS | Status: AC
  Filled 2022-01-16: qty 20

## 2022-01-16 MED ORDER — SUCCINYLCHOLINE CHLORIDE 200 MG/10ML IV SOSY
PREFILLED_SYRINGE | INTRAVENOUS | Status: DC | PRN
  Administered 2022-01-16: 140 mg via INTRAVENOUS

## 2022-01-16 MED ORDER — PROMETHAZINE HCL 25 MG/ML IJ SOLN: 6.2500 mg | INTRAMUSCULAR | Status: DC | PRN

## 2022-01-16 MED ORDER — FENTANYL CITRATE (PF) 100 MCG/2ML IJ SOLN
INTRAMUSCULAR | Status: AC
  Filled 2022-01-16: qty 2

## 2022-01-16 SURGICAL SUPPLY — 22 items
BAG URO CATCHER STRL LF (MISCELLANEOUS) ×2 IMPLANT
BASKET STONE NCOMPASS (UROLOGICAL SUPPLIES) IMPLANT
CATH URETERAL DUAL LUMEN 10F (MISCELLANEOUS) IMPLANT
CATH URETL OPEN 5X70 (CATHETERS) IMPLANT
CLOTH BEACON ORANGE TIMEOUT ST (SAFETY) ×2 IMPLANT
EXTRACTOR STONE NITINOL NGAGE (UROLOGICAL SUPPLIES) IMPLANT
GLOVE SURG SS PI 8.0 STRL IVOR (GLOVE) ×2 IMPLANT
GOWN STRL REUS W/ TWL XL LVL3 (GOWN DISPOSABLE) ×2 IMPLANT
GOWN STRL REUS W/TWL XL LVL3 (GOWN DISPOSABLE) ×1
GUIDEWIRE STR DUAL SENSOR (WIRE) ×2 IMPLANT
IV NS IRRIG 3000ML ARTHROMATIC (IV SOLUTION) ×2 IMPLANT
KIT TURNOVER KIT A (KITS) IMPLANT
LASER FIB FLEXIVA PULSE ID 365 (Laser) IMPLANT
LASER FIB FLEXIVA PULSE ID 550 (Laser) IMPLANT
LASER FIB FLEXIVA PULSE ID 910 (Laser) IMPLANT
MANIFOLD NEPTUNE II (INSTRUMENTS) ×2 IMPLANT
PACK CYSTO (CUSTOM PROCEDURE TRAY) ×2 IMPLANT
SHEATH NAVIGATOR HD 11/13X36 (SHEATH) IMPLANT
TRACTIP FLEXIVA PULS ID 200XHI (Laser) IMPLANT
TRACTIP FLEXIVA PULSE ID 200 (Laser)
TUBING CONNECTING 10 (TUBING) ×2 IMPLANT
TUBING UROLOGY SET (TUBING) ×2 IMPLANT

## 2022-01-16 NOTE — Interval H&P Note (Signed)
History and Physical Interval Note:  01/16/2022 8:59 AM  Joan Walker  has presented today for surgery, with the diagnosis of LEFT PROXIMAL STONE.  The various methods of treatment have been discussed with the patient and family. After consideration of risks, benefits and other options for treatment, the patient has consented to  Procedure(s): CYSTOSCOPY LEF TURETEROSCOPY/HOLMIUM LASER/STENT PLACEMENT (Left) as a surgical intervention.  The patient's history has been reviewed, patient examined, no change in status, stable for surgery.  I have reviewed the patient's chart and labs.  Questions were answered to the patient's satisfaction.     Bjorn Pippin

## 2022-01-16 NOTE — Discharge Instructions (Addendum)
Please bring your stone fragments to the office.   You may remove the stent by pulling the attached string on Tuesday morning.  If you don't feel comfortable doing that, we can remove at your f/u.

## 2022-01-16 NOTE — Transfer of Care (Signed)
Immediate Anesthesia Transfer of Care Note  Patient: Joan Walker  Procedure(s) Performed: CYSTOSCOPY LEF TURETEROSCOPY/HOLMIUM LASER/STENT PLACEMENT (Left)  Patient Location: PACU  Anesthesia Type:General  Level of Consciousness: drowsy  Airway & Oxygen Therapy: Patient Spontanous Breathing and Patient connected to face mask oxygen  Post-op Assessment: Report given to RN and Post -op Vital signs reviewed and stable  Post vital signs: Reviewed and stable  Last Vitals:  Vitals Value Taken Time  BP 142/65 01/16/22 1038  Temp 36.6 C 01/16/22 1038  Pulse 90 01/16/22 1040  Resp 16 01/16/22 1040  SpO2 100 % 01/16/22 1040  Vitals shown include unvalidated device data.  Last Pain:  Vitals:   01/16/22 0724  TempSrc:   PainSc: 0-No pain         Complications: No notable events documented.

## 2022-01-16 NOTE — Anesthesia Procedure Notes (Signed)
Procedure Name: Intubation Date/Time: 01/16/2022 9:21 AM  Performed by: West Pugh, CRNAPre-anesthesia Checklist: Patient identified, Emergency Drugs available, Suction available, Patient being monitored and Timeout performed Patient Re-evaluated:Patient Re-evaluated prior to induction Oxygen Delivery Method: Circle system utilized Preoxygenation: Pre-oxygenation with 100% oxygen Induction Type: IV induction and Rapid sequence Laryngoscope Size: Mac and 4 Grade View: Grade I Tube type: Oral Tube size: 7.0 mm Number of attempts: 1 Airway Equipment and Method: Stylet Placement Confirmation: ETT inserted through vocal cords under direct vision, positive ETCO2, CO2 detector and breath sounds checked- equal and bilateral Secured at: 22 cm Tube secured with: Tape Dental Injury: Teeth and Oropharynx as per pre-operative assessment

## 2022-01-16 NOTE — Op Note (Signed)
Procedure: 1.  Cystoscopy with left ureteroscopy with holmium laser application, stone extraction and exchange of left double-J stent. 2.  Application of fluoroscopy.  Preop diagnosis: Left proximal ureteral stone.  Postop diagnosis: Same.  Surgeon: Dr. Bjorn Pippin.  Anesthesia: General.  Specimen: Stone fragments.  Drains: 6 French by 24 cm left contour double-J stent with tether.  EBL: None.  Complications: None.  Indications: The patient is a 46 year old female who initially presented with sepsis and 8 mm left proximal stone.  She underwent stenting and returns now for definitive stone management.  Procedure: She was taken the operating room where she was given 2 g of Rocephin.  A general anesthetic was induced.  She was placed in the lithotomy position and fitted with PAS hose.  Her perineum and genitalia were prepped with Betadine solution and she was draped in usual sterile fashion.  The 21 French cystoscope was passed with the 30 degree lens and the stent loose was visualized on the left.  There was significant encrustation of the stent loop with meatal edema.  The remainder of the bladder was unremarkable as was the right ureteral orifice.  The stent loop was grasped with grasping forceps and pulled to the urethral meatus.  The encrusted end was cut off with scissors and a sensor wire was passed but it would not go beyond the proximal tip so the stent was removed.  The cystoscope was reinserted and a sensor wire was passed to the kidney with the aid of a 5 [place] open-ended catheter.  I was able to get about the stone without difficulty.  An 11/13 French 36 cm digital access sheath was then advanced over the wire to just below the stone without difficulty and the inner core and wire were removed.  The dual-lumen digital flexible ureteroscope was then passed to the level of the stone and a 200 m laser fiber was then advanced and the stone was fragmented with fragments being removed  with an engage basket as the procedure progressed.  The laser was set on the dusting setting with the right pedal on 1 J and 10 Hz and the left pedal on 0.2 J and 70 Hz.  The right pedal was primarily used.  After all fragments had been removed from the ureter, the scope was advanced into the kidney and the calyces were inspected.  There was a large fragment that had moved into a midpole calyx which was then fragmented with the laser with the fragments being removed until all that remained were 1 to 2 mm smaller fragments that were too small for the basket.  Once visual and fluoroscopic inspection demonstrated no significant residual fragments in the ureteroscope was removed and the wire was replaced to the kidney.  She had a fair amount of edema at the site of the impacted stone with some bleeding from the fragile mucosa so it was felt restenting was indicated.  The cystoscope was reinserted over the wire and a 6 [place] by 24 cm contour double-J stent with tether was advanced the kidney under fluoroscopic guidance.  The wire was removed leaving a good coil in the kidney and a good coil in the bladder.  The cystoscope was removed after the bladder was drained and the stent string was left exiting the urethra.  It was knotted close to the urethral meatus and trimmed to an appropriate length before being taught vaginally and tampon string fashion.  She was taken down from lithotomy position, her anesthetic was reversed  and she was moved to recovery in stable condition.  There were no complications.  She will be sent home with her stone fragments to bring to the office for analysis.

## 2022-01-16 NOTE — Anesthesia Preprocedure Evaluation (Addendum)
Anesthesia Evaluation  Patient identified by MRN, date of birth, ID band Patient awake    Reviewed: Allergy & Precautions, NPO status , Patient's Chart, lab work & pertinent test results  Airway Mallampati: II  TM Distance: >3 FB Neck ROM: Full    Dental no notable dental hx.    Pulmonary asthma , former smoker   Pulmonary exam normal        Cardiovascular Normal cardiovascular exam     Neuro/Psych negative neurological ROS  negative psych ROS   GI/Hepatic Neg liver ROS,GERD  ,,  Endo/Other  diabetes, Oral Hypoglycemic AgentsHypothyroidism  Morbid obesityhyponatremia  Renal/GU Renal disease     Musculoskeletal  (+) Arthritis , Osteoarthritis,    Abdominal  (+) + obese  Peds  Hematology  (+) Blood dyscrasia, anemia   Anesthesia Other Findings Left ureteral stone  Reproductive/Obstetrics Hcg negative                             Anesthesia Physical Anesthesia Plan  ASA: 3  Anesthesia Plan: General   Post-op Pain Management: Dilaudid IV and Ofirmev IV (intra-op)*   Induction: Intravenous  PONV Risk Score and Plan: 3 and Ondansetron, Dexamethasone, Midazolam and Treatment may vary due to age or medical condition  Airway Management Planned: LMA  Additional Equipment:   Intra-op Plan:   Post-operative Plan: Extubation in OR  Informed Consent: I have reviewed the patients History and Physical, chart, labs and discussed the procedure including the risks, benefits and alternatives for the proposed anesthesia with the patient or authorized representative who has indicated his/her understanding and acceptance.     Dental advisory given  Plan Discussed with: CRNA  Anesthesia Plan Comments:        Anesthesia Quick Evaluation

## 2022-01-16 NOTE — Anesthesia Postprocedure Evaluation (Signed)
Anesthesia Post Note  Patient: Joan Walker  Procedure(s) Performed: CYSTOSCOPY LEF TURETEROSCOPY/HOLMIUM LASER/STENT PLACEMENT (Left)     Patient location during evaluation: PACU Anesthesia Type: General Level of consciousness: awake and alert and oriented Pain management: pain level controlled Vital Signs Assessment: post-procedure vital signs reviewed and stable Respiratory status: spontaneous breathing, nonlabored ventilation and respiratory function stable Cardiovascular status: blood pressure returned to baseline and stable Postop Assessment: no apparent nausea or vomiting Anesthetic complications: no   No notable events documented.  Last Vitals:  Vitals:   01/16/22 1115 01/16/22 1117  BP: (!) 145/113 132/89  Pulse: 84 84  Resp:  20  Temp:  36.6 C  SpO2: 98% 96%    Last Pain:  Vitals:   01/16/22 1117  TempSrc: Oral  PainSc: 0-No pain                 Jazariah Teall A.

## 2022-01-17 ENCOUNTER — Other Ambulatory Visit: Payer: Self-pay | Admitting: Endocrinology

## 2022-01-17 ENCOUNTER — Encounter (HOSPITAL_COMMUNITY): Payer: Self-pay | Admitting: Urology

## 2022-01-17 DIAGNOSIS — E782 Mixed hyperlipidemia: Secondary | ICD-10-CM

## 2022-01-21 ENCOUNTER — Encounter: Payer: Self-pay | Admitting: Endocrinology

## 2022-01-21 ENCOUNTER — Ambulatory Visit (INDEPENDENT_AMBULATORY_CARE_PROVIDER_SITE_OTHER): Payer: BC Managed Care – PPO | Admitting: Endocrinology

## 2022-01-21 VITALS — BP 118/76 | HR 90 | Ht 66.0 in | Wt 320.8 lb

## 2022-01-21 DIAGNOSIS — E039 Hypothyroidism, unspecified: Secondary | ICD-10-CM | POA: Diagnosis not present

## 2022-01-21 DIAGNOSIS — E782 Mixed hyperlipidemia: Secondary | ICD-10-CM

## 2022-01-21 DIAGNOSIS — E1165 Type 2 diabetes mellitus with hyperglycemia: Secondary | ICD-10-CM | POA: Diagnosis not present

## 2022-01-21 LAB — POCT GLUCOSE (DEVICE FOR HOME USE): Glucose Fasting, POC: 228 mg/dL — AB (ref 70–99)

## 2022-01-21 MED ORDER — TIRZEPATIDE 7.5 MG/0.5ML ~~LOC~~ SOAJ: 7.5000 mg | SUBCUTANEOUS | 3 refills | Status: DC

## 2022-01-21 MED ORDER — TIRZEPATIDE 5 MG/0.5ML ~~LOC~~ SOAJ: 5.0000 mg | SUBCUTANEOUS | 0 refills | Status: DC

## 2022-01-21 NOTE — Patient Instructions (Signed)
Exercise daily 

## 2022-01-21 NOTE — Progress Notes (Signed)
Patient ID: Joan Walker, female   DOB: 1975/02/12, 47 y.o.   MRN: [id number]           Referring Physician: Lupe Carney  Reason for Appointment: Endocrinology follow-up    History of Present Illness:   Problem 1: Hypothyroidism was first diagnosed in ?  2015  At the time of diagnosis patient was getting a routine lab test for her annual exam but not having any symptoms of  fatigue, cold sensitivity, difficulty concentrating, dry skin, weight gain or hair loss.  Details of her prior treatments are not available except results pending 2017 when her thyroid supplement was that 150 mcg daily Subsequently her doses had been progressively increased and apparently in 2018 she was taking 200 mcg daily but no labs from 2018 are available Other labs indicate persistently high TSH levels since 2017  RECENT history:  TSH on her initial consultation 5/19 was 7.7 when she was taking 250 mcg daily At that time she was complaining of feeling tired periodically Initially dose was increased to 300 mcg levothyroxine which improved her energy level  However subsequently has needed relatively lower doses  She had been taking LEVOTHYROXINE 200 mcg, once daily The dose was increased by 1 pill weekly last time Does not think she has any change in how she feels since the dosage change  She is taking the levothyroxine supplement before breakfast regularly  every day  Her TSH which was higher than usual at 9.5, is back to normal as of 12/23          Patient's weight history is as follows:  Wt Readings from Last 3 Encounters:  01/21/22 (!) 320 lb 12.8 oz (145.5 kg)  01/07/22 (!) 322 lb (146.1 kg)  12/18/21 (!) 330 lb (149.7 kg)    Thyroid function results have been as follows:  Lab Results  Component Value Date   TSH 0.73 01/10/2022   TSH 11.039 (H) 12/19/2021   TSH 9.49 (H) 10/14/2021   TSH 4.31 07/10/2021   FREET4 0.86 10/14/2021   FREET4 1.22 07/10/2021   FREET4 1.01 08/02/2020    FREET4 0.77 05/07/2020   T3FREE 4.0 07/22/2017   T3FREE 4.1 06/02/2017   Date   free T4   TSH   T3   04/02/2015   11.38   06/12/2015  6.02    08/10/2015    9.56    10/22/2015     4.99   04/23/2017   5.76    DIABETES: Type 2 diabetes  Diagnosis date:  07/2018  History:     She had gone to her PCP for annual exam in 7/20 and was found to have a glucose of 257 and baseline A1c of 10.4 She was sent to nutritionist, started on monitoring with a One Touch meter and also prescribed metformin 1 g twice daily  Non-insulin hypoglycemic drugs: Metformin 0.5 g a.m., 1 g at dinner,  Rybelsus 14 mg in a.m.           Side effects from medications:  Mild diarrhea from Metformin  Current self management, blood sugar patterns and problems identified:  Her A1c is again up to 8.6   7.4 compared to 6.4 and previously stable below 7   She has not checked her blood sugars and she does not like fingersticks  She was tried on Mounjaro but apparently she could not get the 5 mg dose after 1 month Also not clear if her blood sugars were better with this  She  is also not consistent with diet with sometimes eating sweets, eating on balanced meals or high fat lunches at times  She thinks her blood sugars were higher when she was being treated for kidney stone Today after breakfast blood sugar is over 200 but last month 161 fasting Weight is down slightly As before does not have any time or energy for exercise   Glucometer: One Touch.           Blood Glucose readings not available           Dietician visit: Most recent:   10/07/2018   Weight history:  Wt Readings from Last 3 Encounters:  01/21/22 (!) 320 lb 12.8 oz (145.5 kg)  01/07/22 (!) 322 lb (146.1 kg)  12/18/21 (!) 330 lb (149.7 kg)   Lab Results  Component Value Date   HGBA1C 8.6 (H) 01/10/2022   HGBA1C 8.5 (H) 01/07/2022   HGBA1C 7.4 (H) 10/14/2021   Lab Results  Component Value Date   MICROALBUR 1.5 10/14/2021   LDLCALC 104 (H)  10/14/2021   CREATININE 0.77 01/10/2022     Past Medical History:  Diagnosis Date   Arthritis    Asthma    DM2 (diabetes mellitus, type 2) (HCC)    GERD (gastroesophageal reflux disease)    History of kidney stones    Hypercholesteremia    Hypothyroidism    Morbid obesity ([place])     Past Surgical History:  Procedure Laterality Date   CHOLECYSTECTOMY     CYSTOSCOPY W/ URETERAL STENT PLACEMENT Left 12/19/2021   Procedure: CYSTOSCOPY WITH POSSIBLE LEFT RETROGRADE PYELOGRAM/URETERAL  AND   STENT PLACEMENT;  Surgeon: Irine Seal, MD;  Location: [facility];  Service: Urology;  Laterality: Left;   CYSTOSCOPY/URETEROSCOPY/HOLMIUM LASER/STENT PLACEMENT Left 01/16/2022   Procedure: CYSTOSCOPY LEF TURETEROSCOPY/HOLMIUM LASER/STENT PLACEMENT;  Surgeon: Irine Seal, MD;  Location: [facility];  Service: Urology;  Laterality: Left;   WISDOM TOOTH EXTRACTION      Family History  Problem Relation Age of Onset   Bladder Cancer Mother        57s   Prostate cancer Maternal Uncle    Lung cancer Paternal Aunt    Lung cancer Paternal Grandmother     Social History:  reports that she quit smoking about 18 years ago. Her smoking use included cigarettes. She has never used smokeless tobacco. She reports that she does not drink alcohol and does not use drugs.  Allergies:  Allergies  Allergen Reactions   Bee Pollen Swelling    Allergies as of 01/21/2022       Reactions   Bee Pollen Swelling        Medication List        Accurate as of January 21, 2022 10:03 AM. If you have any questions, ask your nurse or doctor.          acetaminophen 500 MG tablet Commonly known as: TYLENOL Take 1,000 mg by mouth every 6 (six) hours as needed for fever or moderate pain.   albuterol 108 (90 Base) MCG/ACT inhaler Commonly known as: VENTOLIN HFA Inhale 2 puffs into the lungs as needed for wheezing or shortness of breath.   atorvastatin 10 MG tablet Commonly known as: LIPITOR TAKE 1 TABLET DAILY    calcium carbonate 500 MG chewable tablet Commonly known as: TUMS - dosed in mg elemental calcium Chew 1 tablet (200 mg of elemental calcium total) by mouth 2 (two) times daily as needed for indigestion or heartburn.   CARBOXYMETHYLCELLUL-GLYCERIN OP Place  1 drop into both eyes as needed (dry eyes).   fluticasone 50 MCG/ACT nasal spray Commonly known as: FLONASE Place 1 spray into both nostrils as needed for allergies.   levothyroxine 200 MCG tablet Commonly known as: SYNTHROID 1 TABLET BEFORE BREAKFAST DAILY EXCEPT HALF TABLET ON SUNDAYS What changed:  how much to take how to take this when to take this additional instructions   lisinopril 20 MG tablet Commonly known as: ZESTRIL Take 20 mg by mouth every evening.   magnesium oxide 400 (240 Mg) MG tablet Commonly known as: MAG-OX Take 400 mg by mouth at bedtime.   metFORMIN 500 MG 24 hr tablet Commonly known as: GLUCOPHAGE-XR Take 4 tablets (2,000 mg total) by mouth daily with supper. What changed:  how much to take when to take this additional instructions   nitrofurantoin 100 MG capsule Commonly known as: MACRODANTIN Take 1 capsule (100 mg total) by mouth at bedtime for 28 days. Start this once you finish the duricef   nystatin powder Commonly known as: MYCOSTATIN/NYSTOP Apply 1 Application topically as needed (rash/irritation).   oxybutynin 5 MG tablet Commonly known as: DITROPAN Take 1 tablet (5 mg total) by mouth every 8 (eight) hours as needed for bladder spasms.   oxyCODONE-acetaminophen 5-325 MG tablet Commonly known as: Percocet Take 1 tablet by mouth every 6 (six) hours as needed for severe pain.   Rybelsus 14 MG Tabs Generic drug: Semaglutide Take 1 tablet (14 mg total) by mouth daily.           Review of Systems   LIPIDS: Treated with atorvastatin 10 mg LDL 145 at baseline  LDL is still not well controlled  Triglycerides are currently over 200  Lab Results  Component Value Date    CHOL 180 01/10/2022   CHOL 171 10/14/2021   CHOL 137 08/02/2020   Lab Results  Component Value Date   HDL 36.60 (L) 01/10/2022   HDL 40.20 10/14/2021   HDL 36.50 (L) 08/02/2020   Lab Results  Component Value Date   LDLCALC 104 (H) 10/14/2021   LDLCALC 70 08/02/2020   LDLCALC 83 09/23/2019   Lab Results  Component Value Date   TRIG 226.0 (H) 01/10/2022   TRIG 134.0 10/14/2021   TRIG 149.0 08/02/2020   Lab Results  Component Value Date   CHOLHDL 5 01/10/2022   CHOLHDL 4 10/14/2021   CHOLHDL 4 08/02/2020   Lab Results  Component Value Date   LDLDIRECT 113.0 01/10/2022     Treated with lisinopril 30 mg for history of hypertension This is followed by her PCP   BP Readings from Last 3 Encounters:  01/21/22 118/76  01/16/22 132/89  01/07/22 135/70      Examination:    BP 118/76 (BP Location: Left Arm, Patient Position: Sitting, Cuff Size: Normal)   Pulse 90   Ht 5\' 6"  (1.676 m)   Wt (!) 320 lb 12.8 oz (145.5 kg)   SpO2 99%   BMI 51.78 kg/m   No ankle edema  Assessment:  HYPOTHYROIDISM, diagnosed in 2014 also and with no typical symptoms at baseline  Her levothyroxine dose is 200 mcg daily and TSH in therapeutic range   DIABETES with morbid obesity:  She is on Metformin 1500 mg a day and Rybelsus 14 mg daily Blood sugars are now not well-controlled and not clear if she was having better readings with Mounjaro which she could not refill  LIPIDS: Not well controlled and still high likely from poor diet Likely needs  to be on a higher dose of Lipitor    PLAN:   LEVOTHYROXINE 200 mcg DAILY as before  She will try Mounjaro again starting 5 mg She will let us know if she has any difficulties but after 1 month can try to go to 7.5 mg Encouraged her to start watching her diet better with balanced low-fat meals Also encouraged her to start a walking program although unlikely she will do this Also unlikely she will monitor her blood sugars at  home  Atorvastatin to be increased to 20 mg daily and she can use the current supply at home  Follow-up in 3 months  There are no Patient Instructions on file for this visit.     Reather Littler 01/21/2022, 10:03 AM    Note: This office note was prepared with Dragon voice recognition system technology. Any transcriptional errors that result from this process are unintentional.

## 2022-01-23 DIAGNOSIS — N201 Calculus of ureter: Secondary | ICD-10-CM | POA: Diagnosis not present

## 2022-02-10 ENCOUNTER — Other Ambulatory Visit: Payer: Self-pay | Admitting: Family Medicine

## 2022-02-10 ENCOUNTER — Ambulatory Visit
Admission: RE | Admit: 2022-02-10 | Discharge: 2022-02-10 | Disposition: A | Discharge status: Home / Self Care | Payer: BC Managed Care – PPO | Source: Ambulatory Visit | Attending: Family Medicine | Admitting: Family Medicine

## 2022-02-10 DIAGNOSIS — M50322 Other cervical disc degeneration at C5-C6 level: Secondary | ICD-10-CM | POA: Diagnosis not present

## 2022-02-10 DIAGNOSIS — M19012 Primary osteoarthritis, left shoulder: Secondary | ICD-10-CM | POA: Diagnosis not present

## 2022-02-10 DIAGNOSIS — I10 Essential (primary) hypertension: Secondary | ICD-10-CM | POA: Diagnosis not present

## 2022-02-10 DIAGNOSIS — M25512 Pain in left shoulder: Secondary | ICD-10-CM

## 2022-02-10 DIAGNOSIS — M2578 Osteophyte, vertebrae: Secondary | ICD-10-CM | POA: Diagnosis not present

## 2022-02-10 DIAGNOSIS — E1165 Type 2 diabetes mellitus with hyperglycemia: Secondary | ICD-10-CM | POA: Diagnosis not present

## 2022-02-10 DIAGNOSIS — E039 Hypothyroidism, unspecified: Secondary | ICD-10-CM | POA: Diagnosis not present

## 2022-02-26 ENCOUNTER — Other Ambulatory Visit: Payer: Self-pay | Admitting: Family Medicine

## 2022-02-26 DIAGNOSIS — M25512 Pain in left shoulder: Secondary | ICD-10-CM

## 2022-02-26 DIAGNOSIS — M5030 Other cervical disc degeneration, unspecified cervical region: Secondary | ICD-10-CM

## 2022-02-27 ENCOUNTER — Other Ambulatory Visit: Payer: Self-pay | Admitting: Endocrinology

## 2022-02-27 ENCOUNTER — Telehealth: Payer: Self-pay

## 2022-02-27 DIAGNOSIS — M898X1 Other specified disorders of bone, shoulder: Secondary | ICD-10-CM | POA: Diagnosis not present

## 2022-02-27 DIAGNOSIS — M79602 Pain in left arm: Secondary | ICD-10-CM | POA: Diagnosis not present

## 2022-02-27 DIAGNOSIS — E1165 Type 2 diabetes mellitus with hyperglycemia: Secondary | ICD-10-CM

## 2022-02-27 NOTE — Telephone Encounter (Signed)
Pt requesting refill of Mounjaro 5 mg, will she need to increase to the next dose?

## 2022-03-01 ENCOUNTER — Other Ambulatory Visit: Payer: Self-pay | Admitting: Endocrinology

## 2022-03-04 MED ORDER — TIRZEPATIDE 7.5 MG/0.5ML ~~LOC~~ SOAJ: 7.5000 mg | SUBCUTANEOUS | 3 refills | Status: DC

## 2022-03-04 NOTE — Telephone Encounter (Signed)
Attempted to contact pt

## 2022-03-04 NOTE — Addendum Note (Signed)
Addended by: Lauralyn Primes on: 03/04/2022 12:33 PM   Modules accepted: Orders

## 2022-03-12 ENCOUNTER — Ambulatory Visit
Admission: RE | Admit: 2022-03-12 | Discharge: 2022-03-12 | Disposition: A | Discharge status: Home / Self Care | Payer: BC Managed Care – PPO | Source: Ambulatory Visit | Attending: Family Medicine | Admitting: Family Medicine

## 2022-03-12 DIAGNOSIS — R200 Anesthesia of skin: Secondary | ICD-10-CM | POA: Diagnosis not present

## 2022-03-12 DIAGNOSIS — M19012 Primary osteoarthritis, left shoulder: Secondary | ICD-10-CM | POA: Diagnosis not present

## 2022-03-12 DIAGNOSIS — M25512 Pain in left shoulder: Secondary | ICD-10-CM

## 2022-03-12 DIAGNOSIS — M4802 Spinal stenosis, cervical region: Secondary | ICD-10-CM | POA: Diagnosis not present

## 2022-03-12 DIAGNOSIS — M5030 Other cervical disc degeneration, unspecified cervical region: Secondary | ICD-10-CM

## 2022-03-12 DIAGNOSIS — M25412 Effusion, left shoulder: Secondary | ICD-10-CM | POA: Diagnosis not present

## 2022-03-12 DIAGNOSIS — R531 Weakness: Secondary | ICD-10-CM | POA: Diagnosis not present

## 2022-03-15 ENCOUNTER — Other Ambulatory Visit: Payer: BC Managed Care – PPO

## 2022-03-24 DIAGNOSIS — M79603 Pain in arm, unspecified: Secondary | ICD-10-CM | POA: Diagnosis not present

## 2022-03-25 DIAGNOSIS — Z03818 Encounter for observation for suspected exposure to other biological agents ruled out: Secondary | ICD-10-CM | POA: Diagnosis not present

## 2022-03-25 DIAGNOSIS — I10 Essential (primary) hypertension: Secondary | ICD-10-CM | POA: Diagnosis not present

## 2022-03-25 DIAGNOSIS — R0981 Nasal congestion: Secondary | ICD-10-CM | POA: Diagnosis not present

## 2022-03-25 DIAGNOSIS — E1169 Type 2 diabetes mellitus with other specified complication: Secondary | ICD-10-CM | POA: Diagnosis not present

## 2022-03-27 DIAGNOSIS — M5412 Radiculopathy, cervical region: Secondary | ICD-10-CM | POA: Diagnosis not present

## 2022-04-04 DIAGNOSIS — M5412 Radiculopathy, cervical region: Secondary | ICD-10-CM | POA: Diagnosis not present

## 2022-04-07 DIAGNOSIS — M5412 Radiculopathy, cervical region: Secondary | ICD-10-CM | POA: Diagnosis not present

## 2022-04-07 DIAGNOSIS — E119 Type 2 diabetes mellitus without complications: Secondary | ICD-10-CM | POA: Diagnosis not present

## 2022-04-07 DIAGNOSIS — M4722 Other spondylosis with radiculopathy, cervical region: Secondary | ICD-10-CM | POA: Diagnosis not present

## 2022-04-07 DIAGNOSIS — M4802 Spinal stenosis, cervical region: Secondary | ICD-10-CM | POA: Diagnosis not present

## 2022-04-07 DIAGNOSIS — M50122 Cervical disc disorder at C5-C6 level with radiculopathy: Secondary | ICD-10-CM | POA: Diagnosis not present

## 2022-04-14 ENCOUNTER — Other Ambulatory Visit (INDEPENDENT_AMBULATORY_CARE_PROVIDER_SITE_OTHER): Payer: BC Managed Care – PPO

## 2022-04-14 DIAGNOSIS — E782 Mixed hyperlipidemia: Secondary | ICD-10-CM

## 2022-04-14 DIAGNOSIS — R8271 Bacteriuria: Secondary | ICD-10-CM | POA: Diagnosis not present

## 2022-04-14 DIAGNOSIS — N201 Calculus of ureter: Secondary | ICD-10-CM | POA: Diagnosis not present

## 2022-04-14 DIAGNOSIS — E1165 Type 2 diabetes mellitus with hyperglycemia: Secondary | ICD-10-CM

## 2022-04-14 DIAGNOSIS — R3121 Asymptomatic microscopic hematuria: Secondary | ICD-10-CM | POA: Diagnosis not present

## 2022-04-14 LAB — COMPREHENSIVE METABOLIC PANEL
ALT: 16 U/L (ref 0–35)
AST: 23 U/L (ref 0–37)
Albumin: 4 g/dL (ref 3.5–5.2)
Alkaline Phosphatase: 69 U/L (ref 39–117)
BUN: 12 mg/dL (ref 6–23)
CO2: 29 mEq/L (ref 19–32)
Calcium: 9.7 mg/dL (ref 8.4–10.5)
Chloride: 99 mEq/L (ref 96–112)
Creatinine, Ser: 0.81 mg/dL (ref 0.40–1.20)
GFR: 86.59 mL/min (ref >60.00)
Glucose, Bld: 180 mg/dL — ABNORMAL HIGH (ref 70–99)
Potassium: 4.6 mEq/L (ref 3.5–5.1)
Sodium: 138 mEq/L (ref 135–145)
Total Bilirubin: 0.4 mg/dL (ref 0.2–1.2)
Total Protein: 7.2 g/dL (ref 6.0–8.3)

## 2022-04-14 LAB — LIPID PANEL
Cholesterol: 172 mg/dL (ref 0–200)
HDL: 39.1 mg/dL (ref >39.00)
NonHDL: 133.17
Total CHOL/HDL Ratio: 4
Triglycerides: 205 mg/dL — ABNORMAL HIGH (ref 0.0–149.0)
VLDL: 41 mg/dL — ABNORMAL HIGH (ref 0.0–40.0)

## 2022-04-14 LAB — HEMOGLOBIN A1C: Hgb A1c MFr Bld: 7.9 % — ABNORMAL HIGH (ref 4.6–6.5)

## 2022-04-14 LAB — LDL CHOLESTEROL, DIRECT: Direct LDL: 104 mg/dL

## 2022-04-16 DIAGNOSIS — R3121 Asymptomatic microscopic hematuria: Secondary | ICD-10-CM | POA: Diagnosis not present

## 2022-04-16 DIAGNOSIS — N201 Calculus of ureter: Secondary | ICD-10-CM | POA: Diagnosis not present

## 2022-04-16 NOTE — Progress Notes (Unsigned)
Patient ID: Joan Walker, female   DOB: 28-Oct-1975, 47 y.o.   MRN: [id number]           Referring Physician: Donnie Coffin  Reason for Appointment: Endocrinology follow-up    History of Present Illness:   Problem 1: Hypothyroidism was first diagnosed in ?  2015  At the time of diagnosis patient was getting a routine lab test for her annual exam but not having any symptoms of  fatigue, cold sensitivity, difficulty concentrating, dry skin, weight gain or hair loss.  Details of her prior treatments are not available except results pending 2017 when her thyroid supplement was that 150 mcg daily Subsequently her doses had been progressively increased and apparently in 2018 she was taking 200 mcg daily but no labs from 2018 are available Other labs indicate persistently high TSH levels since 2017  RECENT history:  TSH on her initial consultation 5/19 was 7.7 when she was taking 250 mcg daily At that time she was complaining of feeling tired periodically Initially dose was increased to 300 mcg levothyroxine which improved her energy level  However subsequently has needed relatively lower doses  She had been taking LEVOTHYROXINE 200 mcg, once daily The dose was increased by 1 pill weekly last time Does not think she has any change in how she feels since the dosage change  She is taking the levothyroxine supplement before breakfast regularly  every day  Her TSH which was higher than usual at 9.5, is back to normal as of 12/23          Patient's weight history is as follows:  Wt Readings from Last 3 Encounters:  01/21/22 (!) 320 lb 12.8 oz (145.5 kg)  01/07/22 (!) 322 lb (146.1 kg)  12/18/21 (!) 330 lb (149.7 kg)    Thyroid function results have been as follows:  Lab Results  Component Value Date   TSH 0.73 01/10/2022   TSH 11.039 (H) 12/19/2021   TSH 9.49 (H) 10/14/2021   TSH 4.31 07/10/2021   FREET4 0.86 10/14/2021   FREET4 1.22 07/10/2021   FREET4 1.01 08/02/2020    FREET4 0.77 05/07/2020   T3FREE 4.0 07/22/2017   T3FREE 4.1 06/02/2017   Date   free T4   TSH   T3   04/02/2015   11.38   06/12/2015  6.02    08/10/2015    9.56    10/22/2015     4.99   04/23/2017   5.76    DIABETES: Type 2 diabetes  Diagnosis date:  07/2018  History:     She had gone to her PCP for annual exam in 7/20 and was found to have a glucose of 257 and baseline A1c of 10.4 She was sent to nutritionist, started on monitoring with a One Touch meter and also prescribed metformin 1 g twice daily  Non-insulin hypoglycemic drugs: Metformin 0.5 g a.m., 1 g at dinner,  Rybelsus 14 mg in a.m.           Side effects from medications:  Mild diarrhea from Metformin  Current self management, blood sugar patterns and problems identified:  Her A1c is again up to 8.6   7.4 compared to 6.4 and previously stable below 7   She has not checked her blood sugars and she does not like fingersticks  She was tried on Mounjaro but apparently she could not get the 5 mg dose after 1 month Also not clear if her blood sugars were better with this  She  is also not consistent with diet with sometimes eating sweets, eating on balanced meals or high fat lunches at times  She thinks her blood sugars were higher when she was being treated for kidney stone Today after breakfast blood sugar is over 200 but last month 161 fasting Weight is down slightly As before does not have any time or energy for exercise   Glucometer: One Touch.           Blood Glucose readings not available           Dietician visit: Most recent:   10/07/2018   Weight history:  Wt Readings from Last 3 Encounters:  01/21/22 (!) 320 lb 12.8 oz (145.5 kg)  01/07/22 (!) 322 lb (146.1 kg)  12/18/21 (!) 330 lb (149.7 kg)   Lab Results  Component Value Date   HGBA1C 7.9 (H) 04/14/2022   HGBA1C 8.6 (H) 01/10/2022   HGBA1C 8.5 (H) 01/07/2022   Lab Results  Component Value Date   MICROALBUR 1.5 10/14/2021   LDLCALC 104 (H)  10/14/2021   CREATININE 0.81 04/14/2022     Past Medical History:  Diagnosis Date   Arthritis    Asthma    DM2 (diabetes mellitus, type 2) (HCC)    GERD (gastroesophageal reflux disease)    History of kidney stones    Hypercholesteremia    Hypothyroidism    Morbid obesity ([place])     Past Surgical History:  Procedure Laterality Date   CHOLECYSTECTOMY     CYSTOSCOPY W/ URETERAL STENT PLACEMENT Left 12/19/2021   Procedure: CYSTOSCOPY WITH POSSIBLE LEFT RETROGRADE PYELOGRAM/URETERAL  AND   STENT PLACEMENT;  Surgeon: Irine Seal, MD;  Location: [facility];  Service: Urology;  Laterality: Left;   CYSTOSCOPY/URETEROSCOPY/HOLMIUM LASER/STENT PLACEMENT Left 01/16/2022   Procedure: CYSTOSCOPY LEF TURETEROSCOPY/HOLMIUM LASER/STENT PLACEMENT;  Surgeon: Irine Seal, MD;  Location: [facility];  Service: Urology;  Laterality: Left;   WISDOM TOOTH EXTRACTION      Family History  Problem Relation Age of Onset   Bladder Cancer Mother        67s   Prostate cancer Maternal Uncle    Lung cancer Paternal Aunt    Lung cancer Paternal Grandmother     Social History:  reports that she quit smoking about 18 years ago. Her smoking use included cigarettes. She has never used smokeless tobacco. She reports that she does not drink alcohol and does not use drugs.  Allergies:  Allergies  Allergen Reactions   Bee Pollen Swelling    Allergies as of 04/17/2022       Reactions   Bee Pollen Swelling        Medication List        Accurate as of April 16, 2022  8:37 PM. If you have any questions, ask your nurse or doctor.          acetaminophen 500 MG tablet Commonly known as: TYLENOL Take 1,000 mg by mouth every 6 (six) hours as needed for fever or moderate pain.   albuterol 108 (90 Base) MCG/ACT inhaler Commonly known as: VENTOLIN HFA Inhale 2 puffs into the lungs as needed for wheezing or shortness of breath.   calcium carbonate 500 MG chewable tablet Commonly known as: TUMS - dosed in mg  elemental calcium Chew 1 tablet (200 mg of elemental calcium total) by mouth 2 (two) times daily as needed for indigestion or heartburn.   CARBOXYMETHYLCELLUL-GLYCERIN OP Place 1 drop into both eyes as needed (dry eyes).   fluticasone 50  MCG/ACT nasal spray Commonly known as: FLONASE Place 1 spray into both nostrils as needed for allergies.   levothyroxine 200 MCG tablet Commonly known as: SYNTHROID 1 TABLET BEFORE BREAKFAST DAILY EXCEPT HALF TABLET ON SUNDAYS What changed:  how much to take how to take this when to take this additional instructions   lisinopril 20 MG tablet Commonly known as: ZESTRIL Take 20 mg by mouth every evening.   magnesium oxide 400 (240 Mg) MG tablet Commonly known as: MAG-OX Take 400 mg by mouth at bedtime.   metFORMIN 500 MG 24 hr tablet Commonly known as: GLUCOPHAGE-XR Take 4 tablets (2,000 mg total) by mouth daily with supper. What changed:  how much to take when to take this additional instructions   nystatin powder Commonly known as: MYCOSTATIN/NYSTOP Apply 1 Application topically as needed (rash/irritation).   oxybutynin 5 MG tablet Commonly known as: DITROPAN Take 1 tablet (5 mg total) by mouth every 8 (eight) hours as needed for bladder spasms.   oxyCODONE-acetaminophen 5-325 MG tablet Commonly known as: Percocet Take 1 tablet by mouth every 6 (six) hours as needed for severe pain.   tirzepatide 5 MG/0.5ML Pen Commonly known as: MOUNJARO Inject 5 mg into the skin once a week.   tirzepatide 7.5 MG/0.5ML Pen Commonly known as: MOUNJARO Inject 7.5 mg into the skin once a week.           Review of Systems   LIPIDS: Treated with atorvastatin 10 mg LDL 145 at baseline  LDL is still not well controlled  Triglycerides are currently over 200  Lab Results  Component Value Date   CHOL 172 04/14/2022   CHOL 180 01/10/2022   CHOL 171 10/14/2021   Lab Results  Component Value Date   HDL 39.10 04/14/2022   HDL 36.60  (L) 01/10/2022   HDL 40.20 10/14/2021   Lab Results  Component Value Date   LDLCALC 104 (H) 10/14/2021   LDLCALC 70 08/02/2020   LDLCALC 83 09/23/2019   Lab Results  Component Value Date   TRIG 205.0 (H) 04/14/2022   TRIG 226.0 (H) 01/10/2022   TRIG 134.0 10/14/2021   Lab Results  Component Value Date   CHOLHDL 4 04/14/2022   CHOLHDL 5 01/10/2022   CHOLHDL 4 10/14/2021   Lab Results  Component Value Date   LDLDIRECT 104.0 04/14/2022   LDLDIRECT 113.0 01/10/2022     Treated with lisinopril 30 mg for history of hypertension This is followed by her PCP   BP Readings from Last 3 Encounters:  01/21/22 118/76  01/16/22 132/89  01/07/22 135/70      Examination:    There were no vitals taken for this visit.  No ankle edema  Assessment:  HYPOTHYROIDISM, diagnosed in 2014 also and with no typical symptoms at baseline  Her levothyroxine dose is 200 mcg daily and TSH in therapeutic range   DIABETES with morbid obesity:  She is on Metformin 1500 mg a day and Rybelsus 14 mg daily Blood sugars are now not well-controlled and not clear if she was having better readings with Mounjaro which she could not refill  LIPIDS: Not well controlled and still high likely from poor diet Likely needs to be on a higher dose of Lipitor    PLAN:   LEVOTHYROXINE 200 mcg DAILY as before  She will try Mounjaro again starting 5 mg She will let us know if she has any difficulties but after 1 month can try to go to 7.5 mg Encouraged her to  start watching her diet better with balanced low-fat meals Also encouraged her to start a walking program although unlikely she will do this Also unlikely she will monitor her blood sugars at home  Atorvastatin to be increased to 20 mg daily and she can use the current supply at home  Follow-up in 3 months  There are no Patient Instructions on file for this visit.     Elayne Snare 04/16/2022, 8:37 PM    Note: This office note was prepared  with Dragon voice recognition system technology. Any transcriptional errors that result from this process are unintentional.

## 2022-04-17 ENCOUNTER — Encounter: Payer: Self-pay | Admitting: Endocrinology

## 2022-04-17 ENCOUNTER — Ambulatory Visit (INDEPENDENT_AMBULATORY_CARE_PROVIDER_SITE_OTHER): Payer: BC Managed Care – PPO | Admitting: Endocrinology

## 2022-04-17 VITALS — BP 136/70 | HR 94 | Ht 66.0 in | Wt 323.0 lb

## 2022-04-17 DIAGNOSIS — E782 Mixed hyperlipidemia: Secondary | ICD-10-CM | POA: Diagnosis not present

## 2022-04-17 DIAGNOSIS — E039 Hypothyroidism, unspecified: Secondary | ICD-10-CM | POA: Diagnosis not present

## 2022-04-17 DIAGNOSIS — E1165 Type 2 diabetes mellitus with hyperglycemia: Secondary | ICD-10-CM | POA: Diagnosis not present

## 2022-04-17 MED ORDER — ATORVASTATIN CALCIUM 20 MG PO TABS: 20.0000 mg | ORAL_TABLET | Freq: Every day | ORAL | 3 refills | Status: DC

## 2022-04-17 MED ORDER — TIRZEPATIDE 10 MG/0.5ML ~~LOC~~ SOAJ: 10.0000 mg | SUBCUTANEOUS | 1 refills | Status: DC

## 2022-04-24 ENCOUNTER — Other Ambulatory Visit (HOSPITAL_COMMUNITY): Payer: Self-pay | Admitting: Student

## 2022-04-24 DIAGNOSIS — M5412 Radiculopathy, cervical region: Secondary | ICD-10-CM

## 2022-04-25 ENCOUNTER — Ambulatory Visit (HOSPITAL_COMMUNITY)
Admission: RE | Admit: 2022-04-25 | Discharge: 2022-04-25 | Disposition: A | Discharge status: Home / Self Care | Payer: BC Managed Care – PPO | Source: Ambulatory Visit | Attending: Student | Admitting: Student

## 2022-04-25 DIAGNOSIS — M5412 Radiculopathy, cervical region: Secondary | ICD-10-CM | POA: Insufficient documentation

## 2022-04-25 DIAGNOSIS — M542 Cervicalgia: Secondary | ICD-10-CM | POA: Diagnosis not present

## 2022-05-10 ENCOUNTER — Other Ambulatory Visit: Payer: Self-pay | Admitting: Endocrinology

## 2022-05-14 DIAGNOSIS — M5412 Radiculopathy, cervical region: Secondary | ICD-10-CM | POA: Diagnosis not present

## 2022-05-20 DIAGNOSIS — K921 Melena: Secondary | ICD-10-CM | POA: Diagnosis not present

## 2022-05-20 DIAGNOSIS — Z6841 Body Mass Index (BMI) 40.0 and over, adult: Secondary | ICD-10-CM | POA: Diagnosis not present

## 2022-06-09 DIAGNOSIS — Z87442 Personal history of urinary calculi: Secondary | ICD-10-CM | POA: Diagnosis not present

## 2022-06-10 DIAGNOSIS — M5412 Radiculopathy, cervical region: Secondary | ICD-10-CM | POA: Diagnosis not present

## 2022-06-23 ENCOUNTER — Encounter: Payer: Self-pay | Admitting: Physical Therapy

## 2022-06-23 ENCOUNTER — Other Ambulatory Visit: Payer: Self-pay

## 2022-06-23 ENCOUNTER — Ambulatory Visit: Payer: BC Managed Care – PPO | Attending: Neurosurgery | Admitting: Physical Therapy

## 2022-06-23 DIAGNOSIS — M6281 Muscle weakness (generalized): Secondary | ICD-10-CM | POA: Insufficient documentation

## 2022-06-23 NOTE — Patient Instructions (Signed)
Access Code: [email or web address] URL: [email or web address] Date: 06/23/2022 Prepared by: Rosana Hoes  Exercises - Supine Shoulder Flexion Extension AAROM with Dowel  - 1-2 x daily - 10 reps - Sidelying Shoulder Abduction Palm Forward  - 1-2 x daily - 10 reps - Isometric Shoulder Abduction at Wall  - 1-2 x daily - 10 reps - 5 seconds hold - Isometric Shoulder Flexion at Wall  - 1-2 x daily - 10 reps - 5 seconds hold - Seated Scapular Retraction  - 1-2 x daily - 10 reps

## 2022-06-23 NOTE — Therapy (Signed)
OUTPATIENT PHYSICAL THERAPY EVALUATION   Patient Name: Joan Walker MRN: [id number] DOB:01-Feb-1975, 47 y.o., female Today's Date: 06/23/2022   END OF SESSION:  PT End of Session - 06/23/22 1616     Visit Number 1    Number of Visits 9    Date for PT Re-Evaluation 08/18/22    Authorization Type BC[employer]    PT Start Time 1530    PT Stop Time 1615    PT Time Calculation (min) 45 min    Activity Tolerance Patient tolerated treatment well    Behavior During Therapy WFL for tasks assessed/performed             Past Medical History:  Diagnosis Date   Arthritis    Asthma    DM2 (diabetes mellitus, type 2) (HCC)    GERD (gastroesophageal reflux disease)    History of kidney stones    Hypercholesteremia    Hypothyroidism    Morbid obesity (HCC)    Past Surgical History:  Procedure Laterality Date   CHOLECYSTECTOMY     CYSTOSCOPY W/ URETERAL STENT PLACEMENT Left 12/19/2021   Procedure: CYSTOSCOPY WITH POSSIBLE LEFT RETROGRADE PYELOGRAM/URETERAL  AND   STENT PLACEMENT;  Surgeon: Bjorn Pippin, MD;  Location: [facility];  Service: Urology;  Laterality: Left;   CYSTOSCOPY/URETEROSCOPY/HOLMIUM LASER/STENT PLACEMENT Left 01/16/2022   Procedure: CYSTOSCOPY LEF TURETEROSCOPY/HOLMIUM LASER/STENT PLACEMENT;  Surgeon: Bjorn Pippin, MD;  Location: [facility];  Service: Urology;  Laterality: Left;   WISDOM TOOTH EXTRACTION     Patient Active Problem List   Diagnosis Date Noted   Left ureteral stone 12/19/2021   Acute UTI 12/19/2021   Lactic acidosis 12/19/2021   AKI (acute kidney injury) (HCC) 12/19/2021   Acute hyponatremia 12/19/2021   Hypokalemia 12/19/2021   DM2 (diabetes mellitus, type 2) (HCC) 11/18/2018   Morbid obesity (HCC) 11/18/2018   Acquired hypothyroidism 06/02/2017    PCP: Irven Coe, MD  REFERRING PROVIDER: Julio Sicks, MD  REFERRING DIAG: Radiculopathy, cervical region  THERAPY DIAG:  Muscle weakness (generalized)  Rationale for Evaluation and Treatment:  Rehabilitation  ONSET DATE: 04/07/2022   SUBJECTIVE:                                                                                                                                                                                                        SUBJECTIVE STATEMENT: Patient reports and C5-6 ACDF on 04/07/2022, and then when she returned to work she wasn't able to raise her right arm over head while at work, she is a Runner, broadcasting/film/video, 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 She has difficulty with dressing, bathing, and putting her hair in a ponytail. She is able to raise her arm up to the side to about shoulder height but it feels very heavy. She states she doesn't really have any pain but more of a discomfort of the right upper arm when lifting her arm.   Hand dominance: Right  PERTINENT HISTORY:  ACDF at C5-C6 with interbody disc spacer on 04/07/2022  PAIN:  Are you having pain? No  PRECAUTIONS: None  WEIGHT BEARING RESTRICTIONS: No  FALLS:  Has patient fallen in last 6 months? No  OCCUPATION: Teacher  PLOF: Independent  PATIENT GOALS: Improve right arm strength so she can perform all dressing, driving, and work related tasks without limitation   OBJECTIVE:  PATIENT SURVEYS:  FOTO 63% functional status  COGNITION: Overall cognitive status: Within functional limits for tasks assessed  SENSATION: Seems intact  POSTURE:   Rounded shoulder and forward head posture  PALPATION: Non-tender to palpation   CERVICAL ROM:   Cervical AROM grossly WFL  UPPER EXTREMITY ROM:  Active ROM Right eval Left eval  Shoulder flexion 90 170  Shoulder extension    Shoulder abduction 90 170  Shoulder adduction    Shoulder extension    Shoulder internal rotation    Shoulder external rotation WFL   Elbow flexion WFL   Elbow extension WFL   Wrist flexion    Wrist extension    Wrist ulnar deviation    Wrist radial deviation    Wrist pronation    Wrist supination     (Blank  rows = not tested)  UPPER EXTREMITY MMT:  MMT Right eval Left eval  Shoulder flexion 2 5  Shoulder extension    Shoulder abduction 2 5  Shoulder adduction    Shoulder extension    Shoulder internal rotation 5 5  Shoulder external rotation 5 5  Middle trapezius    Lower trapezius    Elbow flexion 5 5  Elbow extension 5 5  Wrist flexion 5 5  Wrist extension 5 5  Wrist ulnar deviation    Wrist radial deviation    Wrist pronation    Wrist supination    Grip strength     (Blank rows = not tested)  FUNCTIONAL TESTS:  Not assessed   TODAY'S TREATMENT:    [facility] Adult PT Treatment:                                                DATE: 06/23/2022 Therapeutic Exercise: Supine dowel shoulder flexion Sidelying shoulder abduction Shoulder isometrics abduction and flexion Shoulder blade squeezes  PATIENT EDUCATION:  Education details: Exam findings, POC, HEP Person educated: Patient Education method: Programmer, multimedia, Demonstration, Tactile cues, Verbal cues, and Handouts Education comprehension: verbalized understanding, returned demonstration, verbal cues required, tactile cues required, and needs further education  HOME EXERCISE PROGRAM: Access Code: [email or web address]    ASSESSMENT: CLINICAL IMPRESSION: Patient is a 47 y.o. female who was seen today for physical therapy evaluation and treatment for right shoulder weakness following anterior cervical discectomy on 04/07/2022. She demonstrate weakness that seems consistent with C5 myotome. Primary weakness in shoulder elevation but she does exhibit shoulder PROM and cervical AROM grossly WFL.  OBJECTIVE IMPAIRMENTS: decreased activity tolerance, decreased ROM, decreased strength, postural dysfunction, and pain.   ACTIVITY LIMITATIONS: carrying, lifting, bathing, dressing, reach over head,  and hygiene/grooming  PARTICIPATION LIMITATIONS: meal prep, cleaning, laundry, driving, shopping, and occupation  PERSONAL FACTORS: Fitness, Past/current  experiences, and Time since onset of injury/illness/exacerbation are also affecting patient's functional outcome.   REHAB POTENTIAL: Good  CLINICAL DECISION MAKING: Stable/uncomplicated  EVALUATION COMPLEXITY: Low   GOALS: Goals reviewed with patient? Yes  SHORT TERM GOALS: Target date: 07/21/2022  Patient will be I with initial HEP in order to progress with therapy. Baseline: HEP provided at eval Goal status: INITIAL  LONG TERM GOALS: Target date: 08/18/2022  Patient will be I with final HEP to maintain progress from PT. Baseline: HEP provided at eval Goal status: INITIAL  2.  Patient will report >/= 69% status on FOTO to indicate improved functional ability. Baseline: 63% functional status Goal status: INITIAL  3.  Patient will demonstrate right shoulder flexion AROM >/= 160 deg in order to be able to perform all grooming and dressing tasks Baseline: right shoulder AROM flexion 90 deg Goal status: INITIAL  4.  Patient will demonstrate right shoulder flexion strength >/= 4/5 MMT in order to improve ability to write on board at shoulder level  Baseline: right shoulder flexion strength 2/5 MMT Goal status: INITIAL   PLAN: PT FREQUENCY: 1x/week  PT DURATION: 8 weeks  PLANNED INTERVENTIONS: Therapeutic exercises, Therapeutic activity, Neuromuscular re-education, Balance training, Gait training, Patient/Family education, Self Care, Joint mobilization, Aquatic Therapy, Dry Needling, Electrical stimulation, Cryotherapy, Moist heat, Manual therapy, and Re-evaluation  PLAN FOR NEXT SESSION: Review HEP and progress PRN, focus on right shoulder strengthening from supine-inclined-upright, shoulder AAROM progressing to AROM for flexion   Rosana Hoes, PT, DPT, LAT, ATC 06/23/22  4:32 PM Phone: [phone] Fax: [phone]

## 2022-06-27 NOTE — Therapy (Signed)
OUTPATIENT PHYSICAL THERAPY TREATMENT NOTE   Patient Name: Joan Walker MRN: [id number] DOB:19-Mar-1975, 47 y.o., female Today's Date: 06/27/2022  PCP: Irven Coe, MD REFERRING PROVIDER: Julio Sicks, MD   END OF SESSION:    Past Medical History:  Diagnosis Date   Arthritis    Asthma    DM2 (diabetes mellitus, type 2) (HCC)    GERD (gastroesophageal reflux disease)    History of kidney stones    Hypercholesteremia    Hypothyroidism    Morbid obesity (HCC)    Past Surgical History:  Procedure Laterality Date   CHOLECYSTECTOMY     CYSTOSCOPY W/ URETERAL STENT PLACEMENT Left 12/19/2021   Procedure: CYSTOSCOPY WITH POSSIBLE LEFT RETROGRADE PYELOGRAM/URETERAL  AND   STENT PLACEMENT;  Surgeon: Bjorn Pippin, MD;  Location: [facility] OR;  Service: Urology;  Laterality: Left;   CYSTOSCOPY/URETEROSCOPY/HOLMIUM LASER/STENT PLACEMENT Left 01/16/2022   Procedure: CYSTOSCOPY LEF TURETEROSCOPY/HOLMIUM LASER/STENT PLACEMENT;  Surgeon: Bjorn Pippin, MD;  Location: [facility] ORS;  Service: Urology;  Laterality: Left;   WISDOM TOOTH EXTRACTION     Patient Active Problem List   Diagnosis Date Noted   Left ureteral stone 12/19/2021   Acute UTI 12/19/2021   Lactic acidosis 12/19/2021   AKI (acute kidney injury) (HCC) 12/19/2021   Acute hyponatremia 12/19/2021   Hypokalemia 12/19/2021   DM2 (diabetes mellitus, type 2) (HCC) 11/18/2018   Morbid obesity (HCC) 11/18/2018   Acquired hypothyroidism 06/02/2017    REFERRING DIAG: Radiculopathy, cervical region   THERAPY DIAG:  No diagnosis found.  Rationale for Evaluation and Treatment Rehabilitation  PERTINENT HISTORY: ACDF at C5-C6 with interbody disc spacer on 04/07/2022   PRECAUTIONS: None    SUBJECTIVE:                                                                                                                                                                                     SUBJECTIVE STATEMENT:  ***  PAIN:  Are you having pain?  No   OBJECTIVE: (objective measures completed at initial evaluation unless otherwise dated) PATIENT SURVEYS:  FOTO 63% functional status    SENSATION: Seems intact   POSTURE:             Rounded shoulder and forward head posture   PALPATION: Non-tender to palpation         CERVICAL ROM:            Cervical AROM grossly WFL   UPPER EXTREMITY ROM:   Active ROM Right eval Left eval  Shoulder flexion 90 170  Shoulder extension      Shoulder abduction 90 170  Shoulder adduction      Shoulder extension  Shoulder internal rotation      Shoulder external rotation WFL    Elbow flexion WFL    Elbow extension WFL    Wrist flexion      Wrist extension      Wrist ulnar deviation      Wrist radial deviation      Wrist pronation      Wrist supination       (Blank rows = not tested)   UPPER EXTREMITY MMT:   MMT Right eval Left eval  Shoulder flexion 2 5  Shoulder extension      Shoulder abduction 2 5  Shoulder adduction      Shoulder extension      Shoulder internal rotation 5 5  Shoulder external rotation 5 5  Middle trapezius      Lower trapezius      Elbow flexion 5 5  Elbow extension 5 5  Wrist flexion 5 5  Wrist extension 5 5  Wrist ulnar deviation      Wrist radial deviation      Wrist pronation      Wrist supination      Grip strength       (Blank rows = not tested)   FUNCTIONAL TESTS:  Not assessed     TODAY'S TREATMENT:    [facility] Adult PT Treatment:                                                DATE: 06/30/2022 Therapeutic Exercise: Supine dowel shoulder flexion Sidelying shoulder abduction Shoulder isometrics abduction and flexion Shoulder blade squeezes   [facility] Adult PT Treatment:                                                DATE: 06/23/2022 Therapeutic Exercise: Supine dowel shoulder flexion Sidelying shoulder abduction Shoulder isometrics abduction and flexion Shoulder blade squeezes   PATIENT EDUCATION:  Education details:  HEP Person educated: Patient Education method: Programmer, multimedia, Facilities manager, Actor cues, Verbal cues Education comprehension: verbalized understanding, returned demonstration, verbal cues required, tactile cues required, and needs further education   HOME EXERCISE PROGRAM: Access Code: [email or web address]      ASSESSMENT: CLINICAL IMPRESSION: Patient tolerated therapy well with no adverse effects. *** Patient would benefit from continued skilled PT to progress shoulder strength and function in order to improve ability to work without limitation and maximize functional ability.  Patient is a 47 y.o. female who was seen today for physical therapy evaluation and treatment for right shoulder weakness following anterior cervical discectomy on 04/07/2022. She demonstrate weakness that seems consistent with C5 myotome. Primary weakness in shoulder elevation but she does exhibit shoulder PROM and cervical AROM grossly WFL.   OBJECTIVE IMPAIRMENTS: decreased activity tolerance, decreased ROM, decreased strength, postural dysfunction, and pain.    ACTIVITY LIMITATIONS: carrying, lifting, bathing, dressing, reach over head, and hygiene/grooming   PARTICIPATION LIMITATIONS: meal prep, cleaning, laundry, driving, shopping, and occupation   PERSONAL FACTORS: Fitness, Past/current experiences, and Time since onset of injury/illness/exacerbation are also affecting patient's functional outcome.      GOALS: Goals reviewed with patient? Yes   SHORT TERM GOALS: Target date: 07/21/2022   Patient will be I with initial HEP in order to progress with  therapy. Baseline: HEP provided at eval Goal status: INITIAL   LONG TERM GOALS: Target date: 08/18/2022   Patient will be I with final HEP to maintain progress from PT. Baseline: HEP provided at eval Goal status: INITIAL   2.  Patient will report >/= 69% status on FOTO to indicate improved functional ability. Baseline: 63% functional status Goal status: INITIAL   3.   Patient will demonstrate right shoulder flexion AROM >/= 160 deg in order to be able to perform all grooming and dressing tasks Baseline: right shoulder AROM flexion 90 deg Goal status: INITIAL   4.  Patient will demonstrate right shoulder flexion strength >/= 4/5 MMT in order to improve ability to write on board at shoulder level  Baseline: right shoulder flexion strength 2/5 MMT Goal status: INITIAL     PLAN: PT FREQUENCY: 1x/week   PT DURATION: 8 weeks   PLANNED INTERVENTIONS: Therapeutic exercises, Therapeutic activity, Neuromuscular re-education, Balance training, Gait training, Patient/Family education, Self Care, Joint mobilization, Aquatic Therapy, Dry Needling, Electrical stimulation, Cryotherapy, Moist heat, Manual therapy, and Re-evaluation   PLAN FOR NEXT SESSION: Review HEP and progress PRN, focus on right shoulder strengthening from supine-inclined-upright, shoulder AAROM progressing to AROM for flexion   Rosana Hoes, PT, DPT, LAT, ATC 06/27/22  8:53 AM Phone: [phone] Fax: [phone]

## 2022-06-30 ENCOUNTER — Encounter: Payer: Self-pay | Admitting: Physical Therapy

## 2022-06-30 ENCOUNTER — Ambulatory Visit: Payer: BC Managed Care – PPO | Admitting: Physical Therapy

## 2022-06-30 ENCOUNTER — Other Ambulatory Visit: Payer: Self-pay

## 2022-06-30 DIAGNOSIS — M6281 Muscle weakness (generalized): Secondary | ICD-10-CM

## 2022-06-30 DIAGNOSIS — Z1211 Encounter for screening for malignant neoplasm of colon: Secondary | ICD-10-CM | POA: Diagnosis not present

## 2022-06-30 DIAGNOSIS — K219 Gastro-esophageal reflux disease without esophagitis: Secondary | ICD-10-CM | POA: Diagnosis not present

## 2022-06-30 DIAGNOSIS — Z01818 Encounter for other preprocedural examination: Secondary | ICD-10-CM | POA: Diagnosis not present

## 2022-06-30 NOTE — Patient Instructions (Signed)
Access Code: [email or web address] URL: [email or web address] Date: 06/30/2022 Prepared by: Rosana Hoes  Exercises - Supine Shoulder Flexion Extension AAROM with Dowel  - 1-2 x daily - 2 sets - 10 reps - Supine Shoulder Circles with Weight  - 1-2 x daily - 2 sets - 10 reps - Supine Shoulder Horizontal Abduction with Resistance  - 1-2 x daily - 2 sets - 10 reps - Sidelying Shoulder Abduction Palm Forward  - 1-2 x daily - 2 sets - 10 reps - Isometric Shoulder Abduction at Wall  - 1-2 x daily - 10 reps - 5 seconds hold - Isometric Shoulder Flexion at Wall  - 1-2 x daily - 10 reps - 5 seconds hold - Standing Row with Anchored Resistance  - 1-2 x daily - 2 reps - 10 hold

## 2022-07-01 DIAGNOSIS — R3121 Asymptomatic microscopic hematuria: Secondary | ICD-10-CM | POA: Diagnosis not present

## 2022-07-01 DIAGNOSIS — N200 Calculus of kidney: Secondary | ICD-10-CM | POA: Diagnosis not present

## 2022-07-01 DIAGNOSIS — R319 Hematuria, unspecified: Secondary | ICD-10-CM | POA: Diagnosis not present

## 2022-07-04 ENCOUNTER — Other Ambulatory Visit (INDEPENDENT_AMBULATORY_CARE_PROVIDER_SITE_OTHER): Payer: BC Managed Care – PPO

## 2022-07-04 DIAGNOSIS — E782 Mixed hyperlipidemia: Secondary | ICD-10-CM | POA: Diagnosis not present

## 2022-07-04 DIAGNOSIS — E039 Hypothyroidism, unspecified: Secondary | ICD-10-CM

## 2022-07-04 DIAGNOSIS — E1165 Type 2 diabetes mellitus with hyperglycemia: Secondary | ICD-10-CM

## 2022-07-04 LAB — COMPREHENSIVE METABOLIC PANEL
ALT: 27 U/L (ref 0–35)
AST: 35 U/L (ref 0–37)
Albumin: 4.1 g/dL (ref 3.5–5.2)
Alkaline Phosphatase: 70 U/L (ref 39–117)
BUN: 12 mg/dL (ref 6–23)
CO2: 29 mEq/L (ref 19–32)
Calcium: 9.5 mg/dL (ref 8.4–10.5)
Chloride: 97 mEq/L (ref 96–112)
Creatinine, Ser: 0.79 mg/dL (ref 0.40–1.20)
GFR: 89.09 mL/min (ref >60.00)
Glucose, Bld: 186 mg/dL — ABNORMAL HIGH (ref 70–99)
Potassium: 4.4 mEq/L (ref 3.5–5.1)
Sodium: 138 mEq/L (ref 135–145)
Total Bilirubin: 0.5 mg/dL (ref 0.2–1.2)
Total Protein: 6.9 g/dL (ref 6.0–8.3)

## 2022-07-04 LAB — T4, FREE: Free T4: 1.02 ng/dL (ref 0.60–1.60)

## 2022-07-04 LAB — HEMOGLOBIN A1C: Hgb A1c MFr Bld: 8.2 % — ABNORMAL HIGH (ref 4.6–6.5)

## 2022-07-04 LAB — LIPID PANEL
Cholesterol: 175 mg/dL (ref 0–200)
HDL: 34.4 mg/dL — ABNORMAL LOW (ref >39.00)
LDL Cholesterol: 101 mg/dL — ABNORMAL HIGH (ref 0–99)
NonHDL: 140.62
Total CHOL/HDL Ratio: 5
Triglycerides: 197 mg/dL — ABNORMAL HIGH (ref 0.0–149.0)
VLDL: 39.4 mg/dL (ref 0.0–40.0)

## 2022-07-04 LAB — TSH: TSH: 7.36 u[IU]/mL — ABNORMAL HIGH (ref 0.35–5.50)

## 2022-07-09 ENCOUNTER — Ambulatory Visit (INDEPENDENT_AMBULATORY_CARE_PROVIDER_SITE_OTHER): Payer: BC Managed Care – PPO | Admitting: Endocrinology

## 2022-07-09 ENCOUNTER — Encounter: Payer: Self-pay | Admitting: Endocrinology

## 2022-07-09 ENCOUNTER — Ambulatory Visit: Payer: BC Managed Care – PPO | Admitting: Physical Therapy

## 2022-07-09 ENCOUNTER — Encounter: Payer: Self-pay | Admitting: Physical Therapy

## 2022-07-09 VITALS — BP 126/78 | HR 84 | Ht 66.0 in | Wt 316.0 lb

## 2022-07-09 DIAGNOSIS — Z7985 Long-term (current) use of injectable non-insulin antidiabetic drugs: Secondary | ICD-10-CM | POA: Diagnosis not present

## 2022-07-09 DIAGNOSIS — Z7984 Long term (current) use of oral hypoglycemic drugs: Secondary | ICD-10-CM

## 2022-07-09 DIAGNOSIS — M6281 Muscle weakness (generalized): Secondary | ICD-10-CM | POA: Diagnosis not present

## 2022-07-09 DIAGNOSIS — R3121 Asymptomatic microscopic hematuria: Secondary | ICD-10-CM | POA: Diagnosis not present

## 2022-07-09 DIAGNOSIS — N200 Calculus of kidney: Secondary | ICD-10-CM | POA: Diagnosis not present

## 2022-07-09 DIAGNOSIS — E1165 Type 2 diabetes mellitus with hyperglycemia: Secondary | ICD-10-CM

## 2022-07-09 DIAGNOSIS — R82994 Hypercalciuria: Secondary | ICD-10-CM | POA: Diagnosis not present

## 2022-07-09 DIAGNOSIS — E039 Hypothyroidism, unspecified: Secondary | ICD-10-CM

## 2022-07-09 DIAGNOSIS — E782 Mixed hyperlipidemia: Secondary | ICD-10-CM | POA: Diagnosis not present

## 2022-07-09 LAB — POCT GLUCOSE (DEVICE FOR HOME USE): Glucose Fasting, POC: 209 mg/dL — AB (ref 70–99)

## 2022-07-09 MED ORDER — GLIMEPIRIDE 2 MG PO TABS
2.0000 mg | ORAL_TABLET | Freq: Every day | ORAL | 3 refills | Status: DC
Start: 2022-07-09 — End: 2022-11-07

## 2022-07-09 NOTE — Therapy (Signed)
OUTPATIENT PHYSICAL THERAPY TREATMENT NOTE   Patient Name: Joan Walker MRN: [id number] DOB:[birth year], 47 y.o., female Today's Date: 07/09/2022  PCP: Irven Coe, MD REFERRING PROVIDER: Julio Sicks, MD   END OF SESSION:   PT End of Session - 07/09/22 1058     Visit Number 3    Number of Visits 9    Date for PT Re-Evaluation 08/18/22    Authorization Type [employer]    PT Start Time 1100    PT Stop Time 1144    PT Time Calculation (min) 44 min             Past Medical History:  Diagnosis Date   Arthritis    Asthma    DM2 (diabetes mellitus, type 2) (HCC)    GERD (gastroesophageal reflux disease)    History of kidney stones    Hypercholesteremia    Hypothyroidism    Morbid obesity (HCC)    Past Surgical History:  Procedure Laterality Date   CHOLECYSTECTOMY     CYSTOSCOPY W/ URETERAL STENT PLACEMENT Left 12/19/2021   Procedure: CYSTOSCOPY WITH POSSIBLE LEFT RETROGRADE PYELOGRAM/URETERAL  AND   STENT PLACEMENT;  Surgeon: Bjorn Pippin, MD;  Location: [facility] OR;  Service: Urology;  Laterality: Left;   CYSTOSCOPY/URETEROSCOPY/HOLMIUM LASER/STENT PLACEMENT Left 01/16/2022   Procedure: CYSTOSCOPY LEF TURETEROSCOPY/HOLMIUM LASER/STENT PLACEMENT;  Surgeon: Bjorn Pippin, MD;  Location: [facility] ORS;  Service: Urology;  Laterality: Left;   WISDOM TOOTH EXTRACTION     Patient Active Problem List   Diagnosis Date Noted   Left ureteral stone 12/19/2021   Acute UTI 12/19/2021   Lactic acidosis 12/19/2021   AKI (acute kidney injury) (HCC) 12/19/2021   Acute hyponatremia 12/19/2021   Hypokalemia 12/19/2021   DM2 (diabetes mellitus, type 2) (HCC) 11/18/2018   Morbid obesity (HCC) 11/18/2018   Acquired hypothyroidism 06/02/2017    REFERRING DIAG: Radiculopathy, cervical region   THERAPY DIAG:  Muscle weakness (generalized)  Rationale for Evaluation and Treatment Rehabilitation  PERTINENT HISTORY: ACDF at C5-C6 with interbody disc spacer on 04/07/2022   PRECAUTIONS: None     SUBJECTIVE:                                                                                                                                                                                     SUBJECTIVE STATEMENT:  Patient reports she is sore today from laundry and shredding a lot of papers over the weekend.   PAIN:  Are you having pain? No   OBJECTIVE: (objective measures completed at initial evaluation unless otherwise dated) PATIENT SURVEYS:  FOTO 63% functional status    SENSATION: Seems intact   POSTURE:  Rounded shoulder and forward head posture   PALPATION: Non-tender to palpation         CERVICAL ROM:            Cervical AROM grossly WFL   UPPER EXTREMITY ROM:   Active ROM Right eval Left eval Right   Shoulder flexion 90 170 105  Shoulder extension       Shoulder abduction 90 170 97  Shoulder adduction       Shoulder extension       Shoulder internal rotation       Shoulder external rotation WFL     Elbow flexion WFL     Elbow extension WFL     Wrist flexion       Wrist extension       Wrist ulnar deviation       Wrist radial deviation       Wrist pronation       Wrist supination        (Blank rows = not tested)   UPPER EXTREMITY MMT:   MMT Right eval Left eval Rt 06/30/22  Shoulder flexion 2 5 2   Shoulder extension       Shoulder abduction 2 5   Shoulder adduction       Shoulder extension       Shoulder internal rotation 5 5   Shoulder external rotation 5 5   Middle trapezius       Lower trapezius       Elbow flexion 5 5   Elbow extension 5 5   Wrist flexion 5 5   Wrist extension 5 5   Wrist ulnar deviation       Wrist radial deviation       Wrist pronation       Wrist supination       Grip strength        (Blank rows = not tested)   FUNCTIONAL TESTS:  Not assessed     TODAY'S TREATMENT:    [facility] Adult PT Treatment:                                                DATE: 07/09/22 Therapeutic Exercise: UBE Level 1 2  minutes each way  Red band row 10 x 2  Red band ext 10 x 2  Supine dowel shoulder flexion 2 x 10 Supine right shoulder flexion AROM x 10 Sidelying shoulder abduction 2 x 10 Sidelying shoulder flexion 2 x 8  Sidelying shoulder ER 10 x 2  Supine protraction 10 x 2     [facility] Adult PT Treatment:                                                DATE: 06/30/2022 Therapeutic Exercise: UBE L1 x 4 min (fwd/bwd) while taking subjective  Supine dowel shoulder flexion 2 x 10 Supine shoulder circles with 2# 2 x 10 cw/ccw Supine horizontal abduction with red 2 x 10 Sidelying shoulder abduction 2 x 10 Row with red 2 x 10 Extension with yellow 2 x 10 Wall walk with physioball 2 x 10   [facility] Adult PT Treatment:  DATE: 06/23/2022 Therapeutic Exercise: Supine dowel shoulder flexion Sidelying shoulder abduction Shoulder isometrics abduction and flexion Shoulder blade squeezes   PATIENT EDUCATION:  Education details: HEP update Person educated: Patient Education method: Explanation, Demonstration, Tactile cues, Verbal cues, Handout Education comprehension: verbalized understanding, returned demonstration, verbal cues required, tactile cues required, and needs further education   HOME EXERCISE PROGRAM: Access Code: [email or web address]      ASSESSMENT: CLINICAL IMPRESSION: Patient tolerated therapy well with no adverse effects. Therapy focused on progressive strengthening and active motion for the right shoulder complex with good tolerance. Her AROM has improved in right shoulder. Patient does exhibit quick muscular fatigue with exercises. She notes improved ability to wash her hair  and do meal prep (dicing and chopping). She was sore in shoulder at end of session.   Patient would benefit from continued skilled PT to progress shoulder strength and function in order to improve ability to work without limitation and maximize functional ability.   OBJECTIVE  IMPAIRMENTS: decreased activity tolerance, decreased ROM, decreased strength, postural dysfunction, and pain.    ACTIVITY LIMITATIONS: carrying, lifting, bathing, dressing, reach over head, and hygiene/grooming   PARTICIPATION LIMITATIONS: meal prep, cleaning, laundry, driving, shopping, and occupation   PERSONAL FACTORS: Fitness, Past/current experiences, and Time since onset of injury/illness/exacerbation are also affecting patient's functional outcome.      GOALS: Goals reviewed with patient? Yes   SHORT TERM GOALS: Target date: 07/21/2022   Patient will be I with initial HEP in order to progress with therapy. Baseline: HEP provided at eval Goal status: INITIAL   LONG TERM GOALS: Target date: 08/18/2022   Patient will be I with final HEP to maintain progress from PT. Baseline: HEP provided at eval Goal status: INITIAL   2.  Patient will report >/= 69% status on FOTO to indicate improved functional ability. Baseline: 63% functional status Goal status: INITIAL   3.  Patient will demonstrate right shoulder flexion AROM >/= 160 deg in order to be able to perform all grooming and dressing tasks Baseline: right shoulder AROM flexion 90 deg Goal status: INITIAL   4.  Patient will demonstrate right shoulder flexion strength >/= 4/5 MMT in order to improve ability to write on board at shoulder level  Baseline: right shoulder flexion strength 2/5 MMT Goal status: INITIAL     PLAN: PT FREQUENCY: 1x/week   PT DURATION: 8 weeks   PLANNED INTERVENTIONS: Therapeutic exercises, Therapeutic activity, Neuromuscular re-education, Balance training, Gait training, Patient/Family education, Self Care, Joint mobilization, Aquatic Therapy, Dry Needling, Electrical stimulation, Cryotherapy, Moist heat, Manual therapy, and Re-evaluation   PLAN FOR NEXT SESSION: Review HEP and progress PRN, focus on right shoulder strengthening from supine-inclined-upright, shoulder AAROM progressing to AROM for  flexion   Jannette Spanner, PTA 07/09/22 12:42 PM Phone: [phone] Fax: [phone]

## 2022-07-09 NOTE — Patient Instructions (Signed)
Walk daily 

## 2022-07-09 NOTE — Progress Notes (Signed)
Patient ID: Joan Walker, female   DOB: 03-26-1975, 47 y.o.   MRN: [id number]            Reason for Appointment: Endocrinology follow-up    History of Present Illness:   Problem 1: Hypothyroidism was first diagnosed in ?  2015  At the time of diagnosis patient was getting a routine lab test for her annual exam but not having any symptoms of  fatigue, cold sensitivity, difficulty concentrating, dry skin, weight gain or hair loss.  Details of her prior treatments are not available except results pending 2017 when her thyroid supplement was that 150 mcg daily Subsequently her doses had been progressively increased and apparently in 2018 she was taking 200 mcg daily but no labs from 2018 are available Other labs indicate persistently high TSH levels since 2017  RECENT history:  TSH on her initial consultation 5/19 was 7.7 when she was taking 250 mcg daily At that time she was complaining of feeling tired periodically Initially dose was increased to 300 mcg levothyroxine which improved her energy level  However subsequently has needed relatively lower doses  She had been taking LEVOTHYROXINE 200 mcg, once daily  She is taking the levothyroxine supplement before breakfast consistently but she did not take it for a day when she had a CT scan 3 days prior to her labs  Her TSH was  normal as of 12/23 but now 7.4          Patient's weight history is as follows:  Wt Readings from Last 3 Encounters:  07/09/22 (!) 316 lb (143.3 kg)  04/17/22 (!) 323 lb (146.5 kg)  01/21/22 (!) 320 lb 12.8 oz (145.5 kg)    Thyroid function results have been as follows:  Lab Results  Component Value Date   TSH 7.36 (H) 07/04/2022   TSH 0.73 01/10/2022   TSH 11.039 (H) 12/19/2021   TSH 9.49 (H) 10/14/2021   FREET4 1.02 07/04/2022   FREET4 0.86 10/14/2021   FREET4 1.22 07/10/2021   FREET4 1.01 08/02/2020   T3FREE 4.0 07/22/2017   T3FREE 4.1 06/02/2017    DIABETES: Type 2  diabetes  Diagnosis date:  07/2018  History:     She had gone to her PCP for annual exam in 7/20 and was found to have a glucose of 257 and baseline A1c of 10.4 She was sent to nutritionist, started on monitoring with a One Touch meter and also prescribed metformin 1 g twice daily  Non-insulin hypoglycemic drugs: Metformin 0.5 g a.m., 1 g at dinner,  Mounjaro 7.5 mg weekly       Side effects from medications:  Mild diarrhea from Metformin  Current self management, blood sugar patterns and problems identified:  Her A1c is slightly higher at 8.2, was 7.9  She does not check blood sugars at home because she does not like fingersticks  She has been able to do continue Mounjaro and was out of it for only about a week or so Taking 7.5 mg regularly, does not complain of nausea now; she just picked up the prescription this week for this She appears to have lost 8 pounds since March Previously had been having difficulty with weight loss Again she is not consistent with her diet Appears to have significantly high glucose readings with fasting 186 and today 209; she says she had a lot of pasta last night As before does not have any time or energy for exercise but she thinks she is doing a little  more physical activity at home with cleaning   Glucometer: One Touch.           Blood Glucose readings not available           Dietician visit: Most recent:   10/07/2018   Weight history:  Wt Readings from Last 3 Encounters:  07/09/22 (!) 316 lb (143.3 kg)  04/17/22 (!) 323 lb (146.5 kg)  01/21/22 (!) 320 lb 12.8 oz (145.5 kg)   Lab Results  Component Value Date   HGBA1C 8.2 (H) 07/04/2022   HGBA1C 7.9 (H) 04/14/2022   HGBA1C 8.6 (H) 01/10/2022   Lab Results  Component Value Date   MICROALBUR 1.5 10/14/2021   LDLCALC 101 (H) 07/04/2022   CREATININE 0.79 07/04/2022     Past Medical History:  Diagnosis Date   Arthritis    Asthma    DM2 (diabetes mellitus, type 2) (HCC)    GERD  (gastroesophageal reflux disease)    History of kidney stones    Hypercholesteremia    Hypothyroidism    Morbid obesity (HCC)     Past Surgical History:  Procedure Laterality Date   CHOLECYSTECTOMY     CYSTOSCOPY W/ URETERAL STENT PLACEMENT Left 12/19/2021   Procedure: CYSTOSCOPY WITH POSSIBLE LEFT RETROGRADE PYELOGRAM/URETERAL  AND   STENT PLACEMENT;  Surgeon: Bjorn Pippin, MD;  Location: [facility] OR;  Service: Urology;  Laterality: Left;   CYSTOSCOPY/URETEROSCOPY/HOLMIUM LASER/STENT PLACEMENT Left 01/16/2022   Procedure: CYSTOSCOPY LEF TURETEROSCOPY/HOLMIUM LASER/STENT PLACEMENT;  Surgeon: Bjorn Pippin, MD;  Location: [facility] ORS;  Service: Urology;  Laterality: Left;   WISDOM TOOTH EXTRACTION      Family History  Problem Relation Age of Onset   Bladder Cancer Mother        61s   Prostate cancer Maternal Uncle    Lung cancer Paternal Aunt    Lung cancer Paternal Grandmother     Social History:  reports that she quit smoking about 18 years ago. Her smoking use included cigarettes. She has never used smokeless tobacco. She reports that she does not drink alcohol and does not use drugs.  Allergies:  Allergies  Allergen Reactions   Bee Pollen Swelling    Allergies as of 07/09/2022       Reactions   Bee Pollen Swelling        Medication List        Accurate as of July 09, 2022 10:02 AM. If you have any questions, ask your nurse or doctor.          acetaminophen 500 MG tablet Commonly known as: TYLENOL Take 1,000 mg by mouth every 6 (six) hours as needed for fever or moderate pain.   albuterol 108 (90 Base) MCG/ACT inhaler Commonly known as: VENTOLIN HFA Inhale 2 puffs into the lungs as needed for wheezing or shortness of breath.   atorvastatin 20 MG tablet Commonly known as: LIPITOR Take 1 tablet (20 mg total) by mouth daily.   calcium carbonate 500 MG chewable tablet Commonly known as: TUMS - dosed in mg elemental calcium Chew 1 tablet (200 mg of elemental calcium  total) by mouth 2 (two) times daily as needed for indigestion or heartburn.   CARBOXYMETHYLCELLUL-GLYCERIN OP Place 1 drop into both eyes as needed (dry eyes).   fluticasone 50 MCG/ACT nasal spray Commonly known as: FLONASE Place 1 spray into both nostrils as needed for allergies.   levothyroxine 200 MCG tablet Commonly known as: SYNTHROID 1 TABLET BEFORE BREAKFAST DAILY EXCEPT HALF TABLET ON SUNDAYS What changed:  how much to take how to take this when to take this additional instructions   lisinopril 20 MG tablet Commonly known as: ZESTRIL Take 20 mg by mouth every evening.   magnesium oxide 400 (240 Mg) MG tablet Commonly known as: MAG-OX Take 400 mg by mouth at bedtime.   metFORMIN 500 MG 24 hr tablet Commonly known as: GLUCOPHAGE-XR Take 4 tablets (2,000 mg total) by mouth daily with supper. What changed:  how much to take when to take this additional instructions   nystatin powder Commonly known as: MYCOSTATIN/NYSTOP Apply 1 Application topically as needed (rash/irritation).   oxybutynin 5 MG tablet Commonly known as: DITROPAN Take 1 tablet (5 mg total) by mouth every 8 (eight) hours as needed for bladder spasms.   oxyCODONE-acetaminophen 5-325 MG tablet Commonly known as: Percocet Take 1 tablet by mouth every 6 (six) hours as needed for severe pain.   Mounjaro 7.5 MG/0.5ML Pen Generic drug: tirzepatide Inject 7.5 mg into the skin once a week.   tirzepatide 10 MG/0.5ML Pen Commonly known as: MOUNJARO Inject 10 mg into the skin once a week.   Mounjaro 5 MG/0.5ML Pen Generic drug: tirzepatide INJECT 5 MG SUBCUTANEOUSLY WEEKLY           Review of Systems   LIPIDS: Treated with atorvastatin 20 mg LDL 145 at baseline  LDL is 101 and she reportedly has taken her atorvastatin regularly  Triglycerides are slightly better However as above glucoses not well-controlled  Lab Results  Component Value Date   CHOL 175 07/04/2022   CHOL 172  04/14/2022   CHOL 180 01/10/2022   Lab Results  Component Value Date   HDL 34.40 (L) 07/04/2022   HDL 39.10 04/14/2022   HDL 36.60 (L) 01/10/2022   Lab Results  Component Value Date   LDLCALC 101 (H) 07/04/2022   LDLCALC 104 (H) 10/14/2021   LDLCALC 70 08/02/2020   Lab Results  Component Value Date   TRIG 197.0 (H) 07/04/2022   TRIG 205.0 (H) 04/14/2022   TRIG 226.0 (H) 01/10/2022   Lab Results  Component Value Date   CHOLHDL 5 07/04/2022   CHOLHDL 4 04/14/2022   CHOLHDL 5 01/10/2022   Lab Results  Component Value Date   LDLDIRECT 104.0 04/14/2022   LDLDIRECT 113.0 01/10/2022     Treated with lisinopril 30 mg for history of hypertension This is followed by her PCP   BP Readings from Last 3 Encounters:  07/09/22 126/78  04/17/22 136/70  01/21/22 118/76      Examination:    BP 126/78 (BP Location: Left Arm, Patient Position: Sitting, Cuff Size: Large)   Pulse 84   Ht 5\' 6"  (1.676 m)   Wt (!) 316 lb (143.3 kg)   SpO2 96%   BMI 51.00 kg/m     Assessment:  HYPOTHYROIDISM, diagnosed in 2014 also and with no typical symptoms at baseline  Her levothyroxine dose is 200 mcg daily  Subjectively doing fairly well TSH may be higher from her missing her levothyroxine on the week of her labs   DIABETES with morbid obesity:  Her A1c is 8.2  She is on Metformin 1500 mg a day and Mounjaro 7.5 mg weekly She has lost a little weight with Mounjaro but can still do better with diet Also not doing any formal exercise Although she has lost a little weight this is still insufficient  She says her insurance will not cover bariatric surgery  LIPIDS: Slightly better with Lipitor 20 mg daily, may  not have taken it consistently last week   PLAN:    She will try Mounjaro 10 mg from the next prescription Continue metformin unchanged She needs to cut back on high carbohydrate meals and pasta as well as snacks and sweets Also she does need to start a walking  program or other exercise Start Amaryl 2 mg daily at dinnertime to help with fasting glucose  Continue atorvastatin 20 mg daily, cut back on high saturated fat foods  Recheck TSH on next visit  Follow-up in 3 months  There are no Patient Instructions on file for this visit.     Reather Littler 07/09/2022, 10:02 AM    Note: This office note was prepared with Dragon voice recognition system technology. Any transcriptional errors that result from this process are unintentional.

## 2022-07-14 ENCOUNTER — Encounter: Payer: BC Managed Care – PPO | Admitting: Physical Therapy

## 2022-07-15 NOTE — Therapy (Signed)
OUTPATIENT PHYSICAL THERAPY TREATMENT NOTE   Patient Name: Joan Walker MRN: [id number] DOB:1975-12-26, 47 y.o., female Today's Date: 07/16/2022  PCP: Irven Coe, MD REFERRING PROVIDER: Julio Sicks, MD   END OF SESSION:   PT End of Session - 07/16/22 1149     Visit Number 4    Number of Visits 9    Date for PT Re-Evaluation 08/18/22    Authorization Type [employer]    PT Start Time 1145    PT Stop Time 1225    PT Time Calculation (min) 40 min    Activity Tolerance Patient tolerated treatment well    Behavior During Therapy WFL for tasks assessed/performed              Past Medical History:  Diagnosis Date   Arthritis    Asthma    DM2 (diabetes mellitus, type 2) (HCC)    GERD (gastroesophageal reflux disease)    History of kidney stones    Hypercholesteremia    Hypothyroidism    Morbid obesity (HCC)    Past Surgical History:  Procedure Laterality Date   CHOLECYSTECTOMY     CYSTOSCOPY W/ URETERAL STENT PLACEMENT Left 12/19/2021   Procedure: CYSTOSCOPY WITH POSSIBLE LEFT RETROGRADE PYELOGRAM/URETERAL  AND   STENT PLACEMENT;  Surgeon: Bjorn Pippin, MD;  Location: [facility] OR;  Service: Urology;  Laterality: Left;   CYSTOSCOPY/URETEROSCOPY/HOLMIUM LASER/STENT PLACEMENT Left 01/16/2022   Procedure: CYSTOSCOPY LEF TURETEROSCOPY/HOLMIUM LASER/STENT PLACEMENT;  Surgeon: Bjorn Pippin, MD;  Location: [facility] OR[facility];  Service: Urology;  Laterality: Left;   WISDOM TOOTH EXTRACTION     Patient Active Problem List   Diagnosis Date Noted   Left ureteral stone 12/19/2021   Acute UTI 12/19/2021   Lactic acidosis 12/19/2021   AKI (acute kidney injury) (HCC) 12/19/2021   Acute hyponatremia 12/19/2021   Hypokalemia 12/19/2021   DM2 (diabetes mellitus, type 2) (HCC) 11/18/2018   Morbid obesity (HCC) 11/18/2018   Acquired hypothyroidism 06/02/2017    REFERRING DIAG: Radiculopathy, cervical region   THERAPY DIAG:  Muscle weakness (generalized)  Rationale for Evaluation and Treatment  Rehabilitation  PERTINENT HISTORY: ACDF at C5-C6 with interbody disc spacer on 04/07/2022   PRECAUTIONS: None    SUBJECTIVE:                                                                                                                                                                                     SUBJECTIVE STATEMENT:  Patient reports she is sore and her arm is exhausted.  PAIN:  Are you having pain? No   OBJECTIVE: (objective measures completed at initial evaluation unless otherwise dated) PATIENT SURVEYS:  FOTO 63% functional status  SENSATION: Seems intact   POSTURE:             Rounded shoulder and forward head posture   PALPATION: Non-tender to palpation         CERVICAL ROM:            Cervical AROM grossly [facility]   UPPER EXTREMITY ROM:   Active ROM Right eval Left eval Right  Right 07/16/2022  Shoulder flexion 90 170 105 112  Shoulder extension        Shoulder abduction 90 170 97   Shoulder adduction        Shoulder extension        Shoulder internal rotation        Shoulder external rotation WFL      Elbow flexion WFL      Elbow extension WFL      Wrist flexion        Wrist extension        Wrist ulnar deviation        Wrist radial deviation        Wrist pronation        Wrist supination         (Blank rows = not tested)   UPPER EXTREMITY MMT:   MMT Right eval Left eval Rt 06/30/22  Shoulder flexion 2 5 2   Shoulder extension       Shoulder abduction 2 5   Shoulder adduction       Shoulder extension       Shoulder internal rotation 5 5   Shoulder external rotation 5 5   Middle trapezius       Lower trapezius       Elbow flexion 5 5   Elbow extension 5 5   Wrist flexion 5 5   Wrist extension 5 5   Wrist ulnar deviation       Wrist radial deviation       Wrist pronation       Wrist supination       Grip strength        (Blank rows = not tested)   FUNCTIONAL TESTS:  Not assessed     TODAY'S TREATMENT:    [facility] Adult PT  Treatment:                                                DATE: 07/16/2022 Therapeutic Exercise: UBE L1 x 4 min (fwd/bwd) while taking subjective  Supine dowel shoulder flexion with 1# x 10 Supine shoulder protraction with 2# 2 x 10 Supine shoulder circles with 2# cw/ccw 2 x 10 each Supine shoulder flexion with 2# 2 x 10 Supine horizontal abduction with green 2 x 10 Sidelying shoulder abduction with 2# 2 x 10 Sidelying shoulder ER with 2# 2 x 10 Sidelying shoulder flexion 2 x 10 Wall walk with physioball 2 x 10   [facility] Adult PT Treatment:                                                DATE: 07/09/22 Therapeutic Exercise: UBE Level 1 2 minutes each way  Red band row 10 x 2  Red band ext 10 x 2  Supine dowel shoulder flexion  2 x 10 Supine right shoulder flexion AROM x 10 Sidelying shoulder abduction 2 x 10 Sidelying shoulder flexion 2 x 8  Sidelying shoulder ER 10 x 2  Supine protraction 10 x 2   [facility] Adult PT Treatment:                                                DATE: 06/30/2022 Therapeutic Exercise: UBE L1 x 4 min (fwd/bwd) while taking subjective  Supine dowel shoulder flexion 2 x 10 Supine shoulder circles with 2# 2 x 10 cw/ccw Supine horizontal abduction with red 2 x 10 Sidelying shoulder abduction 2 x 10 Row with red 2 x 10 Extension with yellow 2 x 10 Wall walk with physioball 2 x 10   PATIENT EDUCATION:  Education details: HEP update Person educated: Patient Education method: Explanation, Demonstration, Tactile cues, Verbal cues, Handout Education comprehension: verbalized understanding, returned demonstration, verbal cues required, tactile cues required, and needs further education   HOME EXERCISE PROGRAM: Access Code: [email or web address]      ASSESSMENT: CLINICAL IMPRESSION: Patient tolerated therapy well with no adverse effects. Therapy continues to focus on progressing right shoulder strength and overhead reach with good tolerance. She does demonstrate improved active  shoulder flexion and is tolerating addition of weight for supine shoulder motion. She does continue to report fatigue with exercises but denies pain with therapy. Updated HEP this visit. Patient would benefit from continued skilled PT to progress shoulder strength and function in order to improve ability to work without limitation and maximize functional ability.    OBJECTIVE IMPAIRMENTS: decreased activity tolerance, decreased ROM, decreased strength, postural dysfunction, and pain.    ACTIVITY LIMITATIONS: carrying, lifting, bathing, dressing, reach over head, and hygiene/grooming   PARTICIPATION LIMITATIONS: meal prep, cleaning, laundry, driving, shopping, and occupation   PERSONAL FACTORS: Fitness, Past/current experiences, and Time since onset of injury/illness/exacerbation are also affecting patient's functional outcome.      GOALS: Goals reviewed with patient? Yes   SHORT TERM GOALS: Target date: 07/21/2022   Patient will be I with initial HEP in order to progress with therapy. Baseline: HEP provided at eval Goal status: INITIAL   LONG TERM GOALS: Target date: 08/18/2022   Patient will be I with final HEP to maintain progress from PT. Baseline: HEP provided at eval Goal status: INITIAL   2.  Patient will report >/= 69% status on FOTO to indicate improved functional ability. Baseline: 63% functional status Goal status: INITIAL   3.  Patient will demonstrate right shoulder flexion AROM >/= 160 deg in order to be able to perform all grooming and dressing tasks Baseline: right shoulder AROM flexion 90 deg Goal status: INITIAL   4.  Patient will demonstrate right shoulder flexion strength >/= 4/5 MMT in order to improve ability to write on board at shoulder level  Baseline: right shoulder flexion strength 2/5 MMT Goal status: INITIAL     PLAN: PT FREQUENCY: 1x/week   PT DURATION: 8 weeks   PLANNED INTERVENTIONS: Therapeutic exercises, Therapeutic activity, Neuromuscular  re-education, Balance training, Gait training, Patient/Family education, Self Care, Joint mobilization, Aquatic Therapy, Dry Needling, Electrical stimulation, Cryotherapy, Moist heat, Manual therapy, and Re-evaluation   PLAN FOR NEXT SESSION: Review HEP and progress PRN, focus on right shoulder strengthening from supine-inclined-upright, shoulder AAROM progressing to AROM for flexion   Rosana Hoes, PT, DPT, LAT, ATC 07/16/22  12:28 PM Phone: [phone] Fax: [phone]

## 2022-07-16 ENCOUNTER — Encounter: Payer: Self-pay | Admitting: Physical Therapy

## 2022-07-16 ENCOUNTER — Ambulatory Visit: Payer: BC Managed Care – PPO | Admitting: Physical Therapy

## 2022-07-16 ENCOUNTER — Other Ambulatory Visit: Payer: Self-pay

## 2022-07-16 DIAGNOSIS — M6281 Muscle weakness (generalized): Secondary | ICD-10-CM | POA: Diagnosis not present

## 2022-07-16 NOTE — Patient Instructions (Signed)
Access Code: [email or web address] URL: [email or web address] Date: 07/16/2022 Prepared by: Rosana Hoes  Exercises - Supine Shoulder Flexion Extension AAROM with Dowel  - 1-2 x daily - 2 sets - 10 reps - Supine Shoulder Circles with Weight  - 1-2 x daily - 2 sets - 10 reps - Supine Shoulder Horizontal Abduction with Resistance  - 1-2 x daily - 2 sets - 10 reps - Supine Single Arm Shoulder Protraction  - 1-2 x daily - 2 sets - 10 reps - Sidelying Shoulder Flexion 15 Degrees  - 1-2 x daily - 2 sets - 10 reps - Sidelying Shoulder Abduction Palm Forward  - 1-2 x daily - 2 sets - 10 reps - Isometric Shoulder Abduction at Wall  - 1-2 x daily - 10 reps - 5 seconds hold - Isometric Shoulder Flexion at Wall  - 1-2 x daily - 10 reps - 5 seconds hold - Standing Row with Anchored Resistance  - 1-2 x daily - 2 reps - 10 hold - Standing shoulder flexion wall slides  - 1-2 x daily - 2 sets - 10 reps

## 2022-07-18 DIAGNOSIS — M792 Neuralgia and neuritis, unspecified: Secondary | ICD-10-CM | POA: Diagnosis not present

## 2022-07-25 ENCOUNTER — Ambulatory Visit: Payer: BC Managed Care – PPO | Admitting: Physical Therapy

## 2022-07-28 ENCOUNTER — Other Ambulatory Visit: Payer: Self-pay

## 2022-07-28 ENCOUNTER — Telehealth: Payer: Self-pay | Admitting: Endocrinology

## 2022-07-28 DIAGNOSIS — E1165 Type 2 diabetes mellitus with hyperglycemia: Secondary | ICD-10-CM

## 2022-07-28 MED ORDER — MOUNJARO 7.5 MG/0.5ML ~~LOC~~ SOAJ
7.5000 mg | SUBCUTANEOUS | 1 refills | Status: DC
Start: 2022-07-28 — End: 2022-11-07

## 2022-07-28 NOTE — Telephone Encounter (Signed)
Patient is requesting the updated Mounjaro 7.5 MG be sent to [facility] on [address] for a 90 day supply. Any questions please call [phone]

## 2022-08-01 ENCOUNTER — Encounter: Payer: Self-pay | Admitting: Physical Therapy

## 2022-08-01 ENCOUNTER — Ambulatory Visit: Payer: BC Managed Care – PPO | Attending: Neurosurgery | Admitting: Physical Therapy

## 2022-08-01 DIAGNOSIS — Z6841 Body Mass Index (BMI) 40.0 and over, adult: Secondary | ICD-10-CM | POA: Diagnosis not present

## 2022-08-01 DIAGNOSIS — M6281 Muscle weakness (generalized): Secondary | ICD-10-CM | POA: Diagnosis not present

## 2022-08-01 DIAGNOSIS — M25511 Pain in right shoulder: Secondary | ICD-10-CM | POA: Diagnosis not present

## 2022-08-01 NOTE — Therapy (Signed)
OUTPATIENT PHYSICAL THERAPY TREATMENT NOTE   Patient Name: Joan Walker MRN: [id number] DOB:13-Feb-1975, 47 y.o., female Today's Date: 08/01/2022  PCP: Irven Coe, MD REFERRING PROVIDER: Julio Sicks, MD   END OF SESSION:   PT End of Session - 08/01/22 0719     Visit Number 5    Number of Visits 9    Date for PT Re-Evaluation 08/18/22    Authorization Type BCBS    PT Start Time 0718    PT Stop Time 0800    PT Time Calculation (min) 42 min              Past Medical History:  Diagnosis Date   Arthritis    Asthma    DM2 (diabetes mellitus, type 2) (HCC)    GERD (gastroesophageal reflux disease)    History of kidney stones    Hypercholesteremia    Hypothyroidism    Morbid obesity (HCC)    Past Surgical History:  Procedure Laterality Date   CHOLECYSTECTOMY     CYSTOSCOPY W/ URETERAL STENT PLACEMENT Left 12/19/2021   Procedure: CYSTOSCOPY WITH POSSIBLE LEFT RETROGRADE PYELOGRAM/URETERAL  AND   STENT PLACEMENT;  Surgeon: Bjorn Pippin, MD;  Location: [facility];  Service: Urology;  Laterality: Left;   CYSTOSCOPY/URETEROSCOPY/HOLMIUM LASER/STENT PLACEMENT Left 01/16/2022   Procedure: CYSTOSCOPY LEF TURETEROSCOPY/HOLMIUM LASER/STENT PLACEMENT;  Surgeon: Bjorn Pippin, MD;  Location: [facility] ORS;  Service: Urology;  Laterality: Left;   WISDOM TOOTH EXTRACTION     Patient Active Problem List   Diagnosis Date Noted   Left ureteral stone 12/19/2021   Acute UTI 12/19/2021   Lactic acidosis 12/19/2021   AKI (acute kidney injury) (HCC) 12/19/2021   Acute hyponatremia 12/19/2021   Hypokalemia 12/19/2021   DM2 (diabetes mellitus, type 2) (HCC) 11/18/2018   Morbid obesity (HCC) 11/18/2018   Acquired hypothyroidism 06/02/2017    REFERRING DIAG: Radiculopathy, cervical region   THERAPY DIAG:  Muscle weakness (generalized)  Rationale for Evaluation and Treatment Rehabilitation  PERTINENT HISTORY: ACDF at C5-C6 with interbody disc spacer on 04/07/2022   PRECAUTIONS: None     SUBJECTIVE:                                                                                                                                                                                     SUBJECTIVE STATEMENT:  I have been swimming and the shoulder is improving. I still have trouble reaching out.   PAIN:  Are you having pain? No   OBJECTIVE: (objective measures completed at initial evaluation unless otherwise dated) PATIENT SURVEYS:  FOTO 63% functional status    SENSATION: Seems intact   POSTURE:  Rounded shoulder and forward head posture   PALPATION: Non-tender to palpation         CERVICAL ROM:            Cervical AROM grossly [facility]   UPPER EXTREMITY ROM:   Active ROM Right eval Left eval Right  Right 07/16/2022 Right 08/01/22  Shoulder flexion 90 170 105 112 130  Shoulder extension         Shoulder abduction 90 170 97  118  Shoulder adduction         Shoulder extension         Shoulder internal rotation       WFL  Shoulder external rotation WFL     Back of neck   Elbow flexion WFL       Elbow extension WFL       Wrist flexion         Wrist extension         Wrist ulnar deviation         Wrist radial deviation         Wrist pronation         Wrist supination          (Blank rows = not tested)   UPPER EXTREMITY MMT:   MMT Right eval Left eval Rt 06/30/22 Right  08/01/22  Shoulder flexion 2 5 2    Shoulder extension        Shoulder abduction 2 5    Shoulder adduction        Shoulder extension        Shoulder internal rotation 5 5    Shoulder external rotation 5 5    Middle trapezius        Lower trapezius        Elbow flexion 5 5    Elbow extension 5 5    Wrist flexion 5 5    Wrist extension 5 5    Wrist ulnar deviation        Wrist radial deviation        Wrist pronation        Wrist supination        Grip strength         (Blank rows = not tested)   FUNCTIONAL TESTS:  Not assessed     TODAY'S TREATMENT:    [facility] Adult PT  Treatment:                                                DATE: 08/01/22 Therapeutic Exercise: UBE L1  x 3 min each way Supine shoulder flexion with 2# 2 x 10 Supine 3# chest press x 10 + protraction Sidelying shoulder flexion + horiz abduction + eccentric abduction with 2# 1 x 10, 3# x 10 Supine shoulder circles 3# cw/ccw 10 x 2  Seated shoulder flexion x 8  Seated scaption x 8  Seated OH press 2# x 8   [facility] Adult PT Treatment:                                                DATE: 07/16/2022 Therapeutic Exercise: UBE L1 x 4 min (fwd/bwd) while taking subjective  Supine dowel shoulder flexion with 1# x  10 Supine shoulder protraction with 2# 2 x 10 Supine shoulder circles with 2# cw/ccw 2 x 10 each Supine shoulder flexion with 2# 2 x 10 Supine horizontal abduction with green 2 x 10 Sidelying shoulder abduction with 2# 2 x 10 Sidelying shoulder ER with 2# 2 x 10 Sidelying shoulder flexion 2 x 10 Wall walk with physioball 2 x 10   [facility] Adult PT Treatment:                                                DATE: 07/09/22 Therapeutic Exercise: UBE Level 1 2 minutes each way  Red band row 10 x 2  Red band ext 10 x 2  Supine dowel shoulder flexion 2 x 10 Supine right shoulder flexion AROM x 10 Sidelying shoulder abduction 2 x 10 Sidelying shoulder flexion 2 x 8  Sidelying shoulder ER 10 x 2  Supine protraction 10 x 2   [facility] Adult PT Treatment:                                                DATE: 06/30/2022 Therapeutic Exercise: UBE L1 x 4 min (fwd/bwd) while taking subjective  Supine dowel shoulder flexion 2 x 10 Supine shoulder circles with 2# 2 x 10 cw/ccw Supine horizontal abduction with red 2 x 10 Sidelying shoulder abduction 2 x 10 Row with red 2 x 10 Extension with yellow 2 x 10 Wall walk with physioball 2 x 10   PATIENT EDUCATION:  Education details: HEP update Person educated: Patient Education method: Explanation, Demonstration, Tactile cues, Verbal cues,  Handout Education comprehension: verbalized understanding, returned demonstration, verbal cues required, tactile cues required, and needs further education   HOME EXERCISE PROGRAM: Access Code: [email or web address]      ASSESSMENT: CLINICAL IMPRESSION: Patient tolerated therapy well with no adverse effects. Her AROM has improved. Able to progress to seated shoulder AROM with fatigue, no increased pain.   Verbally Updated HEP this visit to include seated AROM. Patient would benefit from continued skilled PT to progress shoulder strength and function in order to improve ability to work without limitation and maximize functional ability. STG#1 met.     OBJECTIVE IMPAIRMENTS: decreased activity tolerance, decreased ROM, decreased strength, postural dysfunction, and pain.    ACTIVITY LIMITATIONS: carrying, lifting, bathing, dressing, reach over head, and hygiene/grooming   PARTICIPATION LIMITATIONS: meal prep, cleaning, laundry, driving, shopping, and occupation   PERSONAL FACTORS: Fitness, Past/current experiences, and Time since onset of injury/illness/exacerbation are also affecting patient's functional outcome.      GOALS: Goals reviewed with patient? Yes   SHORT TERM GOALS: Target date: 07/21/2022   Patient will be I with initial HEP in order to progress with therapy. Baseline: HEP provided at eval Goal status: MET   LONG TERM GOALS: Target date: 08/18/2022   Patient will be I with final HEP to maintain progress from PT. Baseline: HEP provided at eval Goal status: INITIAL   2.  Patient will report >/= 69% status on FOTO to indicate improved functional ability. Baseline: 63% functional status Goal status: INITIAL   3.  Patient will demonstrate right shoulder flexion AROM >/= 160 deg in order to be able to perform all grooming and dressing tasks Baseline:  right shoulder AROM flexion 90 deg  Goal status: INITIAL   4.  Patient will demonstrate right shoulder flexion strength >/= 4/5 MMT in  order to improve ability to write on board at shoulder level  Baseline: right shoulder flexion strength 2/5 MMT Goal status: INITIAL     PLAN: PT FREQUENCY: 1x/week   PT DURATION: 8 weeks   PLANNED INTERVENTIONS: Therapeutic exercises, Therapeutic activity, Neuromuscular re-education, Balance training, Gait training, Patient/Family education, Self Care, Joint mobilization, Aquatic Therapy, Dry Needling, Electrical stimulation, Cryotherapy, Moist heat, Manual therapy, and Re-evaluation   PLAN FOR NEXT SESSION: Review HEP and progress PRN, focus on right shoulder strengthening from supine-inclined-upright, shoulder AAROM progressing to AROM for flexion (ready for upright, possibly with weight)   Jannette Spanner, PTA 08/01/22 8:06 AM Phone: [phone] Fax: [phone]

## 2022-08-04 ENCOUNTER — Other Ambulatory Visit: Payer: Self-pay

## 2022-08-04 DIAGNOSIS — E1165 Type 2 diabetes mellitus with hyperglycemia: Secondary | ICD-10-CM

## 2022-08-04 MED ORDER — METFORMIN HCL ER 500 MG PO TB24
2000.0000 mg | ORAL_TABLET | Freq: Every day | ORAL | 3 refills | Status: DC
Start: 2022-08-04 — End: 2022-11-07

## 2022-08-07 ENCOUNTER — Other Ambulatory Visit: Payer: Self-pay

## 2022-08-07 DIAGNOSIS — E1165 Type 2 diabetes mellitus with hyperglycemia: Secondary | ICD-10-CM

## 2022-08-07 MED ORDER — METFORMIN HCL 500 MG PO TABS: 1500.0000 mg | ORAL_TABLET | Freq: Every day | ORAL | 3 refills | Status: DC

## 2022-08-08 ENCOUNTER — Encounter: Payer: Self-pay | Admitting: Physical Therapy

## 2022-08-08 ENCOUNTER — Ambulatory Visit: Payer: BC Managed Care – PPO | Admitting: Physical Therapy

## 2022-08-08 DIAGNOSIS — M6281 Muscle weakness (generalized): Secondary | ICD-10-CM | POA: Diagnosis not present

## 2022-08-08 NOTE — Therapy (Signed)
OUTPATIENT PHYSICAL THERAPY TREATMENT NOTE   Patient Name: Joan Walker MRN: [id number] DOB:December 10, 1975, 47 y.o., female Today's Date: 08/08/2022  PCP: Irven Coe, MD REFERRING PROVIDER: Julio Sicks, MD   END OF SESSION:   PT End of Session - 08/08/22 0714     Visit Number 6    Number of Visits 9    Date for PT Re-Evaluation 08/18/22    Authorization Type [employer]BS    PT Start Time 0715    PT Stop Time 0800    PT Time Calculation (min) 45 min              Past Medical History:  Diagnosis Date   Arthritis    Asthma    DM2 (diabetes mellitus, type 2) (HCC)    GERD (gastroesophageal reflux disease)    History of kidney stones    Hypercholesteremia    Hypothyroidism    Morbid obesity (HCC)    Past Surgical History:  Procedure Laterality Date   CHOLECYSTECTOMY     CYSTOSCOPY W/ URETERAL STENT PLACEMENT Left 12/19/2021   Procedure: CYSTOSCOPY WITH POSSIBLE LEFT RETROGRADE PYELOGRAM/URETERAL  AND   STENT PLACEMENT;  Surgeon: Bjorn Pippin, MD;  Location: [facility] OR;  Service: Urology;  Laterality: Left;   CYSTOSCOPY/URETEROSCOPY/HOLMIUM LASER/STENT PLACEMENT Left 01/16/2022   Procedure: CYSTOSCOPY LEF TURETEROSCOPY/HOLMIUM LASER/STENT PLACEMENT;  Surgeon: Bjorn Pippin, MD;  Location: [facility] OR[facility];  Service: Urology;  Laterality: Left;   WISDOM TOOTH EXTRACTION     Patient Active Problem List   Diagnosis Date Noted   Left ureteral stone 12/19/2021   Acute UTI 12/19/2021   Lactic acidosis 12/19/2021   AKI (acute kidney injury) (HCC) 12/19/2021   Acute hyponatremia 12/19/2021   Hypokalemia 12/19/2021   DM2 (diabetes mellitus, type 2) (HCC) 11/18/2018   Morbid obesity (HCC) 11/18/2018   Acquired hypothyroidism 06/02/2017    REFERRING DIAG: Radiculopathy, cervical region   THERAPY DIAG:  Muscle weakness (generalized)  Rationale for Evaluation and Treatment Rehabilitation  PERTINENT HISTORY: ACDF at C5-C6 with interbody disc spacer on 04/07/2022   PRECAUTIONS: None     SUBJECTIVE:                                                                                                                                                                                     SUBJECTIVE STATEMENT: I do my exercises at night in bed and sometimes I fall asleep.   PAIN:  Are you having pain? No   OBJECTIVE: (objective measures completed at initial evaluation unless otherwise dated) PATIENT SURVEYS:  FOTO 63% functional status 08/05/22: system down- need status    SENSATION: Seems intact   POSTURE:  Rounded shoulder and forward head posture   PALPATION: Non-tender to palpation         CERVICAL ROM:            Cervical AROM grossly [facility]   UPPER EXTREMITY ROM:   Active ROM Right eval Left eval Right  Right 07/16/2022 Right 08/01/22 Right 08/08/22  Shoulder flexion 90 170 105 112 130 130  Shoulder extension          Shoulder abduction 90 170 97  118 125  Shoulder adduction          Shoulder extension          Shoulder internal rotation       WFL   Shoulder external rotation WFL     Back of neck  Right back of neck   Elbow flexion WFL        Elbow extension WFL        Wrist flexion          Wrist extension          Wrist ulnar deviation          Wrist radial deviation          Wrist pronation          Wrist supination           (Blank rows = not tested)   UPPER EXTREMITY MMT:   MMT Right eval Left eval Rt 06/30/22 Right  08/01/22  Shoulder flexion 2 5 2    Shoulder extension        Shoulder abduction 2 5    Shoulder adduction        Shoulder extension        Shoulder internal rotation 5 5    Shoulder external rotation 5 5    Middle trapezius        Lower trapezius        Elbow flexion 5 5    Elbow extension 5 5    Wrist flexion 5 5    Wrist extension 5 5    Wrist ulnar deviation        Wrist radial deviation        Wrist pronation        Wrist supination        Grip strength         (Blank rows = not tested)   FUNCTIONAL  TESTS:  Not assessed     TODAY'S TREATMENT:    [facility] Adult PT Treatment:                                                DATE: 08/08/22 Therapeutic Exercise: UBE L2  3 min each way Standing shoulder ext green Standing shoulder row green  Yellow band ER  Yellow band IR  Seated OH press 2# x 8 Scaption AROM seated x 8 Supine 2# long lever x 10, 3#  x 10  Supine 3# chest press x 10 + protraction, 4# x 8 Supine dowel pullovers 3# Sidelying shoulder flexion + horiz abduction + eccentric abduction with 3# 2 x 10 Sidelying shoulder ER with 3# 2 x 10 Seated shoulder flexion x 8      [facility] Adult PT Treatment:  DATE: 08/01/22 Therapeutic Exercise: UBE L1  x 3 min each way Supine shoulder flexion with 2# 2 x 10 Supine 3# chest press x 10 + protraction Sidelying shoulder flexion + horiz abduction + eccentric abduction with 2# 1 x 10, 3# x 10 Supine shoulder circles 3# cw/ccw 10 x 2  Seated shoulder flexion x 8  Seated scaption x 8  Seated OH press 2# x 8   [facility] Adult PT Treatment:                                                DATE: 07/16/2022 Therapeutic Exercise: UBE L1 x 4 min (fwd/bwd) while taking subjective  Supine dowel shoulder flexion with 1# x 10 Supine shoulder protraction with 2# 2 x 10 Supine shoulder circles with 2# cw/ccw 2 x 10 each Supine shoulder flexion with 2# 2 x 10 Supine horizontal abduction with green 2 x 10 Sidelying shoulder abduction with 2# 2 x 10 Sidelying shoulder ER with 2# 2 x 10 Sidelying shoulder flexion 2 x 10 Wall walk with physioball 2 x 10   [facility] Adult PT Treatment:                                                DATE: 07/09/22 Therapeutic Exercise: UBE Level 1 2 minutes each way  Red band row 10 x 2  Red band ext 10 x 2  Supine dowel shoulder flexion 2 x 10 Supine right shoulder flexion AROM x 10 Sidelying shoulder abduction 2 x 10 Sidelying shoulder flexion 2 x 8  Sidelying shoulder ER 10 x 2   Supine protraction 10 x 2   [facility] Adult PT Treatment:                                                DATE: 06/30/2022 Therapeutic Exercise: UBE L1 x 4 min (fwd/bwd) while taking subjective  Supine dowel shoulder flexion 2 x 10 Supine shoulder circles with 2# 2 x 10 cw/ccw Supine horizontal abduction with red 2 x 10 Sidelying shoulder abduction 2 x 10 Row with red 2 x 10 Extension with yellow 2 x 10 Wall walk with physioball 2 x 10   PATIENT EDUCATION:  Education details: HEP update Person educated: Patient Education method: Explanation, Demonstration, Tactile cues, Verbal cues, Handout Education comprehension: verbalized understanding, returned demonstration, verbal cues required, tactile cues required, and needs further education   HOME EXERCISE PROGRAM: Access Code: [email or web address]      ASSESSMENT: CLINICAL IMPRESSION: Patient tolerated therapy well with no adverse effects. Her shoulder abduction AROM has improved. Continued seated/upright shoulder AROM with fatigue after reps. Began standing shoulder rotational strength with resistance bands  and updated HEP. She continues to fatigue with therex. She has difficulty with reaching activities.   Patient would benefit from continued skilled PT to progress shoulder strength and function in order to improve ability to work without limitation and maximize functional ability. Needs FOTO status.     OBJECTIVE IMPAIRMENTS: decreased activity tolerance, decreased ROM, decreased strength, postural dysfunction, and pain.    ACTIVITY LIMITATIONS: carrying, lifting, bathing, dressing,  reach over head, and hygiene/grooming   PARTICIPATION LIMITATIONS: meal prep, cleaning, laundry, driving, shopping, and occupation   PERSONAL FACTORS: Fitness, Past/current experiences, and Time since onset of injury/illness/exacerbation are also affecting patient's functional outcome.      GOALS: Goals reviewed with patient? Yes   SHORT TERM GOALS: Target date:  07/21/2022   Patient will be I with initial HEP in order to progress with therapy. Baseline: HEP provided at eval Goal status: MET   LONG TERM GOALS: Target date: 08/18/2022   Patient will be I with final HEP to maintain progress from PT. Baseline: HEP provided at eval Goal status: INITIAL   2.  Patient will report >/= 69% status on FOTO to indicate improved functional ability. Baseline: 63% functional status 08/08/22: system down Goal status: INITIAL   3.  Patient will demonstrate right shoulder flexion AROM >/= 160 deg in order to be able to perform all grooming and dressing tasks Baseline: right shoulder AROM flexion 90 deg 08/08/22: 130 Goal status: ONGOING   4.  Patient will demonstrate right shoulder flexion strength >/= 4/5 MMT in order to improve ability to write on board at shoulder level  Baseline: right shoulder flexion strength 2/5 MMT Goal status: INITIAL     PLAN: PT FREQUENCY: 1x/week   PT DURATION: 8 weeks   PLANNED INTERVENTIONS: Therapeutic exercises, Therapeutic activity, Neuromuscular re-education, Balance training, Gait training, Patient/Family education, Self Care, Joint mobilization, Aquatic Therapy, Dry Needling, Electrical stimulation, Cryotherapy, Moist heat, Manual therapy, and Re-evaluation   PLAN FOR NEXT SESSION: Review HEP and progress PRN, focus on right shoulder strengthening from supine-inclined-upright, shoulder AAROM progressing to AROM for flexion (ready for upright, possibly with weight)   Jannette Spanner, PTA 08/08/22 8:59 AM Phone: [phone] Fax: [phone]

## 2022-08-15 ENCOUNTER — Encounter: Payer: Self-pay | Admitting: Physical Therapy

## 2022-08-15 ENCOUNTER — Ambulatory Visit: Payer: BC Managed Care – PPO | Admitting: Physical Therapy

## 2022-08-15 DIAGNOSIS — M6281 Muscle weakness (generalized): Secondary | ICD-10-CM

## 2022-08-15 NOTE — Therapy (Signed)
OUTPATIENT PHYSICAL THERAPY TREATMENT NOTE   Patient Name: Joan Walker MRN: [id number] DOB:1975-09-08, 47 y.o., female Today's Date: 08/15/2022  PCP: Irven Coe, MD REFERRING PROVIDER: Julio Sicks, MD   END OF SESSION:   PT End of Session - 08/15/22 0717     Visit Number 7    Number of Visits 9    Date for PT Re-Evaluation 08/18/22    Authorization Type BCBS    PT Start Time [phone]    PT Stop Time 0800    PT Time Calculation (min) 44 min              Past Medical History:  Diagnosis Date   Arthritis    Asthma    DM2 (diabetes mellitus, type 2) (HCC)    GERD (gastroesophageal reflux disease)    History of kidney stones    Hypercholesteremia    Hypothyroidism    Morbid obesity (HCC)    Past Surgical History:  Procedure Laterality Date   CHOLECYSTECTOMY     CYSTOSCOPY W/ URETERAL STENT PLACEMENT Left 12/19/2021   Procedure: CYSTOSCOPY WITH POSSIBLE LEFT RETROGRADE PYELOGRAM/URETERAL  AND   STENT PLACEMENT;  Surgeon: Bjorn Pippin, MD;  Location: [facility] OR;  Service: Urology;  Laterality: Left;   CYSTOSCOPY/URETEROSCOPY/HOLMIUM LASER/STENT PLACEMENT Left 01/16/2022   Procedure: CYSTOSCOPY LEF TURETEROSCOPY/HOLMIUM LASER/STENT PLACEMENT;  Surgeon: Bjorn Pippin, MD;  Location: [facility] ORS;  Service: Urology;  Laterality: Left;   WISDOM TOOTH EXTRACTION     Patient Active Problem List   Diagnosis Date Noted   Left ureteral stone 12/19/2021   Acute UTI 12/19/2021   Lactic acidosis 12/19/2021   AKI (acute kidney injury) (HCC) 12/19/2021   Acute hyponatremia 12/19/2021   Hypokalemia 12/19/2021   DM2 (diabetes mellitus, type 2) (HCC) 11/18/2018   Morbid obesity (HCC) 11/18/2018   Acquired hypothyroidism 06/02/2017    REFERRING DIAG: Radiculopathy, cervical region   THERAPY DIAG:  Muscle weakness (generalized)  Rationale for Evaluation and Treatment Rehabilitation  PERTINENT HISTORY: ACDF at C5-C6 with interbody disc spacer on 04/07/2022   PRECAUTIONS: None     SUBJECTIVE:                                                                                                                                                                                     SUBJECTIVE STATEMENT: I slacked this week on some of the exercises.   PAIN:  Are you having pain? No Just discomfort and sore   OBJECTIVE: (objective measures completed at initial evaluation unless otherwise dated) PATIENT SURVEYS:  FOTO 63% functional status 08/05/22: system down- need status 08/15/22: 59%    SENSATION: Seems intact   POSTURE:  Rounded shoulder and forward head posture   PALPATION: Non-tender to palpation         CERVICAL ROM:            Cervical AROM grossly [facility]   UPPER EXTREMITY ROM:   Active ROM Right eval Left eval Right  Right 07/16/2022 Right 08/01/22 Right 08/08/22 Right 08/15/22  Shoulder flexion 90 170 105 112 130 130 138  Shoulder extension           Shoulder abduction 90 170 97  118 125 125  Shoulder adduction           Shoulder extension           Shoulder internal rotation       WFL    Shoulder external rotation WFL     Back of neck  Right back of neck    Elbow flexion WFL         Elbow extension WFL         Wrist flexion           Wrist extension           Wrist ulnar deviation           Wrist radial deviation           Wrist pronation           Wrist supination            (Blank rows = not tested)   UPPER EXTREMITY MMT:   MMT Right eval Left eval Rt 06/30/22 Right  08/01/22 Right 08/15/22  Shoulder flexion 2 5 2   4-  Shoulder extension         Shoulder abduction 2 5   4-  Shoulder adduction         Shoulder extension         Shoulder internal rotation 5 5   4   Shoulder external rotation 5 5   4   Middle trapezius         Lower trapezius         Elbow flexion 5 5     Elbow extension 5 5     Wrist flexion 5 5     Wrist extension 5 5     Wrist ulnar deviation         Wrist radial deviation         Wrist pronation          Wrist supination         Grip strength          (Blank rows = not tested)   FUNCTIONAL TESTS:  Not assessed     TODAY'S TREATMENT:    [facility] Adult PT Treatment:                                                DATE: 08/15/22 Therapeutic Exercise: UBE L2 3 min each way  Pulleys  Wall slides shoulder flexion with x 10 Wall wash over head x 30 sec  Red band Er  x 10, Green x 10  Green IR 10 x 2  Green band Row , blue band x 10 Green Band Ext extension, Blue band x 10 Oh press 3# x 10 Writing OH on white board , 5 sec Standing shoulder abduction x 10  #1 Stanidng shoulder flexion x 10 #1 Update  HEP with resistance   [facility] Adult PT Treatment:                                                DATE: 08/08/22 Therapeutic Exercise: UBE L2  3 min each way Standing shoulder ext green Standing shoulder row green  Yellow band ER  Yellow band IR  Seated OH press 2# x 8 Scaption AROM seated x 8 Supine 2# long lever x 10, 3#  x 10  Supine 3# chest press x 10 + protraction, 4# x 8 Supine dowel pullovers 3# Sidelying shoulder flexion + horiz abduction + eccentric abduction with 3# 2 x 10 Sidelying shoulder ER with 3# 2 x 10 Seated shoulder flexion x 8      [facility] Adult PT Treatment:                                                DATE: 08/01/22 Therapeutic Exercise: UBE L1  x 3 min each way Supine shoulder flexion with 2# 2 x 10 Supine 3# chest press x 10 + protraction Sidelying shoulder flexion + horiz abduction + eccentric abduction with 2# 1 x 10, 3# x 10 Supine shoulder circles 3# cw/ccw 10 x 2  Seated shoulder flexion x 8  Seated scaption x 8  Seated OH press 2# x 8   [facility] Adult PT Treatment:                                                DATE: 07/16/2022 Therapeutic Exercise: UBE L1 x 4 min (fwd/bwd) while taking subjective  Supine dowel shoulder flexion with 1# x 10 Supine shoulder protraction with 2# 2 x 10 Supine shoulder circles with 2# cw/ccw 2 x 10 each Supine shoulder  flexion with 2# 2 x 10 Supine horizontal abduction with green 2 x 10 Sidelying shoulder abduction with 2# 2 x 10 Sidelying shoulder ER with 2# 2 x 10 Sidelying shoulder flexion 2 x 10 Wall walk with physioball 2 x 10   [facility] Adult PT Treatment:                                                DATE: 07/09/22 Therapeutic Exercise: UBE Level 1 2 minutes each way  Red band row 10 x 2  Red band ext 10 x 2  Supine dowel shoulder flexion 2 x 10 Supine right shoulder flexion AROM x 10 Sidelying shoulder abduction 2 x 10 Sidelying shoulder flexion 2 x 8  Sidelying shoulder ER 10 x 2  Supine protraction 10 x 2      PATIENT EDUCATION:  Education details: HEP update Person educated: Patient Education method: Explanation, Demonstration, Tactile cues, Verbal cues, Handout Education comprehension: verbalized understanding, returned demonstration, verbal cues required, tactile cues required, and needs further education   HOME EXERCISE PROGRAM: Access Code: [email or web address]      ASSESSMENT: CLINICAL IMPRESSION: Patient tolerated therapy well with no adverse effects. Continued seated/upright shoulder strengthening  with light weight. Began functional endurance to [facility] movements with wall wash and writing over head. She fatigues quickly. She reports non compliance with most of HEP the last week except she did perform active shoulder flexion and demonstrates improvement. Her MMT has improved. She was able to progress resistance with RTC and scapular bands. Tolerates light weight only for OH motions.   Patient continues to benefit from skilled PT to progress shoulder strength and function in order to improve ability to work without limitation and maximize functional ability.     OBJECTIVE IMPAIRMENTS: decreased activity tolerance, decreased ROM, decreased strength, postural dysfunction, and pain.    ACTIVITY LIMITATIONS: carrying, lifting, bathing, dressing, reach over head, and hygiene/grooming    PARTICIPATION LIMITATIONS: meal prep, cleaning, laundry, driving, shopping, and occupation   PERSONAL FACTORS: Fitness, Past/current experiences, and Time since onset of injury/illness/exacerbation are also affecting patient's functional outcome.      GOALS: Goals reviewed with patient? Yes   SHORT TERM GOALS: Target date: 07/21/2022   Patient will be I with initial HEP in order to progress with therapy. Baseline: HEP provided at eval Goal status: MET   LONG TERM GOALS: Target date: 08/18/2022   Patient will be I with final HEP to maintain progress from PT. Baseline: HEP provided at eval Goal status: INITIAL   2.  Patient will report >/= 69% status on FOTO to indicate improved functional ability. Baseline: 63% functional status 08/08/22: system down 08/15/22: 59% Goal status: ONGOING   3.  Patient will demonstrate right shoulder flexion AROM >/= 160 deg in order to be able to perform all grooming and dressing tasks Baseline: right shoulder AROM flexion 90 deg 08/08/22: 130 Goal status: ONGOING   4.  Patient will demonstrate right shoulder flexion strength >/= 4/5 MMT in order to improve ability to write on board at shoulder level  Baseline: right shoulder flexion strength 2/5 MMT 08/15/22: 4-/5, 4/5 Goal status: ONGOING     PLAN: PT FREQUENCY: 1x/week   PT DURATION: 8 weeks   PLANNED INTERVENTIONS: Therapeutic exercises, Therapeutic activity, Neuromuscular re-education, Balance training, Gait training, Patient/Family education, Self Care, Joint mobilization, Aquatic Therapy, Dry Needling, Electrical stimulation, Cryotherapy, Moist heat, Manual therapy, and Re-evaluation   PLAN FOR NEXT SESSION: ERO; Review HEP and progress PRN, focus on right shoulder strengthening from supine-inclined-upright, shoulder AAROM progressing to AROM for flexion (ready for upright with weight)   Jannette Spanner, PTA 08/15/22 10:13 AM Phone: [phone] Fax: [phone]

## 2022-08-19 DIAGNOSIS — Z0000 Encounter for general adult medical examination without abnormal findings: Secondary | ICD-10-CM | POA: Diagnosis not present

## 2022-08-19 DIAGNOSIS — E039 Hypothyroidism, unspecified: Secondary | ICD-10-CM | POA: Diagnosis not present

## 2022-08-19 DIAGNOSIS — N200 Calculus of kidney: Secondary | ICD-10-CM | POA: Diagnosis not present

## 2022-08-19 DIAGNOSIS — Z124 Encounter for screening for malignant neoplasm of cervix: Secondary | ICD-10-CM | POA: Diagnosis not present

## 2022-08-19 DIAGNOSIS — K219 Gastro-esophageal reflux disease without esophagitis: Secondary | ICD-10-CM | POA: Diagnosis not present

## 2022-08-19 DIAGNOSIS — E1165 Type 2 diabetes mellitus with hyperglycemia: Secondary | ICD-10-CM | POA: Diagnosis not present

## 2022-08-19 DIAGNOSIS — E7800 Pure hypercholesterolemia, unspecified: Secondary | ICD-10-CM | POA: Diagnosis not present

## 2022-08-21 ENCOUNTER — Ambulatory Visit: Payer: BC Managed Care – PPO | Admitting: Physical Therapy

## 2022-08-21 ENCOUNTER — Other Ambulatory Visit: Payer: Self-pay | Admitting: Family Medicine

## 2022-08-21 DIAGNOSIS — J029 Acute pharyngitis, unspecified: Secondary | ICD-10-CM | POA: Diagnosis not present

## 2022-08-21 DIAGNOSIS — E1165 Type 2 diabetes mellitus with hyperglycemia: Secondary | ICD-10-CM | POA: Diagnosis not present

## 2022-08-21 DIAGNOSIS — Z1231 Encounter for screening mammogram for malignant neoplasm of breast: Secondary | ICD-10-CM

## 2022-08-28 ENCOUNTER — Encounter: Payer: Self-pay | Admitting: Physical Therapy

## 2022-08-28 ENCOUNTER — Ambulatory Visit: Payer: BC Managed Care – PPO | Attending: Neurosurgery | Admitting: Physical Therapy

## 2022-08-28 ENCOUNTER — Other Ambulatory Visit: Payer: Self-pay

## 2022-08-28 DIAGNOSIS — M6281 Muscle weakness (generalized): Secondary | ICD-10-CM | POA: Diagnosis not present

## 2022-08-28 NOTE — Therapy (Signed)
OUTPATIENT PHYSICAL THERAPY TREATMENT NOTE   Patient Name: Joan Walker MRN: [id number] DOB:1975-10-06, 47 y.o., female Today's Date: 08/28/2022  PCP: Irven Coe, MD REFERRING PROVIDER: Julio Sicks, MD   END OF SESSION:   PT End of Session - 08/28/22 0751     Visit Number 8    Number of Visits 14    Date for PT Re-Evaluation 10/09/22    Authorization Type BCBS    PT Start Time 0800    PT Stop Time 0840    PT Time Calculation (min) 40 min    Activity Tolerance Patient tolerated treatment well    Behavior During Therapy WFL for tasks assessed/performed               Past Medical History:  Diagnosis Date   Arthritis    Asthma    DM2 (diabetes mellitus, type 2) (HCC)    GERD (gastroesophageal reflux disease)    History of kidney stones    Hypercholesteremia    Hypothyroidism    Morbid obesity (HCC)    Past Surgical History:  Procedure Laterality Date   CHOLECYSTECTOMY     CYSTOSCOPY W/ URETERAL STENT PLACEMENT Left 12/19/2021   Procedure: CYSTOSCOPY WITH POSSIBLE LEFT RETROGRADE PYELOGRAM/URETERAL  AND   STENT PLACEMENT;  Surgeon: Bjorn Pippin, MD;  Location: [facility] OR;  Service: Urology;  Laterality: Left;   CYSTOSCOPY/URETEROSCOPY/HOLMIUM LASER/STENT PLACEMENT Left 01/16/2022   Procedure: CYSTOSCOPY LEF TURETEROSCOPY/HOLMIUM LASER/STENT PLACEMENT;  Surgeon: Bjorn Pippin, MD;  Location: [facility];  Service: Urology;  Laterality: Left;   WISDOM TOOTH EXTRACTION     Patient Active Problem List   Diagnosis Date Noted   Left ureteral stone 12/19/2021   Acute UTI 12/19/2021   Lactic acidosis 12/19/2021   AKI (acute kidney injury) (HCC) 12/19/2021   Acute hyponatremia 12/19/2021   Hypokalemia 12/19/2021   DM2 (diabetes mellitus, type 2) (HCC) 11/18/2018   Morbid obesity (HCC) 11/18/2018   Acquired hypothyroidism 06/02/2017    REFERRING DIAG: Radiculopathy, cervical region   THERAPY DIAG:  Muscle weakness (generalized)  Rationale for Evaluation and Treatment  Rehabilitation  PERTINENT HISTORY: ACDF at C5-C6 with interbody disc spacer on 04/07/2022   PRECAUTIONS: None    SUBJECTIVE:                                                                                                                                                                                     SUBJECTIVE STATEMENT: Patient reports no pain at rest, she has been doing her exercises at home. She does feel like her shoulder is getting stronger, she almost braided her hair their other day. She can raise her shoulder much better.  PAIN:  Are you having pain? No    OBJECTIVE: (objective measures completed at initial evaluation unless otherwise dated) PATIENT SURVEYS:  FOTO 63% functional status 08/05/22: system down- need status 08/15/22: 59% 8/82024: 69% (met goal)     POSTURE:             Rounded shoulder and forward head posture   UPPER EXTREMITY ROM:   Active ROM Right eval Left eval Right  Right 07/16/2022 Right 08/01/22 Right 08/08/22 Right 08/15/22 Right 08/28/2022  Shoulder flexion 90 170 105 112 130 130 138 145  Shoulder extension            Shoulder abduction 90 170 97  118 125 125 125  Shoulder adduction            Shoulder extension            Shoulder internal rotation       WFL     Shoulder external rotation WFL     Back of neck  Right back of neck   Reach to T1  Elbow flexion WFL          Elbow extension [facility]          Wrist flexion            Wrist extension            Wrist ulnar deviation            Wrist radial deviation            Wrist pronation            Wrist supination             (Blank rows = not tested)   UPPER EXTREMITY MMT:   MMT Right eval Left eval Rt 06/30/22 Right  08/01/22 Right 08/15/22 Right 08/28/2022  Shoulder flexion 2 5 2   4- 4-  Shoulder extension          Shoulder abduction 2 5   4- 4  Shoulder adduction          Shoulder extension          Shoulder internal rotation 5 5   4 5   Shoulder external rotation 5 5   4 5    Middle trapezius          Lower trapezius          Elbow flexion 5 5      Elbow extension 5 5      Wrist flexion 5 5      Wrist extension 5 5      Wrist ulnar deviation          Wrist radial deviation          Wrist pronation          Wrist supination          Grip strength           (Blank rows = not tested)   FUNCTIONAL TESTS:  Not assessed     TODAY'S TREATMENT:    [facility] Adult PT Treatment:                                                DATE: 08/28/22 Therapeutic Exercise: UBE L2 x 4 min (fwd/bwd) while taking subjective Wall ball walk for shoulder flexion stretch x 10 Row with  FM 23# 3 x 15 Wall push-up 3 x 10 Seated overhead press holding yellow med ball 4 x 6 Seated horizontal abduction with red 2 x 15 Wall ball circles at 90 deg with yellow med ball  x 2 to fatigue Seated flexion, scaption, and abduction with blue 2 x 5 each   [facility] Adult PT Treatment:                                                DATE: 08/21/22 Therapeutic Exercise: UBE L2 3 min each way  Pulleys  Wall slides shoulder flexion with x 10 Wall wash over head x 30 sec  Red band Er  x 10, Green x 10  Green IR 10 x 2  Green band Row , blue band x 10 Green Band Ext extension, Blue band x 10 Oh press 3# x 10 Writing OH on white board , 5 sec Standing shoulder abduction x 10  #1 Stanidng shoulder flexion x 10 #1 Update HEP with resistance  [facility] Adult PT Treatment:                                                DATE: 08/15/22 Therapeutic Exercise: UBE L2 3 min each way  Pulleys  Wall slides shoulder flexion with x 10 Wall wash over head x 30 sec  Red band Er  x 10, Green x 10  Green IR 10 x 2  Green band Row , blue band x 10 Green Band Ext extension, Blue band x 10 Oh press 3# x 10 Writing OH on white board , 5 sec Standing shoulder abduction x 10  #1 Stanidng shoulder flexion x 10 #1 Update HEP with resistance  [facility] Adult PT Treatment:                                                DATE:  08/08/22 Therapeutic Exercise: UBE L2  3 min each way Standing shoulder ext green Standing shoulder row green  Yellow band ER  Yellow band IR  Seated OH press 2# x 8 Scaption AROM seated x 8 Supine 2# long lever x 10, 3#  x 10  Supine 3# chest press x 10 + protraction, 4# x 8 Supine dowel pullovers 3# Sidelying shoulder flexion + horiz abduction + eccentric abduction with 3# 2 x 10 Sidelying shoulder ER with 3# 2 x 10 Seated shoulder flexion x 8  [facility] Adult PT Treatment:                                                DATE: 08/01/22 Therapeutic Exercise: UBE L1  x 3 min each way Supine shoulder flexion with 2# 2 x 10 Supine 3# chest press x 10 + protraction Sidelying shoulder flexion + horiz abduction + eccentric abduction with 2# 1 x 10, 3# x 10 Supine shoulder circles 3# cw/ccw 10 x 2  Seated shoulder flexion x 8  Seated scaption x 8  Seated OH press 2# x 8  PATIENT EDUCATION:  Education details: POC, HEP update Person educated: Patient Education method: Explanation, Demonstration, Tactile cues, Verbal cues, Handout Education comprehension: verbalized understanding, returned demonstration, verbal cues required, tactile cues required, and needs further education   HOME EXERCISE PROGRAM: Access Code: [email or web address]      ASSESSMENT: CLINICAL IMPRESSION: Patient tolerated therapy well with no adverse effects. She has made great progress in PT and demonstrates continued improvement in her right shoulder motion and strength, as well as reporting an improvement in her functional ability. She does continue to lack full overhead motion of the right shoulder and strength deficits noted above. Therapy focuses on progressing her shoulder strength and control with good tolerance. Updated her HEP this visit to progress banded strengthening for home. Patient continues to benefit from skilled PT to progress shoulder strength and function in order to improve ability to work without limitation and  maximize functional ability, so will extend PT POC for 6 more weeks.     OBJECTIVE IMPAIRMENTS: decreased activity tolerance, decreased ROM, decreased strength, postural dysfunction, and pain.    ACTIVITY LIMITATIONS: carrying, lifting, bathing, dressing, reach over head, and hygiene/grooming   PARTICIPATION LIMITATIONS: meal prep, cleaning, laundry, driving, shopping, and occupation   PERSONAL FACTORS: Fitness, Past/current experiences, and Time since onset of injury/illness/exacerbation are also affecting patient's functional outcome.      GOALS: Goals reviewed with patient? Yes   SHORT TERM GOALS: Target date: 07/21/2022   Patient will be I with initial HEP in order to progress with therapy. Baseline: HEP provided at eval Goal status: MET   LONG TERM GOALS: Target date: 10/09/2022   Patient will be I with final HEP to maintain progress from PT. Baseline: HEP provided at eval 08/28/2022: progressing Goal status: ONGOING   2.  Patient will report >/= 69% status on FOTO to indicate improved functional ability. Baseline: 63% functional status 08/08/22: system down 08/15/22: 59% 08/28/2022: 69% Goal status: MET   3.  Patient will demonstrate right shoulder flexion AROM >/= 160 deg in order to be able to perform all grooming and dressing tasks Baseline: right shoulder AROM flexion 90 deg 08/08/22: 130 08/28/2022: see above Goal status: ONGOING   4.  Patient will demonstrate right shoulder flexion strength >/= 4/5 MMT in order to improve ability to write on board at shoulder level  Baseline: right shoulder flexion strength 2/5 MMT 08/15/22: 4-/5, 4/5 08/28/2022: see above Goal status: ONGOING     PLAN: PT FREQUENCY: 1x/week   PT DURATION: 6 weeks   PLANNED INTERVENTIONS: Therapeutic exercises, Therapeutic activity, Neuromuscular re-education, Balance training, Gait training, Patient/Family education, Self Care, Joint mobilization, Aquatic Therapy, Dry Needling, Electrical  stimulation, Cryotherapy, Moist heat, Manual therapy, and Re-evaluation   PLAN FOR NEXT SESSION: Review HEP and progress PRN, focus on right shoulder strengthening and overhead motion   Rosana Hoes, PT, DPT, LAT, ATC 08/28/22  8:48 AM Phone: [phone] Fax: [phone]

## 2022-08-28 NOTE — Patient Instructions (Signed)
Access Code: [email or web address] URL: [email or web address] Date: 08/28/2022 Prepared by: Rosana Hoes  Exercises - Standing Row with Anchored Resistance  - 3 reps - 15 hold - Shoulder External Rotation with Anchored Resistance  - 3 sets - 15 reps - Standing Shoulder Internal Rotation with Anchored Resistance  - 3 sets - 15 reps - Single Arm Scaption with Resistance  - 3 sets - 10 reps - Standing Shoulder Horizontal Abduction with Resistance  - 3 sets - 15 reps - Wall Push Up  - 3 sets - 10 reps

## 2022-09-03 ENCOUNTER — Ambulatory Visit
Admission: RE | Admit: 2022-09-03 | Discharge: 2022-09-03 | Disposition: A | Discharge status: Home / Self Care | Payer: BC Managed Care – PPO | Source: Ambulatory Visit | Attending: Family Medicine | Admitting: Family Medicine

## 2022-09-03 DIAGNOSIS — Z1231 Encounter for screening mammogram for malignant neoplasm of breast: Secondary | ICD-10-CM | POA: Diagnosis not present

## 2022-09-04 ENCOUNTER — Encounter: Payer: Self-pay | Admitting: Physical Therapy

## 2022-09-04 ENCOUNTER — Other Ambulatory Visit: Payer: Self-pay

## 2022-09-04 ENCOUNTER — Ambulatory Visit: Payer: BC Managed Care – PPO | Admitting: Physical Therapy

## 2022-09-04 DIAGNOSIS — M6281 Muscle weakness (generalized): Secondary | ICD-10-CM

## 2022-09-04 NOTE — Therapy (Signed)
OUTPATIENT PHYSICAL THERAPY TREATMENT NOTE   Patient Name: Joan Walker MRN: [id number] DOB:[birth year], 47 y.o., female Today's Date: 09/04/2022  PCP: Irven Coe, MD REFERRING PROVIDER: Julio Sicks, MD   END OF SESSION:   PT End of Session - 09/04/22 1602     Visit Number 9    Number of Visits 14    Date for PT Re-Evaluation 10/09/22    Authorization Type BCBS    PT Start Time 1600    PT Stop Time 1640    PT Time Calculation (min) 40 min    Activity Tolerance Patient tolerated treatment well    Behavior During Therapy WFL for tasks assessed/performed                Past Medical History:  Diagnosis Date   Arthritis    Asthma    DM2 (diabetes mellitus, type 2) (HCC)    GERD (gastroesophageal reflux disease)    History of kidney stones    Hypercholesteremia    Hypothyroidism    Morbid obesity (HCC)    Past Surgical History:  Procedure Laterality Date   CHOLECYSTECTOMY     CYSTOSCOPY W/ URETERAL STENT PLACEMENT Left 12/19/2021   Procedure: CYSTOSCOPY WITH POSSIBLE LEFT RETROGRADE PYELOGRAM/URETERAL  AND   STENT PLACEMENT;  Surgeon: Bjorn Pippin, MD;  Location: [facility] OR;  Service: Urology;  Laterality: Left;   CYSTOSCOPY/URETEROSCOPY/HOLMIUM LASER/STENT PLACEMENT Left 01/16/2022   Procedure: CYSTOSCOPY LEF TURETEROSCOPY/HOLMIUM LASER/STENT PLACEMENT;  Surgeon: Bjorn Pippin, MD;  Location: [facility] ORS;  Service: Urology;  Laterality: Left;   WISDOM TOOTH EXTRACTION     Patient Active Problem List   Diagnosis Date Noted   Left ureteral stone 12/19/2021   Acute UTI 12/19/2021   Lactic acidosis 12/19/2021   AKI (acute kidney injury) (HCC) 12/19/2021   Acute hyponatremia 12/19/2021   Hypokalemia 12/19/2021   DM2 (diabetes mellitus, type 2) (HCC) 11/18/2018   Morbid obesity (HCC) 11/18/2018   Acquired hypothyroidism 06/02/2017    REFERRING DIAG: Radiculopathy, cervical region   THERAPY DIAG:  Muscle weakness (generalized)  Rationale for Evaluation and Treatment  Rehabilitation  PERTINENT HISTORY: ACDF at C5-C6 with interbody disc spacer on 04/07/2022   PRECAUTIONS: None    SUBJECTIVE:                                                                                                                                                                                     SUBJECTIVE STATEMENT: Patient reports she thinks she pulled a muscle in her left shoulder pulling a heavy box. Her right shoulder is doing well.   PAIN:  Are you having pain? No    OBJECTIVE: (objective measures completed at  initial evaluation unless otherwise dated) PATIENT SURVEYS:  FOTO 63% functional status 08/05/22: system down- need status 08/15/22: 59% 8/82024: 69% (met goal)     POSTURE:             Rounded shoulder and forward head posture   UPPER EXTREMITY ROM:   Active ROM Right eval Left eval Right  Right 07/16/2022 Right 08/01/22 Right 08/08/22 Right 08/15/22 Right 08/28/2022  Shoulder flexion 90 170 105 112 130 130 138 145  Shoulder extension            Shoulder abduction 90 170 97  118 125 125 125  Shoulder adduction            Shoulder extension            Shoulder internal rotation       WFL     Shoulder external rotation WFL     Back of neck  Right back of neck   Reach to T1  Elbow flexion WFL          Elbow extension WFL          Wrist flexion            Wrist extension            Wrist ulnar deviation            Wrist radial deviation            Wrist pronation            Wrist supination             (Blank rows = not tested)   UPPER EXTREMITY MMT:   MMT Right eval Left eval Rt 06/30/22 Right  08/01/22 Right 08/15/22 Right 08/28/2022  Shoulder flexion 2 5 2   4- 4-  Shoulder extension          Shoulder abduction 2 5   4- 4  Shoulder adduction          Shoulder extension          Shoulder internal rotation 5 5   4 5   Shoulder external rotation 5 5   4 5   Middle trapezius          Lower trapezius          Elbow flexion 5 5      Elbow extension 5  5      Wrist flexion 5 5      Wrist extension 5 5      Wrist ulnar deviation          Wrist radial deviation          Wrist pronation          Wrist supination          Grip strength           (Blank rows = not tested)   FUNCTIONAL TESTS:  Not assessed     TODAY'S TREATMENT:    [facility] Adult PT Treatment:                                                DATE: 09/04/22 Therapeutic Exercise: UBE L2 x 4 min (fwd/bwd) while taking subjective Supine shoulder flexion with dowel 2# x 15, 5# x 15 Supine horizontal abduction with red 2 x 15 Standing ER / IR with black 2  x 15 each Row machine 55# 3 x 10 Seated dowel overhead press 5# 3 x 10 Lat pull down machine 35# 3 x 10 Wall push-up 3 x 10 Rhythmic stabilization at 90 deg flexion with physioball tap 3 x 30 sec each Seated flexion, scaption, and abduction with green 2 x 5 each   [facility] Adult PT Treatment:                                                DATE: 08/28/22 Therapeutic Exercise: UBE L2 x 4 min (fwd/bwd) while taking subjective Wall ball walk for shoulder flexion stretch x 10 Row with FM 23# 3 x 15 Wall push-up 3 x 10 Seated overhead press holding yellow med ball 4 x 6 Seated horizontal abduction with red 2 x 15 Wall ball circles at 90 deg with yellow med ball  x 2 to fatigue Seated flexion, scaption, and abduction with blue 2 x 5 each  [facility] Adult PT Treatment:                                                DATE: 08/21/22 Therapeutic Exercise: UBE L2 3 min each way  Pulleys  Wall slides shoulder flexion with x 10 Wall wash over head x 30 sec  Red band Er  x 10, Green x 10  Green IR 10 x 2  Green band Row , blue band x 10 Green Band Ext extension, Blue band x 10 Oh press 3# x 10 Writing OH on white board , 5 sec Standing shoulder abduction x 10  #1 Stanidng shoulder flexion x 10 #1 Update HEP with resistance  [facility] Adult PT Treatment:                                                DATE: 08/15/22 Therapeutic Exercise: UBE L2  3 min each way  Pulleys  Wall slides shoulder flexion with x 10 Wall wash over head x 30 sec  Red band Er  x 10, Green x 10  Green IR 10 x 2  Green band Row , blue band x 10 Green Band Ext extension, Blue band x 10 Oh press 3# x 10 Writing OH on white board , 5 sec Standing shoulder abduction x 10  #1 Stanidng shoulder flexion x 10 #1 Update HEP with resistance  PATIENT EDUCATION:  Education details: HEP update Person educated: Patient Education method: Explanation, Demonstration, Tactile cues, Verbal cues, Handout Education comprehension: verbalized understanding, returned demonstration, verbal cues required, tactile cues required, and needs further education   HOME EXERCISE PROGRAM: Access Code: [email or web address]      ASSESSMENT: CLINICAL IMPRESSION: Patient tolerated therapy well with no adverse effects. Therapy focused on continued shoulder strengthening and slightly regressed to avoid exacerbating new onset left shoulder pain. She was able to complete all prescribed exercises without increase in left shoulder pain. She seems to be progressing well with right shoulder strength and motion. No changes made to HEP this visit. Patient continues to benefit from skilled PT to progress shoulder strength and function in order to improve ability to  work without limitation and maximize functional ability.    OBJECTIVE IMPAIRMENTS: decreased activity tolerance, decreased ROM, decreased strength, postural dysfunction, and pain.    ACTIVITY LIMITATIONS: carrying, lifting, bathing, dressing, reach over head, and hygiene/grooming   PARTICIPATION LIMITATIONS: meal prep, cleaning, laundry, driving, shopping, and occupation   PERSONAL FACTORS: Fitness, Past/current experiences, and Time since onset of injury/illness/exacerbation are also affecting patient's functional outcome.      GOALS: Goals reviewed with patient? Yes   SHORT TERM GOALS: Target date: 07/21/2022   Patient will be I with initial  HEP in order to progress with therapy. Baseline: HEP provided at eval Goal status: MET   LONG TERM GOALS: Target date: 10/09/2022   Patient will be I with final HEP to maintain progress from PT. Baseline: HEP provided at eval 08/28/2022: progressing Goal status: ONGOING   2.  Patient will report >/= 69% status on FOTO to indicate improved functional ability. Baseline: 63% functional status 08/08/22: system down 08/15/22: 59% 08/28/2022: 69% Goal status: MET   3.  Patient will demonstrate right shoulder flexion AROM >/= 160 deg in order to be able to perform all grooming and dressing tasks Baseline: right shoulder AROM flexion 90 deg 08/08/22: 130 08/28/2022: see above Goal status: ONGOING   4.  Patient will demonstrate right shoulder flexion strength >/= 4/5 MMT in order to improve ability to write on board at shoulder level  Baseline: right shoulder flexion strength 2/5 MMT 08/15/22: 4-/5, 4/5 08/28/2022: see above Goal status: ONGOING     PLAN: PT FREQUENCY: 1x/week   PT DURATION: 6 weeks   PLANNED INTERVENTIONS: Therapeutic exercises, Therapeutic activity, Neuromuscular re-education, Balance training, Gait training, Patient/Family education, Self Care, Joint mobilization, Aquatic Therapy, Dry Needling, Electrical stimulation, Cryotherapy, Moist heat, Manual therapy, and Re-evaluation   PLAN FOR NEXT SESSION: Review HEP and progress PRN, focus on right shoulder strengthening and overhead motion   Rosana Hoes, PT, DPT, LAT, ATC 09/04/22  4:42 PM Phone: [phone] Fax: [phone]

## 2022-09-05 ENCOUNTER — Other Ambulatory Visit: Payer: Self-pay | Admitting: Family Medicine

## 2022-09-05 DIAGNOSIS — R928 Other abnormal and inconclusive findings on diagnostic imaging of breast: Secondary | ICD-10-CM

## 2022-09-11 ENCOUNTER — Ambulatory Visit: Payer: BC Managed Care – PPO

## 2022-09-11 DIAGNOSIS — M6281 Muscle weakness (generalized): Secondary | ICD-10-CM

## 2022-09-11 DIAGNOSIS — M5412 Radiculopathy, cervical region: Secondary | ICD-10-CM | POA: Diagnosis not present

## 2022-09-11 NOTE — Therapy (Signed)
OUTPATIENT PHYSICAL THERAPY TREATMENT NOTE   Patient Name: Joan Walker MRN: [id number] DOB:July 16, 1975, 47 y.o., female Today's Date: 09/11/2022  PCP: Irven Coe, MD REFERRING PROVIDER: Julio Sicks, MD   END OF SESSION:   PT End of Session - 09/11/22 1429     Visit Number 10    Number of Visits 14    Date for PT Re-Evaluation 10/09/22    Authorization Type [employer]    PT Start Time 1440    PT Stop Time 1520    PT Time Calculation (min) 40 min    Activity Tolerance Patient tolerated treatment well    Behavior During Therapy WFL for tasks assessed/performed            Past Medical History:  Diagnosis Date   Arthritis    Asthma    DM2 (diabetes mellitus, type 2) (HCC)    GERD (gastroesophageal reflux disease)    History of kidney stones    Hypercholesteremia    Hypothyroidism    Morbid obesity (HCC)    Past Surgical History:  Procedure Laterality Date   CHOLECYSTECTOMY     CYSTOSCOPY W/ URETERAL STENT PLACEMENT Left 12/19/2021   Procedure: CYSTOSCOPY WITH POSSIBLE LEFT RETROGRADE PYELOGRAM/URETERAL  AND   STENT PLACEMENT;  Surgeon: Bjorn Pippin, MD;  Location: [facility] OR;  Service: Urology;  Laterality: Left;   CYSTOSCOPY/URETEROSCOPY/HOLMIUM LASER/STENT PLACEMENT Left 01/16/2022   Procedure: CYSTOSCOPY LEF TURETEROSCOPY/HOLMIUM LASER/STENT PLACEMENT;  Surgeon: Bjorn Pippin, MD;  Location: [facility] OR[facility];  Service: Urology;  Laterality: Left;   WISDOM TOOTH EXTRACTION     Patient Active Problem List   Diagnosis Date Noted   Left ureteral stone 12/19/2021   Acute UTI 12/19/2021   Lactic acidosis 12/19/2021   AKI (acute kidney injury) (HCC) 12/19/2021   Acute hyponatremia 12/19/2021   Hypokalemia 12/19/2021   DM2 (diabetes mellitus, type 2) (HCC) 11/18/2018   Morbid obesity (HCC) 11/18/2018   Acquired hypothyroidism 06/02/2017    REFERRING DIAG: Radiculopathy, cervical region   THERAPY DIAG:  Muscle weakness (generalized)  Rationale for Evaluation and Treatment  Rehabilitation  PERTINENT HISTORY: ACDF at C5-C6 with interbody disc spacer on 04/07/2022   PRECAUTIONS: None    SUBJECTIVE:                                                                                                                                                                                     SUBJECTIVE STATEMENT: Patient reports that she put up a shelf yesterday and that she had some soreness but not an increase in pain.  PAIN:  Are you having pain? No    OBJECTIVE: (objective measures completed at initial evaluation unless otherwise dated) PATIENT  SURVEYS:  FOTO 63% functional status 08/05/22: system down- need status 08/15/22: 59% 8/82024: 69% (met goal)     POSTURE:             Rounded shoulder and forward head posture   UPPER EXTREMITY ROM:   Active ROM Right eval Left eval Right  Right 07/16/2022 Right 08/01/22 Right 08/08/22 Right 08/15/22 Right 08/28/2022  Shoulder flexion 90 170 105 112 130 130 138 145  Shoulder extension            Shoulder abduction 90 170 97  118 125 125 125  Shoulder adduction            Shoulder extension            Shoulder internal rotation       WFL     Shoulder external rotation WFL     Back of neck  Right back of neck   Reach to T1  Elbow flexion WFL          Elbow extension [facility]          Wrist flexion            Wrist extension            Wrist ulnar deviation            Wrist radial deviation            Wrist pronation            Wrist supination             (Blank rows = not tested)   UPPER EXTREMITY MMT:   MMT Right eval Left eval Rt 06/30/22 Right  08/01/22 Right 08/15/22 Right 08/28/2022  Shoulder flexion 2 5 2   4- 4-  Shoulder extension          Shoulder abduction 2 5   4- 4  Shoulder adduction          Shoulder extension          Shoulder internal rotation 5 5   4 5   Shoulder external rotation 5 5   4 5   Middle trapezius          Lower trapezius          Elbow flexion 5 5      Elbow extension 5 5      Wrist  flexion 5 5      Wrist extension 5 5      Wrist ulnar deviation          Wrist radial deviation          Wrist pronation          Wrist supination          Grip strength           (Blank rows = not tested)   FUNCTIONAL TESTS:  Not assessed     TODAY'S TREATMENT:    [facility] Adult PT Treatment:                                                DATE: 09/11/22 Therapeutic Exercise: UBE L2 x 4 min (fwd/bwd) while taking subjective Lat pull down machine 35# 3 x 10 Row machine 55# 3 x 10 Standing ER / IR with black 2 x 15 each Wall push-up 3 x 10 Seated flexion, scaption,  and abduction with green 2 x 5 each Seated dowel overhead press 5# 3 x 10 Supine shoulder flexion with dowel 5# 2 x 15 Standing horizontal abduction back against wall RTB 2 x 10 Standing diagonals RTB x10 BIL   [facility] Adult PT Treatment:                                                DATE: 09/04/22 Therapeutic Exercise: UBE L2 x 4 min (fwd/bwd) while taking subjective Supine shoulder flexion with dowel 2# x 15, 5# x 15 Supine horizontal abduction with red 2 x 15 Standing ER / IR with black 2 x 15 each Row machine 55# 3 x 10 Seated dowel overhead press 5# 3 x 10 Lat pull down machine 35# 3 x 10 Wall push-up 3 x 10 Rhythmic stabilization at 90 deg flexion with physioball tap 3 x 30 sec each Seated flexion, scaption, and abduction with green 2 x 5 each   [facility] Adult PT Treatment:                                                DATE: 08/28/22 Therapeutic Exercise: UBE L2 x 4 min (fwd/bwd) while taking subjective Wall ball walk for shoulder flexion stretch x 10 Row with FM 23# 3 x 15 Wall push-up 3 x 10 Seated overhead press holding yellow med ball 4 x 6 Seated horizontal abduction with red 2 x 15 Wall ball circles at 90 deg with yellow med ball  x 2 to fatigue Seated flexion, scaption, and abduction with blue 2 x 5 each    PATIENT EDUCATION:  Education details: HEP update Person educated: Patient Education method:  Explanation, Demonstration, Tactile cues, Verbal cues, Handout Education comprehension: verbalized understanding, returned demonstration, verbal cues required, tactile cues required, and needs further education   HOME EXERCISE PROGRAM: Access Code: [email or web address]      ASSESSMENT: CLINICAL IMPRESSION: Patient presents to PT reporting continued improvements in her shoulder function, stating she was able to put up some shelving yesterday requiring overhead motions. Patient was able to tolerate all prescribed exercises with no adverse effects.  Patient is interested in possible DC next session.    OBJECTIVE IMPAIRMENTS: decreased activity tolerance, decreased ROM, decreased strength, postural dysfunction, and pain.    ACTIVITY LIMITATIONS: carrying, lifting, bathing, dressing, reach over head, and hygiene/grooming   PARTICIPATION LIMITATIONS: meal prep, cleaning, laundry, driving, shopping, and occupation   PERSONAL FACTORS: Fitness, Past/current experiences, and Time since onset of injury/illness/exacerbation are also affecting patient's functional outcome.      GOALS: Goals reviewed with patient? Yes   SHORT TERM GOALS: Target date: 07/21/2022   Patient will be I with initial HEP in order to progress with therapy. Baseline: HEP provided at eval Goal status: MET   LONG TERM GOALS: Target date: 10/09/2022   Patient will be I with final HEP to maintain progress from PT. Baseline: HEP provided at eval 08/28/2022: progressing Goal status: ONGOING   2.  Patient will report >/= 69% status on FOTO to indicate improved functional ability. Baseline: 63% functional status 08/08/22: system down 08/15/22: 59% 08/28/2022: 69% Goal status: MET   3.  Patient will demonstrate right shoulder flexion AROM >/= 160 deg in order  to be able to perform all grooming and dressing tasks Baseline: right shoulder AROM flexion 90 deg 08/08/22: 130 08/28/2022: see above Goal status: ONGOING   4.  Patient will  demonstrate right shoulder flexion strength >/= 4/5 MMT in order to improve ability to write on board at shoulder level  Baseline: right shoulder flexion strength 2/5 MMT 08/15/22: 4-/5, 4/5 08/28/2022: see above Goal status: ONGOING     PLAN: PT FREQUENCY: 1x/week   PT DURATION: 6 weeks   PLANNED INTERVENTIONS: Therapeutic exercises, Therapeutic activity, Neuromuscular re-education, Balance training, Gait training, Patient/Family education, Self Care, Joint mobilization, Aquatic Therapy, Dry Needling, Electrical stimulation, Cryotherapy, Moist heat, Manual therapy, and Re-evaluation   PLAN FOR NEXT SESSION: Review HEP and progress PRN, focus on right shoulder strengthening and overhead motion   Berta Minor PTA 09/11/22  3:17 PM Phone: [phone] Fax: [phone]

## 2022-09-25 ENCOUNTER — Ambulatory Visit: Payer: BC Managed Care – PPO | Attending: Neurosurgery | Admitting: Physical Therapy

## 2022-09-25 ENCOUNTER — Encounter: Payer: Self-pay | Admitting: Physical Therapy

## 2022-09-25 ENCOUNTER — Other Ambulatory Visit: Payer: Self-pay

## 2022-09-25 DIAGNOSIS — D72829 Elevated white blood cell count, unspecified: Secondary | ICD-10-CM | POA: Diagnosis not present

## 2022-09-25 DIAGNOSIS — M6281 Muscle weakness (generalized): Secondary | ICD-10-CM | POA: Insufficient documentation

## 2022-09-25 NOTE — Therapy (Signed)
OUTPATIENT PHYSICAL THERAPY TREATMENT NOTE  DISCHARGE   Patient Name: Joan Walker MRN: [id number] DOB:Apr 04, 1975, 47 y.o., female Today's Date: 09/25/2022  PCP: Irven Coe, MD REFERRING PROVIDER: Julio Sicks, MD   END OF SESSION:   PT End of Session - 09/25/22 1612     Visit Number 11    Number of Visits 14    Date for PT Re-Evaluation 10/09/22    Authorization Type BC[employer]    PT Start Time 1615    PT Stop Time 1653    PT Time Calculation (min) 38 min    Activity Tolerance Patient tolerated treatment well    Behavior During Therapy WFL for tasks assessed/performed             Past Medical History:  Diagnosis Date   Arthritis    Asthma    DM2 (diabetes mellitus, type 2) (HCC)    GERD (gastroesophageal reflux disease)    History of kidney stones    Hypercholesteremia    Hypothyroidism    Morbid obesity (HCC)    Past Surgical History:  Procedure Laterality Date   CHOLECYSTECTOMY     CYSTOSCOPY W/ URETERAL STENT PLACEMENT Left 12/19/2021   Procedure: CYSTOSCOPY WITH POSSIBLE LEFT RETROGRADE PYELOGRAM/URETERAL  AND   STENT PLACEMENT;  Surgeon: Bjorn Pippin, MD;  Location: [facility] OR;  Service: Urology;  Laterality: Left;   CYSTOSCOPY/URETEROSCOPY/HOLMIUM LASER/STENT PLACEMENT Left 01/16/2022   Procedure: CYSTOSCOPY LEF TURETEROSCOPY/HOLMIUM LASER/STENT PLACEMENT;  Surgeon: Bjorn Pippin, MD;  Location: [facility] ORS;  Service: Urology;  Laterality: Left;   WISDOM TOOTH EXTRACTION     Patient Active Problem List   Diagnosis Date Noted   Left ureteral stone 12/19/2021   Acute UTI 12/19/2021   Lactic acidosis 12/19/2021   AKI (acute kidney injury) (HCC) 12/19/2021   Acute hyponatremia 12/19/2021   Hypokalemia 12/19/2021   DM2 (diabetes mellitus, type 2) (HCC) 11/18/2018   Morbid obesity (HCC) 11/18/2018   Acquired hypothyroidism 06/02/2017    REFERRING DIAG: Radiculopathy, cervical region   THERAPY DIAG:  Muscle weakness (generalized)  Rationale for Evaluation and  Treatment Rehabilitation  PERTINENT HISTORY: ACDF at C5-C6 with interbody disc spacer on 04/07/2022   PRECAUTIONS: None    SUBJECTIVE:                                                                                                                                                                                     SUBJECTIVE STATEMENT: Patient reports her shoulder has been good. She was able to write a lot on her board while at school, starting at the top of the board.  PAIN:  Are you having pain? No    OBJECTIVE: (objective  measures completed at initial evaluation unless otherwise dated) PATIENT SURVEYS:  FOTO 63% functional status 08/05/22: system down- need status 08/15/22: 59% 8/82024: 69% (met goal)     POSTURE:             Rounded shoulder and forward head posture   UPPER EXTREMITY ROM:   Active ROM Right eval Left eval Right  Right 07/16/2022 Right 08/01/22 Right 08/08/22 Right 08/15/22 Right 08/28/2022 Right 09/25/2022  Shoulder flexion 90 170 105 112 130 130 138 145 160  Shoulder extension             Shoulder abduction 90 170 97  118 125 125 125   Shoulder adduction             Shoulder extension             Shoulder internal rotation       WFL      Shoulder external rotation WFL     Back of neck  Right back of neck   Reach to T1   Elbow flexion WFL           Elbow extension [facility]           Wrist flexion             Wrist extension             Wrist ulnar deviation             Wrist radial deviation             Wrist pronation             Wrist supination              (Blank rows = not tested)   UPPER EXTREMITY MMT:   MMT Right eval Left eval Rt 06/30/22 Right  08/01/22 Right 08/15/22 Right 08/28/2022 Right 09/25/2022  Shoulder flexion 2 5 2   4- 4- 4+  Shoulder extension           Shoulder abduction 2 5   4- 4 4+  Shoulder adduction           Shoulder extension           Shoulder internal rotation 5 5   4 5    Shoulder external rotation 5 5   4 5    Middle  trapezius           Lower trapezius           Elbow flexion 5 5       Elbow extension 5 5       Wrist flexion 5 5       Wrist extension 5 5       Wrist ulnar deviation           Wrist radial deviation           Wrist pronation           Wrist supination           Grip strength            (Blank rows = not tested)   FUNCTIONAL TESTS:  Not assessed     TODAY'S TREATMENT:    [facility] Adult PT Treatment:  DATE: 09/25/22 Therapeutic Exercise: UBE L1 x 4 min (fwd/bwd) while taking subjective Wall push-up 3 x 10 Seated overhead press with 5# 3 x 10 Row with FM 10# x 10, 17# 2 x 10 Extension with FM 30# 3 x 10 Standing ER / IR with black 3 x 15 each Wall ball circles at 90 deg flexion 2 x 20 cw/ccw  Seated flexion, scaption, and abduction with blue 3 x 5 each Seated horizontal abduction with blue 3 x 15   [facility] Adult PT Treatment:                                                DATE: 09/11/22 Therapeutic Exercise: UBE L2 x 4 min (fwd/bwd) while taking subjective Lat pull down machine 35# 3 x 10 Row machine 55# 3 x 10 Standing ER / IR with black 2 x 15 each Wall push-up 3 x 10 Seated flexion, scaption, and abduction with green 2 x 5 each Seated dowel overhead press 5# 3 x 10 Supine shoulder flexion with dowel 5# 2 x 15 Standing horizontal abduction back against wall RTB 2 x 10 Standing diagonals RTB x10 BIL  [facility] Adult PT Treatment:                                                DATE: 09/04/22 Therapeutic Exercise: UBE L2 x 4 min (fwd/bwd) while taking subjective Supine shoulder flexion with dowel 2# x 15, 5# x 15 Supine horizontal abduction with red 2 x 15 Standing ER / IR with black 2 x 15 each Row machine 55# 3 x 10 Seated dowel overhead press 5# 3 x 10 Lat pull down machine 35# 3 x 10 Wall push-up 3 x 10 Rhythmic stabilization at 90 deg flexion with physioball tap 3 x 30 sec each Seated flexion, scaption, and abduction with green 2  x 5 each  [facility] Adult PT Treatment:                                                DATE: 08/28/22 Therapeutic Exercise: UBE L2 x 4 min (fwd/bwd) while taking subjective Wall ball walk for shoulder flexion stretch x 10 Row with FM 23# 3 x 15 Wall push-up 3 x 10 Seated overhead press holding yellow med ball 4 x 6 Seated horizontal abduction with red 2 x 15 Wall ball circles at 90 deg with yellow med ball  x 2 to fatigue Seated flexion, scaption, and abduction with blue 2 x 5 each  PATIENT EDUCATION:  Education details: POC discharge, HEP Person educated: Patient Education method: Explanation Education comprehension: Verbalized understanding   HOME EXERCISE PROGRAM: Access Code: [email or web address]      ASSESSMENT: CLINICAL IMPRESSION: Patient tolerated therapy well with no adverse effects. She demonstrates great improvement in therapy with her right shoulder range of motion, strength, and functional ability. She is independent with her HEP so will be formally discharged from PT as she has achieved all her established goals.    OBJECTIVE IMPAIRMENTS: decreased activity tolerance, decreased ROM, decreased strength, postural dysfunction, and pain.    ACTIVITY  LIMITATIONS: carrying, lifting, bathing, dressing, reach over head, and hygiene/grooming   PARTICIPATION LIMITATIONS: meal prep, cleaning, laundry, driving, shopping, and occupation   PERSONAL FACTORS: Fitness, Past/current experiences, and Time since onset of injury/illness/exacerbation are also affecting patient's functional outcome.      GOALS: Goals reviewed with patient? Yes   SHORT TERM GOALS: Target date: 07/21/2022   Patient will be I with initial HEP in order to progress with therapy. Baseline: HEP provided at eval Goal status: MET   LONG TERM GOALS: Target date: 10/09/2022   Patient will be I with final HEP to maintain progress from PT. Baseline: HEP provided at eval 08/28/2022: progressing 09/25/2022: independent Goal  status: MET   2.  Patient will report >/= 69% status on FOTO to indicate improved functional ability. Baseline: 63% functional status 08/08/22: system down 08/15/22: 59% 08/28/2022: 69% Goal status: MET   3.  Patient will demonstrate right shoulder flexion AROM >/= 160 deg in order to be able to perform all grooming and dressing tasks Baseline: right shoulder AROM flexion 90 deg 08/08/22: 130 08/28/2022: see above 09/25/2022: see above Goal status: MET   4.  Patient will demonstrate right shoulder flexion strength >/= 4/5 MMT in order to improve ability to write on board at shoulder level  Baseline: right shoulder flexion strength 2/5 MMT 08/15/22: 4-/5, 4/5 08/28/2022: see above 09/25/2022: see above Goal status: MET     PLAN: PT FREQUENCY: 1x/week   PT DURATION: 6 weeks   PLANNED INTERVENTIONS: Therapeutic exercises, Therapeutic activity, Neuromuscular re-education, Balance training, Gait training, Patient/Family education, Self Care, Joint mobilization, Aquatic Therapy, Dry Needling, Electrical stimulation, Cryotherapy, Moist heat, Manual therapy, and Re-evaluation   PLAN FOR NEXT SESSION: NA - discharge   Rosana Hoes, PT, DPT, LAT, ATC 09/25/22  4:54 PM Phone: [phone] Fax: [phone]    PHYSICAL THERAPY DISCHARGE SUMMARY  Visits from Start of Care: 11  Current functional level related to goals / functional outcomes: See above   Remaining deficits: See above   Education / Equipment: HEP   Patient agrees to discharge. Patient goals were met. Patient is being discharged due to meeting the stated rehab goals.

## 2022-10-28 ENCOUNTER — Other Ambulatory Visit: Payer: Self-pay | Admitting: Gastroenterology

## 2022-11-07 ENCOUNTER — Other Ambulatory Visit: Payer: Self-pay | Admitting: Family Medicine

## 2022-11-07 ENCOUNTER — Other Ambulatory Visit: Payer: Self-pay

## 2022-11-07 ENCOUNTER — Ambulatory Visit
Admission: RE | Admit: 2022-11-07 | Discharge: 2022-11-07 | Disposition: A | Discharge status: Home / Self Care | Payer: BC Managed Care – PPO | Source: Ambulatory Visit | Attending: Family Medicine | Admitting: Family Medicine

## 2022-11-07 ENCOUNTER — Telehealth: Payer: Self-pay | Admitting: Endocrinology

## 2022-11-07 DIAGNOSIS — E1165 Type 2 diabetes mellitus with hyperglycemia: Secondary | ICD-10-CM

## 2022-11-07 DIAGNOSIS — R928 Other abnormal and inconclusive findings on diagnostic imaging of breast: Secondary | ICD-10-CM

## 2022-11-07 DIAGNOSIS — D72829 Elevated white blood cell count, unspecified: Secondary | ICD-10-CM | POA: Diagnosis not present

## 2022-11-07 DIAGNOSIS — N6489 Other specified disorders of breast: Secondary | ICD-10-CM

## 2022-11-07 MED ORDER — GLIMEPIRIDE 2 MG PO TABS: 2.0000 mg | ORAL_TABLET | Freq: Every day | ORAL | 3 refills | Status: DC

## 2022-11-07 MED ORDER — LEVOTHYROXINE SODIUM 200 MCG PO TABS
ORAL_TABLET | ORAL | 1 refills | Status: DC
Start: 2022-11-07 — End: 2023-01-20

## 2022-11-07 MED ORDER — METFORMIN HCL 500 MG PO TABS
1500.0000 mg | ORAL_TABLET | Freq: Every day | ORAL | 3 refills | Status: DC
Start: 2022-11-07 — End: 2023-01-20

## 2022-11-07 MED ORDER — MOUNJARO 7.5 MG/0.5ML ~~LOC~~ SOAJ: 7.5000 mg | SUBCUTANEOUS | 1 refills | Status: DC

## 2022-11-07 NOTE — Telephone Encounter (Signed)
Patient advising she is out of Glimepride 2mg , Mounjoro 7.5, Levothyroxine 200Mg  and call into [facility].  Notify patient when done.

## 2022-11-07 NOTE — Telephone Encounter (Signed)
I sent prescription for Moiunjaro 7.5 mg weekly her current dose for now, I will make changes after I see her.

## 2022-11-18 ENCOUNTER — Encounter (HOSPITAL_COMMUNITY): Payer: Self-pay | Admitting: Gastroenterology

## 2022-11-24 NOTE — Anesthesia Preprocedure Evaluation (Signed)
Anesthesia Evaluation  Patient identified by MRN, date of birth, ID band Patient awake    Reviewed: Allergy & Precautions, NPO status , Patient's Chart, lab work & pertinent test results  Airway Mallampati: III  TM Distance: <3 FB Neck ROM: Full    Dental no notable dental hx. (+) Teeth Intact, Dental Advisory Given   Pulmonary asthma , former smoker   Pulmonary exam normal        Cardiovascular Normal cardiovascular exam     Neuro/Psych negative neurological ROS  negative psych ROS   GI/Hepatic Neg liver ROS,GERD  ,,  Endo/Other  diabetes, Type 2, Oral Hypoglycemic AgentsHypothyroidism  Morbid obesityhyponatremia  Renal/GU Renal disease     Musculoskeletal  (+) Arthritis , Osteoarthritis,    Abdominal  (+) + obese  Peds  Hematology  (+) Blood dyscrasia, anemia   Anesthesia Other Findings   Reproductive/Obstetrics Hcg negative                             Anesthesia Physical Anesthesia Plan  ASA: 3  Anesthesia Plan: MAC   Post-op Pain Management: Minimal or no pain anticipated   Induction: Intravenous  PONV Risk Score and Plan: 3 and Treatment may vary due to age or medical condition and Propofol infusion  Airway Management Planned: Natural Airway and Simple Face Mask  Additional Equipment: None  Intra-op Plan:   Post-operative Plan: Extubation in OR  Informed Consent: I have reviewed the patients History and Physical, chart, labs and discussed the procedure including the risks, benefits and alternatives for the proposed anesthesia with the patient or authorized representative who has indicated his/her understanding and acceptance.     Dental advisory given  Plan Discussed with: CRNA and Anesthesiologist  Anesthesia Plan Comments:        Anesthesia Quick Evaluation

## 2022-11-24 NOTE — H&P (Signed)
History of Present Illness    General:  Patient is a 47 year old female, referred to our office by her primary care physician Irven Coe, or blood in her stools.  Patient states that she began noticing scant bright red blood in the stools a few weeks ago after she used toilet paper.  She also noticed a scant amount of blood that had dropped into the commode.  She states that these vessels are occasional and she last saw episode a week ago.  She also states that she has been experiencing loose soft bowel movement since she developed kidney stones.  Patient states that she did have a colonoscopy in her late teens while living in [place] but she is unsure why she had the procedure.  She she is also not sure of the physician who performed a colonoscopy nor hospital it was.  Reports occasional bouts of nausea which she relates to since beginning [facility] for weight loss management and diabetes.  She denies vomiting, difficulty swallowing, cough, early satiety, and unintentional weight loss.  Denies pain with bowel movement, change in diameter bowel movements, abdominal pain.  Her mother recent has a history of colon polyps. Paternal grandmother had lung cancer. Maternal uncle had prostate cancer.  Morbid obesity. She is being followed by Endocrinology, Dr. Leatha Gilding. Hgb A1c stable @ 8.1%, with possible plans for referral for bariatric surgery.   She has a medical history of thyroid disease, diabetes, which she is following up with endocrinology, hypertension, and acid reflux disease.  Current Medications Acetaminophen 500 MG Tablet 2 tablets as needed for fever Orally every 6 hrs , Notes to Pharmacist: Listed in Epic Albuterol Sulfate HFA 108 (90 Base) MCG/ACT Aerosol Solution 2 puffs as needed Inhalation every 4 hrs , Notes to Pharmacist: as needed Atorvastatin Calcium 10 MG Tablet 1 tablet in the evening Orally Once a day for cholesterol Fluticasone Propionate 50 MCG/ACT Suspension 1 spray in each nostril  Nasally Once a day , Notes to Pharmacist: Listed in Epic Levothyroxine Sodium 300 MCG Tablet 1 tablets on an empty stomach in the morning Orally Once a day Mon-Fri Lisinopril 20 MG Tablet 1 tablet for blood pressure Orally Once a day Meloxicam 10 MG Capsule 1 capsule Orally Once a day as needed metFORMIN HCl 500 MG Tablet 1 tablet with a meal Orally Once a day in the am, 2 tablests in the pm Mounjaro(Tirzepatide) 7.5 MG/0.5ML Solution Pen-injector one injection Subcutaneous once a week Nystatin 100000 UNIT/GM Powder 1 application Externally Twice a day , Notes to Pharmacist: Listed in Epic oxyBUTYnin Chloride 5 MG Tablet 1 tablet as needed for bladder spasms Orally every 8 hours ZyrTEC Allergy 10 MG Tablet 1 tablet Orally Once a day as needed Discontinued Carboxymethylcellul-Glycerin 0.5-0.9 % Solution as directed Ophthalmic , Notes to Pharmacist: Listed in Epic Gabapentin 300 MG Capsule 1 capsule Orally at bedtime Gabapentin 100 MG Capsule 1 capsule Orally twice daily in the morning and at lunch Tums(Calcium Carbonate Antacid) 500 MG Tablet Chewable 1 tablet as needed for indigestion, heartburn Orally twice a day Medication List reviewed and reconciled with the patient  Past Medical History Hypothyroidism - Dr. Lucianne Muss. Hypercholesterolemia. Diabetes - Dr. Lucianne Muss. Microalbuminuria.  Surgical History Lap Chole 2003 cystoscopy with insertion of left double J stent. 2023 laser lithotripsy with stone extraction and replacement of the double J stent. 12/2021 anterior cervical diskectomy and fusion at C5-C6 03/2022 Family History Father: alive, diagnosed with Diabetes Mother: alive, polyp Paternal Grand Father: deceased Paternal Grand Mother: deceased, diagnosed  with Lung Cancer Maternal Grand Father: deceased Maternal Grand Mother: deceased Maternal uncle: alive, diagnosed with Prostate CA Only child No Family History of Colon Cancer, Liver Disease .  Social History    General:   Tobacco use  cigarettes:  Former smoker, Quit in year  2006, Tobacco history last updated  06/30/2022, Vaping  No. Alcohol: yes, rare. Caffeine: rare. Recreational drug use: no. DIET: limited portions some. Exercise: generally active, walks 10 minutes some days, gym 2 times per week. Marital Status: married. Children: 3. OCCUPATION: day care. Seat belt use: yes.  Allergies Bee Sting: swelling - Allergy Hospitalization/Major Diagnostic Procedure Childbirth/ Surgery Anterior diskectomy 03/2022   Vital Signs Wt: 316.6, Wt change: -14.4 lbs, Ht: 66, BMI: 51.09, Temp: 97.2, Pulse sitting: 76, BP sitting: 103/74. Examination    General Examination: CONSTITUTIONAL:  alert, oriented, NAD.     Gastroenterology:: GENERAL APPEARANCE:  Obese, well developed, well nourished, no active distress, pleasant, no acute distress .  CARDIOVASCULAR  Normal RRR w/o murmurs or gallops. No peripheral edema.  RESPIRATORY  Breath sounds normal. Respiration even and unlabored.  ABDOMEN  Obese, no masses palpated. Liver and spleen not palpated, normal. Bowel sounds normal, Abdomen distended.  NEURO:  normal strength and reflexes, cranial nerves II-XII grossly intact, normal gait.  PSYCH:  mood/affect normal.   Assessments 1. Colon cancer screening - Z12.11 (Primary) 2. GERD (gastroesophageal reflux disease) - K21.9 3. Blood in stool - K92.1 4. Morbid obesity due to excess calories - E66.01 Treatment 1. Colon cancer screening      IMAGING: Colonoscopy Notes: Francene Castle 06/30/2022 09:10:32 AM > Will call pt with hosp schedule due to BMI     Notes: Discussed importance of diet modifications the week prior to procedure.      Clinical Notes: Discussed benefits and risks of colonoscopy.    2. GERD (gastroesophageal reflux disease)

## 2022-11-25 ENCOUNTER — Ambulatory Visit (HOSPITAL_COMMUNITY): Payer: BC Managed Care – PPO | Admitting: Anesthesiology

## 2022-11-25 ENCOUNTER — Encounter (HOSPITAL_COMMUNITY)
Admission: RE | Disposition: A | Discharge status: Home / Self Care | Payer: Self-pay | Source: Home / Self Care | Attending: Gastroenterology

## 2022-11-25 ENCOUNTER — Other Ambulatory Visit: Payer: Self-pay

## 2022-11-25 ENCOUNTER — Encounter (HOSPITAL_COMMUNITY): Payer: Self-pay | Admitting: Gastroenterology

## 2022-11-25 ENCOUNTER — Ambulatory Visit (HOSPITAL_COMMUNITY): Payer: Self-pay | Admitting: Anesthesiology

## 2022-11-25 ENCOUNTER — Ambulatory Visit (HOSPITAL_COMMUNITY)
Admission: RE | Admit: 2022-11-25 | Discharge: 2022-11-25 | Disposition: A | Discharge status: Home / Self Care | Payer: BC Managed Care – PPO | Attending: Gastroenterology | Admitting: Gastroenterology

## 2022-11-25 DIAGNOSIS — K648 Other hemorrhoids: Secondary | ICD-10-CM | POA: Diagnosis not present

## 2022-11-25 DIAGNOSIS — Z7985 Long-term (current) use of injectable non-insulin antidiabetic drugs: Secondary | ICD-10-CM | POA: Diagnosis not present

## 2022-11-25 DIAGNOSIS — Z6841 Body Mass Index (BMI) 40.0 and over, adult: Secondary | ICD-10-CM | POA: Insufficient documentation

## 2022-11-25 DIAGNOSIS — Z7984 Long term (current) use of oral hypoglycemic drugs: Secondary | ICD-10-CM | POA: Insufficient documentation

## 2022-11-25 DIAGNOSIS — Z83719 Family history of colon polyps, unspecified: Secondary | ICD-10-CM | POA: Insufficient documentation

## 2022-11-25 DIAGNOSIS — Z87891 Personal history of nicotine dependence: Secondary | ICD-10-CM | POA: Insufficient documentation

## 2022-11-25 DIAGNOSIS — M1990 Unspecified osteoarthritis, unspecified site: Secondary | ICD-10-CM | POA: Insufficient documentation

## 2022-11-25 DIAGNOSIS — E119 Type 2 diabetes mellitus without complications: Secondary | ICD-10-CM | POA: Diagnosis not present

## 2022-11-25 DIAGNOSIS — Z1211 Encounter for screening for malignant neoplasm of colon: Secondary | ICD-10-CM | POA: Diagnosis not present

## 2022-11-25 DIAGNOSIS — K644 Residual hemorrhoidal skin tags: Secondary | ICD-10-CM | POA: Diagnosis not present

## 2022-11-25 DIAGNOSIS — J45909 Unspecified asthma, uncomplicated: Secondary | ICD-10-CM | POA: Diagnosis not present

## 2022-11-25 DIAGNOSIS — J449 Chronic obstructive pulmonary disease, unspecified: Secondary | ICD-10-CM | POA: Diagnosis not present

## 2022-11-25 DIAGNOSIS — I10 Essential (primary) hypertension: Secondary | ICD-10-CM | POA: Diagnosis not present

## 2022-11-25 DIAGNOSIS — K621 Rectal polyp: Secondary | ICD-10-CM | POA: Insufficient documentation

## 2022-11-25 DIAGNOSIS — E039 Hypothyroidism, unspecified: Secondary | ICD-10-CM | POA: Diagnosis not present

## 2022-11-25 DIAGNOSIS — K921 Melena: Secondary | ICD-10-CM | POA: Diagnosis not present

## 2022-11-25 DIAGNOSIS — K219 Gastro-esophageal reflux disease without esophagitis: Secondary | ICD-10-CM | POA: Insufficient documentation

## 2022-11-25 DIAGNOSIS — K635 Polyp of colon: Secondary | ICD-10-CM | POA: Insufficient documentation

## 2022-11-25 HISTORY — PX: POLYPECTOMY: SHX5525

## 2022-11-25 HISTORY — PX: COLONOSCOPY WITH PROPOFOL: SHX5780

## 2022-11-25 LAB — GLUCOSE, CAPILLARY: Glucose-Capillary: 150 mg/dL — ABNORMAL HIGH (ref 70–99)

## 2022-11-25 SURGERY — COLONOSCOPY WITH PROPOFOL
Anesthesia: Monitor Anesthesia Care

## 2022-11-25 MED ORDER — MEPERIDINE HCL 50 MG/ML IJ SOLN: 6.2500 mg | INTRAMUSCULAR | Status: DC | PRN

## 2022-11-25 MED ORDER — PROPOFOL 500 MG/50ML IV EMUL
INTRAVENOUS | Status: DC | PRN
  Administered 2022-11-25: 155 ug/kg/min via INTRAVENOUS

## 2022-11-25 MED ORDER — FENTANYL CITRATE (PF) 100 MCG/2ML IJ SOLN: 25.0000 ug | INTRAMUSCULAR | Status: DC | PRN

## 2022-11-25 MED ORDER — OXYCODONE HCL 5 MG PO TABS: 5.0000 mg | ORAL_TABLET | Freq: Once | ORAL | Status: DC | PRN

## 2022-11-25 MED ORDER — ACETAMINOPHEN 325 MG PO TABS
325.0000 mg | ORAL_TABLET | ORAL | Status: DC | PRN
  Filled 2022-11-25: qty 2

## 2022-11-25 MED ORDER — ONDANSETRON HCL 4 MG/2ML IJ SOLN: 4.0000 mg | Freq: Once | INTRAMUSCULAR | Status: DC | PRN

## 2022-11-25 MED ORDER — SODIUM CHLORIDE 0.9 % IV SOLN: INTRAVENOUS | Status: DC

## 2022-11-25 MED ORDER — ACETAMINOPHEN 160 MG/5ML PO SOLN
325.0000 mg | ORAL | Status: DC | PRN
  Filled 2022-11-25: qty 20.3

## 2022-11-25 MED ORDER — OXYCODONE HCL 5 MG/5ML PO SOLN
5.0000 mg | Freq: Once | ORAL | Status: DC | PRN
  Filled 2022-11-25: qty 5

## 2022-11-25 SURGICAL SUPPLY — 22 items
ELECT REM PT RETURN 9FT ADLT (ELECTROSURGICAL)
ELECTRODE REM PT RTRN 9FT ADLT (ELECTROSURGICAL) IMPLANT
FCP BXJMBJMB 240X2.8X (CUTTING FORCEPS)
FLOOR PAD 36X40 (MISCELLANEOUS) ×2
FORCEPS BIOP RAD 4 LRG CAP 4 (CUTTING FORCEPS) IMPLANT
FORCEPS BIOP RJ4 240 W/NDL (CUTTING FORCEPS)
FORCEPS BXJMBJMB 240X2.8X (CUTTING FORCEPS) IMPLANT
INJECTOR/SNARE I SNARE (MISCELLANEOUS) IMPLANT
LUBRICANT JELLY 4.5OZ STERILE (MISCELLANEOUS) IMPLANT
MANIFOLD NEPTUNE II (INSTRUMENTS) IMPLANT
NDL SCLEROTHERAPY 25GX240 (NEEDLE) IMPLANT
NEEDLE SCLEROTHERAPY 25GX240 (NEEDLE)
PAD FLOOR 36X40 (MISCELLANEOUS) ×3 IMPLANT
PROBE APC STR FIRE (PROBE) IMPLANT
PROBE INJECTION GOLD (MISCELLANEOUS)
PROBE INJECTION GOLD 7FR (MISCELLANEOUS) IMPLANT
SNARE ROTATE MED OVAL 20MM (MISCELLANEOUS) IMPLANT
SYR 50ML LL SCALE MARK (SYRINGE) IMPLANT
TRAP SPECIMEN MUCOUS 40CC (MISCELLANEOUS) IMPLANT
TUBING ENDO SMARTCAP PENTAX (MISCELLANEOUS) IMPLANT
TUBING IRRIGATION ENDOGATOR (MISCELLANEOUS) ×3 IMPLANT
WATER STERILE IRR 1000ML POUR (IV SOLUTION) IMPLANT

## 2022-11-25 NOTE — Interval H&P Note (Signed)
History and Physical Interval Note: 47/female for screening colonoscopy with propofol, she also has history of blood in her stool.  11/25/2022 7:55 AM  Joan Walker  has presented today for colonoscopy, with the diagnosis of Colon cancer screening.  The various methods of treatment have been discussed with the patient and family. After consideration of risks, benefits and other options for treatment, the patient has consented to  Procedure(s): COLONOSCOPY WITH PROPOFOL (N/A) as a surgical intervention.  The patient's history has been reviewed, patient examined, no change in status, stable for surgery.  I have reviewed the patient's chart and labs.  Questions were answered to the patient's satisfaction.     Kerin Salen

## 2022-11-25 NOTE — Transfer of Care (Signed)
Immediate Anesthesia Transfer of Care Note  Patient: Joan Walker  Procedure(s) Performed: Procedure(s): COLONOSCOPY WITH PROPOFOL (N/A) POLYPECTOMY  Patient Location: Endoscopy Unit  Anesthesia Type:MAC  Level of Consciousness:  sedated, patient cooperative and responds to stimulation  Airway & Oxygen Therapy:Patient Spontanous Breathing and Patient connected to face mask oxgen  Post-op Assessment:  Report given to PACU RN and Post -op Vital signs reviewed and stable  Post vital signs:  Reviewed and stable  Last Vitals:  Vitals:   11/25/22 0746  BP: (!) 140/71  Pulse: 76  Resp: 20  Temp: (!) 36.4 C  SpO2: 98%    Complications: No apparent anesthesia complications

## 2022-11-25 NOTE — Anesthesia Postprocedure Evaluation (Signed)
Anesthesia Post Note  Patient: RIELLY BRUNN  Procedure(s) Performed: COLONOSCOPY WITH PROPOFOL POLYPECTOMY     Patient location during evaluation: PACU Anesthesia Type: MAC Level of consciousness: awake and alert Pain management: pain level controlled Vital Signs Assessment: post-procedure vital signs reviewed and stable Respiratory status: spontaneous breathing, nonlabored ventilation, respiratory function stable and patient connected to nasal cannula oxygen Cardiovascular status: stable and blood pressure returned to baseline Postop Assessment: no apparent nausea or vomiting Anesthetic complications: no   No notable events documented.  Last Vitals:  Vitals:   11/25/22 0923 11/25/22 0933  BP: 95/73 137/75  Pulse: 71 69  Resp: 20 17  Temp:    SpO2: 100% 100%    Last Pain:  Vitals:   11/25/22 0933  TempSrc:   PainSc: 0-No pain                 Gabryelle Whitmoyer

## 2022-11-25 NOTE — Discharge Instructions (Signed)

## 2022-11-25 NOTE — Op Note (Signed)
[facility] Patient Name: Joan Walker Procedure Date: 11/25/2022 MRN: [id number] Attending MD: Kerin Salen , MD, [id number] Date of Birth: [birth year] CSN: [id number] Age: 47 Admit Type: Outpatient Procedure:                Colonoscopy Indications:              Screening for colorectal malignant neoplasm,                            occasional blood in stool Providers:                Kerin Salen, MD, Marge Duncans, RN, Rozetta Nunnery, Technician Referring MD:             Irven Coe, MD Medicines:                Monitored Anesthesia Care Complications:            No immediate complications. Estimated Blood Loss:     Estimated blood loss was minimal. Procedure:                Pre-Anesthesia Assessment:                           - Prior to the procedure, a History and Physical                            was performed, and patient medications and                            allergies were reviewed. The patient's tolerance of                            previous anesthesia was also reviewed. The risks                            and benefits of the procedure and the sedation                            options and risks were discussed with the patient.                            All questions were answered, and informed consent                            was obtained. Prior Anticoagulants: The patient has                            taken no anticoagulant or antiplatelet agents. ASA                            Grade Assessment: III - A patient with severe  systemic disease. After reviewing the risks and                            benefits, the patient was deemed in satisfactory                            condition to undergo the procedure.                           After obtaining informed consent, the colonoscope                            was passed under direct vision. Throughout the                            procedure,  the patient's blood pressure, pulse, and                            oxygen saturations were monitored continuously. The                            [id number]-HQ190L ([id number]) Olympus colonoscope was                            introduced through the anus and advanced to the the                            cecum, identified by appendiceal orifice and                            ileocecal valve. The colonoscopy was performed                            without difficulty. The patient tolerated the                            procedure well. The quality of the bowel                            preparation was good. The ileocecal valve,                            appendiceal orifice, and rectum were photographed. Scope In: 8:50:31 AM Scope Out: 9:08:22 AM Scope Withdrawal Time: 0 hours 11 minutes 58 seconds  Total Procedure Duration: 0 hours 17 minutes 51 seconds  Findings:      Hemorrhoids were found on perianal exam.      Skin tags were found on perianal exam.      Two sessile polyps were found in the rectum and sigmoid colon. The       polyps were 4 mm in size. These polyps were removed with a cold biopsy       forceps. Resection and retrieval were complete.      Internal hemorrhoids were found during retroflexion.      The exam was otherwise without abnormality. Impression:               -  Hemorrhoids found on perianal exam.                           - Perianal skin tags found on perianal exam.                           - Two 4 mm polyps in the rectum and in the sigmoid                            colon, removed with a cold biopsy forceps. Resected                            and retrieved.                           - Internal hemorrhoids.                           - The examination was otherwise normal. Moderate Sedation:      Patient did not receive moderate sedation for this procedure, but       instead received monitored anesthesia care. Recommendation:           - Patient has a contact number  available for                            emergencies. The signs and symptoms of potential                            delayed complications were discussed with the                            patient. Return to normal activities tomorrow.                            Written discharge instructions were provided to the                            patient.                           - High fiber diet.                           - Continue present medications.                           - Await pathology results.                           - Repeat colonoscopy for surveillance based on                            pathology results. Procedure Code(s):        --- Professional ---  65784, Colonoscopy, flexible; with biopsy, single                            or multiple Diagnosis Code(s):        --- Professional ---                           Z12.11, Encounter for screening for malignant                            neoplasm of colon                           D12.8, Benign neoplasm of rectum                           D12.5, Benign neoplasm of sigmoid colon                           K64.8, Other hemorrhoids                           K64.4, Residual hemorrhoidal skin tags CPT copyright 2022 American [employer]. All rights reserved. The codes documented in this report are preliminary and upon coder review may  be revised to meet current compliance requirements. Kerin Salen, MD 11/25/2022 9:13:42 AM This report has been signed electronically. Number of Addenda: 0

## 2022-11-26 LAB — SURGICAL PATHOLOGY

## 2022-11-27 ENCOUNTER — Encounter (HOSPITAL_COMMUNITY): Payer: Self-pay | Admitting: Gastroenterology

## 2022-12-12 DIAGNOSIS — E1169 Type 2 diabetes mellitus with other specified complication: Secondary | ICD-10-CM | POA: Diagnosis not present

## 2022-12-12 DIAGNOSIS — J309 Allergic rhinitis, unspecified: Secondary | ICD-10-CM | POA: Diagnosis not present

## 2022-12-12 DIAGNOSIS — J019 Acute sinusitis, unspecified: Secondary | ICD-10-CM | POA: Diagnosis not present

## 2023-01-12 DIAGNOSIS — R3121 Asymptomatic microscopic hematuria: Secondary | ICD-10-CM | POA: Diagnosis not present

## 2023-01-16 ENCOUNTER — Other Ambulatory Visit: Payer: BC Managed Care – PPO

## 2023-01-16 ENCOUNTER — Other Ambulatory Visit: Payer: Self-pay

## 2023-01-16 DIAGNOSIS — E039 Hypothyroidism, unspecified: Secondary | ICD-10-CM

## 2023-01-16 DIAGNOSIS — E1165 Type 2 diabetes mellitus with hyperglycemia: Secondary | ICD-10-CM

## 2023-01-16 DIAGNOSIS — E782 Mixed hyperlipidemia: Secondary | ICD-10-CM

## 2023-01-17 LAB — BASIC METABOLIC PANEL
BUN: 11 mg/dL (ref 7–25)
CO2: 26 mmol/L (ref 20–32)
Calcium: 8.8 mg/dL (ref 8.6–10.2)
Chloride: 102 mmol/L (ref 98–110)
Creat: 0.66 mg/dL (ref 0.50–0.99)
Glucose, Bld: 156 mg/dL — ABNORMAL HIGH (ref 65–99)
Potassium: 4.8 mmol/L (ref 3.5–5.3)
Sodium: 138 mmol/L (ref 135–146)

## 2023-01-17 LAB — LIPID PANEL
Cholesterol: 159 mg/dL (ref <200)
HDL: 42 mg/dL — ABNORMAL LOW (ref >=50)
LDL Cholesterol (Calc): 90 mg/dL
Non-HDL Cholesterol (Calc): 117 mg/dL (ref <130)
Total CHOL/HDL Ratio: 3.8 (calc) (ref <5.0)
Triglycerides: 174 mg/dL — ABNORMAL HIGH (ref <150)

## 2023-01-17 LAB — TSH: TSH: 9 m[IU]/L — ABNORMAL HIGH

## 2023-01-17 LAB — HEMOGLOBIN A1C
Hgb A1c MFr Bld: 7.7 %{Hb} — ABNORMAL HIGH (ref <5.7)
Mean Plasma Glucose: 174 mg/dL
eAG (mmol/L): 9.7 mmol/L

## 2023-01-17 LAB — T4, FREE: Free T4: 1.2 ng/dL (ref 0.8–1.8)

## 2023-01-19 DIAGNOSIS — B961 Klebsiella pneumoniae [K. pneumoniae] as the cause of diseases classified elsewhere: Secondary | ICD-10-CM | POA: Diagnosis not present

## 2023-01-19 DIAGNOSIS — R3121 Asymptomatic microscopic hematuria: Secondary | ICD-10-CM | POA: Diagnosis not present

## 2023-01-19 DIAGNOSIS — N390 Urinary tract infection, site not specified: Secondary | ICD-10-CM | POA: Diagnosis not present

## 2023-01-19 DIAGNOSIS — N200 Calculus of kidney: Secondary | ICD-10-CM | POA: Diagnosis not present

## 2023-01-20 ENCOUNTER — Ambulatory Visit (INDEPENDENT_AMBULATORY_CARE_PROVIDER_SITE_OTHER): Payer: BC Managed Care – PPO | Admitting: Endocrinology

## 2023-01-20 ENCOUNTER — Encounter: Payer: Self-pay | Admitting: Endocrinology

## 2023-01-20 VITALS — BP 132/80 | HR 88 | Resp 20 | Ht 66.0 in | Wt 330.2 lb

## 2023-01-20 DIAGNOSIS — E1165 Type 2 diabetes mellitus with hyperglycemia: Secondary | ICD-10-CM | POA: Diagnosis not present

## 2023-01-20 DIAGNOSIS — Z7984 Long term (current) use of oral hypoglycemic drugs: Secondary | ICD-10-CM

## 2023-01-20 DIAGNOSIS — Z7985 Long-term (current) use of injectable non-insulin antidiabetic drugs: Secondary | ICD-10-CM

## 2023-01-20 DIAGNOSIS — M25552 Pain in left hip: Secondary | ICD-10-CM | POA: Diagnosis not present

## 2023-01-20 DIAGNOSIS — E039 Hypothyroidism, unspecified: Secondary | ICD-10-CM | POA: Diagnosis not present

## 2023-01-20 MED ORDER — LEVOTHYROXINE SODIUM 125 MCG PO TABS: 250.0000 ug | ORAL_TABLET | Freq: Every day | ORAL | 4 refills | Status: DC

## 2023-01-20 MED ORDER — TIRZEPATIDE 10 MG/0.5ML ~~LOC~~ SOAJ: 10.0000 mg | SUBCUTANEOUS | 4 refills | Status: DC

## 2023-01-20 MED ORDER — METFORMIN HCL 500 MG PO TABS: 1000.0000 mg | ORAL_TABLET | Freq: Two times a day (BID) | ORAL | 3 refills | Status: DC

## 2023-01-20 NOTE — Patient Instructions (Signed)
Increase mounjaro to 10 mg weekly.  Increase levothyroxine to 250 mcg daily.  Rest medications same.

## 2023-01-20 NOTE — Addendum Note (Signed)
 Addended by: [place] Fant, [place] on: 01/20/2023 03:10 PM   Modules accepted: Orders

## 2023-01-20 NOTE — Progress Notes (Addendum)
 Outpatient Endocrinology Note Joan Fernando, MD   Patient's Name: Joan Walker    DOB: 16-Jan-1976    MRN: [id number]                                                    REASON OF VISIT: Follow up for type 2 diabetes mellitus /hypothyroidism  PCP: Leonel Cole, MD  HISTORY OF PRESENT ILLNESS:   Joan Walker is a 47 y.o. old female with past medical history listed below, is here for follow up for type 2 diabetes mellitus / hypothyroidism.   Pertinent Diabetes History: Patient was previously seen by Dr. Von and was last time seen in June 2024.  Patient was diagnosed with type 2 diabetes mellitus in July 2020.  Baseline A1c was 10.4%.  She has uncontrolled type 2 diabetes mellitus.  Chronic Diabetes Complications : Retinopathy: yes. Last ophthalmology exam was done on annually, 01/19/2023, following with ophthalmology regularly.  No records available. Nephropathy: no, on ACE/ARB /lisinopril . Peripheral neuropathy: no Coronary artery disease: no Stroke: no  Relevant comorbidities and cardiovascular risk factors: Obesity: yes Body mass index is 53.3 kg/m.  Hypertension: Yes  Hyperlipidemia : Yes, on statin   Current / Home Diabetic regimen includes:  Metformin  1000 mg 2 times a day. Mounjaro  7.5 mg weekly. Glimepiride  2 mg daily.  Prior diabetic medications:  Glycemic data:   She has test supplies and glucometer however has not been checking blood sugar at home, no glucose data to review.  Hypoglycemia: Patient has no hypoglycemic episodes. Patient has hypoglycemia awareness.  Factors modifying glucose control: 1.  Diabetic diet assessment: 3 meals a day.  2.  Staying active or exercising: No formal exercise.  3.  Medication compliance: compliant all of the time.  # Primary hypothyroidism: - First diagnosed around 2015.  Levothyroxine  dose was periodically adjusted over time.  She has been up to 300 mcg levothyroxine  in the past.  Lately she has been taking  levothyroxine  200 mcg daily.  Interval history  Diabetes regimen as noted above.  She reports occasional nausea with Mounjaro  however it has been persistently about the same since the start of Mounjaro  on the lower dose.  Recent hemoglobin A1c improved to 7.7%.  Reports had diabetic eye exam yesterday with positive for diabetic retinopathy.  Denies numbness and tingling of the feet.  Recent lab results reviewed stable renal function, cholesterol level acceptable with LDL 90.  She has been taking levothyroxine  200 mcg daily, taking in the morning 20 to 30 minutes before breakfast and reports compliance.  Denies hypo or hyperthyroid symptoms.  Recent TSH elevated to 9 and free T4 normal of 1.2.  She has morbid obesity with BMI 53.  She was referred to bariatric surgery in March, did not have visit as her medical insurance did not cover at that time.  She reports now she has new medical insurance and will cover bariatric surgery as well, she likes to be referred to bariatric surgery.  REVIEW OF SYSTEMS As per history of present illness.   PAST MEDICAL HISTORY: Past Medical History:  Diagnosis Date   Arthritis    Asthma    DM2 (diabetes mellitus, type 2) (HCC)    GERD (gastroesophageal reflux disease)    History of kidney stones    Hypercholesteremia    Hypothyroidism  Morbid obesity (HCC)     PAST SURGICAL HISTORY: Past Surgical History:  Procedure Laterality Date   CHOLECYSTECTOMY     COLONOSCOPY WITH PROPOFOL  N/A 11/25/2022   Procedure: COLONOSCOPY WITH PROPOFOL ;  Surgeon: Saintclair Jasper, MD;  Location: [facility];  Service: Gastroenterology;  Laterality: N/A;   CYSTOSCOPY W/ URETERAL STENT PLACEMENT Left 12/19/2021   Procedure: CYSTOSCOPY WITH POSSIBLE LEFT RETROGRADE PYELOGRAM/URETERAL  AND   STENT PLACEMENT;  Surgeon: Watt Rush, MD;  Location: [facility] OR;  Service: Urology;  Laterality: Left;   CYSTOSCOPY/URETEROSCOPY/HOLMIUM LASER/STENT PLACEMENT Left 01/16/2022   Procedure:  CYSTOSCOPY LEF TURETEROSCOPY/HOLMIUM LASER/STENT PLACEMENT;  Surgeon: Watt Rush, MD;  Location: [facility];  Service: Urology;  Laterality: Left;   POLYPECTOMY  11/25/2022   Procedure: POLYPECTOMY;  Surgeon: Saintclair Jasper, MD;  Location: [facility];  Service: Gastroenterology;;   WISDOM TOOTH EXTRACTION      ALLERGIES: Allergies  Allergen Reactions   Bee Pollen Swelling    FAMILY HISTORY:  Family History  Problem Relation Age of Onset   Bladder Cancer Mother        63s   Prostate cancer Maternal Uncle    Lung cancer Paternal Aunt    Lung cancer Paternal Grandmother     SOCIAL HISTORY: Social History   Socioeconomic History   Marital status: Married    Spouse name: Not on file   Number of children: Not on file   Years of education: Not on file   Highest education level: Not on file  Occupational History   Not on file  Tobacco Use   Smoking status: Former    Current packs/day: 0.00    Types: Cigarettes    Quit date: 01/20/2004    Years since quitting: 19.0   Smokeless tobacco: Never  Vaping Use   Vaping status: Never Used  Substance and Sexual Activity   Alcohol use: No   Drug use: No   Sexual activity: Not Currently  Other Topics Concern   Not on file  Social History Narrative   Not on file   Social Drivers of Health   Financial Resource Strain: Not on file  Food Insecurity: Not on file  Transportation Needs: Not on file  Physical Activity: Not on file  Stress: Not on file  Social Connections: Not on file    MEDICATIONS:  Current Outpatient Medications  Medication Sig Dispense Refill   acetaminophen  (TYLENOL ) 500 MG tablet Take 1,000 mg by mouth every 6 (six) hours as needed for fever or moderate pain.     albuterol  (VENTOLIN  HFA) 108 (90 Base) MCG/ACT inhaler Inhale 2 puffs into the lungs as needed for wheezing or shortness of breath.     atorvastatin  (LIPITOR) 20 MG tablet Take 1 tablet (20 mg total) by mouth daily. 90 tablet 3   calcium  carbonate  (TUMS - DOSED IN MG ELEMENTAL CALCIUM ) 500 MG chewable tablet Chew 1 tablet (200 mg of elemental calcium  total) by mouth 2 (two) times daily as needed for indigestion or heartburn.     CARBOXYMETHYLCELLUL-GLYCERIN OP Place 1 drop into both eyes as needed (dry eyes).     fluticasone (FLONASE) 50 MCG/ACT nasal spray Place 1 spray into both nostrils as needed for allergies.     glimepiride  (AMARYL ) 2 MG tablet Take 1 tablet (2 mg total) by mouth daily before supper. 90 tablet 3   lisinopril  (ZESTRIL ) 20 MG tablet Take 20 mg by mouth every evening.     magnesium  oxide (MAG-OX) 400 (240 Mg) MG tablet Take 400  mg by mouth at bedtime.     MOUNJARO  7.5 MG/0.5ML Pen Inject 7.5 mg into the skin once a week. 6 mL 1   nystatin  (MYCOSTATIN /NYSTOP ) powder Apply 1 Application topically as needed (rash/irritation).     oxybutynin  (DITROPAN ) 5 MG tablet Take 1 tablet (5 mg total) by mouth every 8 (eight) hours as needed for bladder spasms. 30 tablet 1   tirzepatide  (MOUNJARO ) 5 MG/0.5ML Pen INJECT 5 MG SUBCUTANEOUSLY WEEKLY 2 mL 0   levothyroxine  (SYNTHROID ) 125 MCG tablet Take 2 tablets (250 mcg total) by mouth daily before breakfast. 1 TABLET BEFORE BREAKFAST DAILY EXCEPT HALF TABLET ON SUNDAYS 180 tablet 4   metFORMIN  (GLUCOPHAGE ) 500 MG tablet Take 2 tablets (1,000 mg total) by mouth 2 (two) times daily with a meal. 360 tablet 3   tirzepatide  (MOUNJARO ) 10 MG/0.5ML Pen Inject 10 mg into the skin once a week. 6 mL 4   No current facility-administered medications for this visit.    PHYSICAL EXAM: Vitals:   01/20/23 0812  BP: 132/80  Pulse: 88  Resp: 20  SpO2: 98%  Weight: (!) 330 lb 3.2 oz (149.8 kg)  Height: 5' 6 (1.676 m)   Body mass index is 53.3 kg/m.  Wt Readings from Last 3 Encounters:  01/20/23 (!) 330 lb 3.2 oz (149.8 kg)  11/25/22 (!) 315 lb 14.7 oz (143.3 kg)  07/09/22 (!) 316 lb (143.3 kg)    General: Well developed, well nourished female in no apparent distress.  HEENT: AT/[place], no  external lesions.  Eyes: Conjunctiva clear and no icterus. Neck: Neck supple  Lungs: Respirations not labored Neurologic: Alert, oriented, normal speech Extremities / Skin: Dry. No sores or rashes noted.  Psychiatric: Does not appear depressed or anxious  Diabetic Foot Exam - Simple   Simple Foot Form Diabetic Foot exam was performed with the following findings: Yes 01/20/2023  8:46 AM  Visual Inspection No deformities, no ulcerations, no other skin breakdown bilaterally: Yes Sensation Testing Intact to touch and monofilament testing bilaterally: Yes Pulse Check See comments: Yes Comments Difficulty to palpate due to subcutaneous fat.     LABS Reviewed Lab Results  Component Value Date   HGBA1C 7.7 (H) 01/16/2023   HGBA1C 8.2 (H) 07/04/2022   HGBA1C 7.9 (H) 04/14/2022   Lab Results  Component Value Date   FRUCTOSAMINE 227 03/18/2019   FRUCTOSAMINE 250 11/12/2018   Lab Results  Component Value Date   CHOL 159 01/16/2023   HDL 42 (L) 01/16/2023   LDLCALC 90 01/16/2023   LDLDIRECT 104.0 04/14/2022   TRIG 174 (H) 01/16/2023   CHOLHDL 3.8 01/16/2023   Lab Results  Component Value Date   MICRALBCREAT 1.7 10/14/2021   MICRALBCREAT 11.7 11/12/2020   Lab Results  Component Value Date   CREATININE 0.66 01/16/2023   Lab Results  Component Value Date   GFR 89.09 07/04/2022    ASSESSMENT / PLAN  1. Uncontrolled type 2 diabetes mellitus with hyperglycemia, without long-term current use of insulin  (HCC)   2. Morbid obesity (HCC)   3. Adult hypothyroidism     Diabetes Mellitus type 2, complicated by diabetic retinopathy. - Diabetic status / severity: Uncontrolled.  Lab Results  Component Value Date   HGBA1C 7.7 (H) 01/16/2023    - Hemoglobin A1c goal : <7%  Discussed about importance of diabetes control to prevent chronic complications including diabetic retinopathy, neuropathy and nephropathy.  Adjusted diabetes regimen as follows.  - Medications: See  below.  I) increase Mounjaro  from 7.5  to 10 mg weekly. II) continue metformin  1000 mg 2 times a day. III) continue glimepiride  2 mg daily.  - Home glucose testing: Advised to check blood sugar in the morning fasting and at bedtime. - Discussed/ Gave Hypoglycemia treatment plan.  # Consult : not required at this time.   # Annual urine for microalbuminuria/ creatinine ratio, no microalbuminuria currently, continue ACE/ARB /lisinopril .  She declined to have urine sample today.  Will check in next follow-up visit. Last  Lab Results  Component Value Date   MICRALBCREAT 1.7 10/14/2021    # Foot check nightly.  # She has diabetic retinopathy, no records available, regular follow-up with ophthalmology.  Asked to send eye exam record to our clinic.   - Diet: Make healthy diabetic food choices - Life style / activity / exercise: Discussed.  2. Blood pressure  -  BP Readings from Last 1 Encounters:  01/20/23 132/80    - Control is in target.  - No change in current plans.  3. Lipid status / Hyperlipidemia - Last  Lab Results  Component Value Date   LDLCALC 90 01/16/2023   - Continue atorvastatin  20 mg daily.  # Primary hypothyroidism -Recent TSH elevated 9.0.  Increase levothyroxine  from 200 to 250 mcg daily. -Will check thyroid  function test prior to follow-up visit in 3 months.  # Morbid obesity BMI 53 -Discussed about lifestyle modification with calorie restriction, carbohydrate limitation and routine formal exercise.  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 -She is interested in bariatric surgery. -Will rerefer to bariatric surgery.  Diagnoses and all orders for this visit:  Uncontrolled type 2 diabetes mellitus with hyperglycemia, without long-term current use of insulin  (HCC) -     levothyroxine  (SYNTHROID ) 125 MCG tablet; Take 2 tablets (250 mcg total) by mouth daily before breakfast. 1 TABLET  BEFORE BREAKFAST DAILY EXCEPT HALF TABLET ON SUNDAYS -     Amb Referral to Bariatric Surgery -     Microalbumin / creatinine urine ratio -     BASIC METABOLIC PANEL WITH GFR -     Hemoglobin A1c -     metFORMIN  (GLUCOPHAGE ) 500 MG tablet; Take 2 tablets (1,000 mg total) by mouth 2 (two) times daily with a meal.  Morbid obesity (HCC) -     Amb Referral to Bariatric Surgery  Adult hypothyroidism -     T4, free -     TSH  Other orders -     tirzepatide  (MOUNJARO ) 10 MG/0.5ML Pen; Inject 10 mg into the skin once a week.    DISPOSITION Follow up in clinic in 3  months suggested.   All questions answered and patient verbalized understanding of the plan.  Eeva Schlosser, MD [facility] Endocr[facility]ology [facility] [address] [place], [address] [place], [place] [address] Phone # [phone]  At least part of this note was generated using voice recognition software. Inadvertent word errors may have occurred, which were not recognized during the proofreading process.

## 2023-02-07 ENCOUNTER — Encounter: Payer: Self-pay | Admitting: Endocrinology

## 2023-02-11 ENCOUNTER — Other Ambulatory Visit: Payer: Self-pay

## 2023-02-11 ENCOUNTER — Telehealth: Payer: Self-pay

## 2023-02-11 NOTE — Telephone Encounter (Signed)
Patient has RX from office, spoke to patient and apparently it was something different needed per patient. Nothing needed at this time from office.

## 2023-03-02 ENCOUNTER — Telehealth: Payer: Self-pay

## 2023-03-02 NOTE — Telephone Encounter (Signed)
 Patient has not heard form anyone about the surgery referral. Asking for information.

## 2023-03-11 ENCOUNTER — Telehealth: Payer: Self-pay

## 2023-03-11 ENCOUNTER — Other Ambulatory Visit (HOSPITAL_COMMUNITY): Payer: Self-pay

## 2023-03-11 NOTE — Telephone Encounter (Signed)
Pharmacy Patient Advocate Encounter   Received notification from Pt Calls Messages that prior authorization for Joan Walker is required/requested.   Insurance verification completed.   The patient is insured through [employer] [facility] .   Per test claim: PA required; PA submitted to above mentioned insurance via CoverMyMeds Key/confirmation #/EOC [id number] Status is pending

## 2023-03-11 NOTE — Telephone Encounter (Signed)
PA needed for [facility] per patient

## 2023-03-12 ENCOUNTER — Other Ambulatory Visit (HOSPITAL_COMMUNITY): Payer: Self-pay

## 2023-03-13 NOTE — Telephone Encounter (Signed)
Pharmacy Patient Advocate Encounter  Received notification from [employer] that Prior Authorization for Joan Walker has been APPROVED through 03/09/26   PA #/Case ID/Reference #: [id number]

## 2023-04-09 ENCOUNTER — Other Ambulatory Visit: Payer: Self-pay

## 2023-04-16 ENCOUNTER — Other Ambulatory Visit: Payer: BC Managed Care – PPO

## 2023-04-16 DIAGNOSIS — E039 Hypothyroidism, unspecified: Secondary | ICD-10-CM | POA: Diagnosis not present

## 2023-04-16 DIAGNOSIS — E1165 Type 2 diabetes mellitus with hyperglycemia: Secondary | ICD-10-CM | POA: Diagnosis not present

## 2023-04-17 ENCOUNTER — Encounter: Payer: Self-pay | Admitting: Endocrinology

## 2023-04-17 LAB — BASIC METABOLIC PANEL WITH GFR
BUN: 14 mg/dL (ref 7–25)
CO2: 28 mmol/L (ref 20–32)
Calcium: 9.4 mg/dL (ref 8.6–10.2)
Chloride: 103 mmol/L (ref 98–110)
Creat: 0.69 mg/dL (ref 0.50–0.99)
Glucose, Bld: 133 mg/dL — ABNORMAL HIGH (ref 65–99)
Potassium: 5 mmol/L (ref 3.5–5.3)
Sodium: 141 mmol/L (ref 135–146)
eGFR: 107 mL/min/{1.73_m2} (ref >=60)

## 2023-04-17 LAB — HEMOGLOBIN A1C
Hgb A1c MFr Bld: 7.5 %{Hb} — ABNORMAL HIGH (ref <5.7)
Mean Plasma Glucose: 169 mg/dL
eAG (mmol/L): 9.3 mmol/L

## 2023-04-17 LAB — MICROALBUMIN / CREATININE URINE RATIO
Creatinine, Urine: 100 mg/dL (ref 20–275)
Microalb Creat Ratio: 6 mg/g{creat} (ref <30)
Microalb, Ur: 0.6 mg/dL

## 2023-04-17 LAB — T4, FREE: Free T4: 2.1 ng/dL — ABNORMAL HIGH (ref 0.8–1.8)

## 2023-04-17 LAB — TSH: TSH: 0.13 m[IU]/L — ABNORMAL LOW

## 2023-04-20 ENCOUNTER — Ambulatory Visit (INDEPENDENT_AMBULATORY_CARE_PROVIDER_SITE_OTHER): Payer: BC Managed Care – PPO | Admitting: Endocrinology

## 2023-04-20 ENCOUNTER — Encounter: Payer: Self-pay | Admitting: Endocrinology

## 2023-04-20 VITALS — BP 126/82 | HR 102 | Ht 66.0 in | Wt 319.0 lb

## 2023-04-20 DIAGNOSIS — E039 Hypothyroidism, unspecified: Secondary | ICD-10-CM

## 2023-04-20 DIAGNOSIS — Z7985 Long-term (current) use of injectable non-insulin antidiabetic drugs: Secondary | ICD-10-CM

## 2023-04-20 DIAGNOSIS — E1165 Type 2 diabetes mellitus with hyperglycemia: Secondary | ICD-10-CM | POA: Diagnosis not present

## 2023-04-20 DIAGNOSIS — Z7984 Long term (current) use of oral hypoglycemic drugs: Secondary | ICD-10-CM

## 2023-04-20 MED ORDER — LEVOTHYROXINE SODIUM 100 MCG PO TABS: 100.0000 ug | ORAL_TABLET | Freq: Every day | ORAL | 3 refills | Status: DC

## 2023-04-20 NOTE — Progress Notes (Signed)
 Outpatient Endocrinology Note [place] Traxton Kolenda, MD   Patient's Name: Joan Walker    DOB: 10-19-1975    MRN: [id number]                                                    REASON OF VISIT: Follow up for type 2 diabetes mellitus /hypothyroidism  PCP: Irven Coe, MD  HISTORY OF PRESENT ILLNESS:   Joan Walker is a 48 y.o. old female with past medical history listed below, is here for follow up for type 2 diabetes mellitus / hypothyroidism.   Pertinent Diabetes History: Patient was previously seen by Dr. Lucianne Muss and was last time seen in June 2024.  Patient was diagnosed with type 2 diabetes mellitus in July 2020.  Baseline A1c was 10.4%.  She has uncontrolled type 2 diabetes mellitus.  Chronic Diabetes Complications : Retinopathy: yes. Last ophthalmology exam was done on annually, 01/19/2023, following with ophthalmology regularly.  No records available. Nephropathy: no, on ACE/ARB /lisinopril. Peripheral neuropathy: no Coronary artery disease: no Stroke: no  Relevant comorbidities and cardiovascular risk factors: Obesity: yes Body mass index is 51.49 kg/m.  Hypertension: Yes  Hyperlipidemia : Yes, on statin   Current / Home Diabetic regimen includes:  Metformin 1000 mg 2 times a day. Mounjaro 10 mg weekly. Glimepiride 2 mg daily.  Prior diabetic medications:  Glycemic data:   She has test supplies and glucometer however has not been checking blood sugar at home, no glucose data to review.  Hypoglycemia: Patient has no hypoglycemic episodes. Patient has hypoglycemia awareness.  Factors modifying glucose control: 1.  Diabetic diet assessment: 3 meals a day.  2.  Staying active or exercising: No formal exercise.  3.  Medication compliance: compliant all of the time.  # Primary hypothyroidism: - First diagnosed around 2015.  Levothyroxine dose was periodically adjusted over time.  She had been up to 300 mcg levothyroxine in the past.  Lately she has been taking  levothyroxine 250 mcg daily.  Interval history  Diabetes regimen as reviewed and noted above.  Diabetes control is slightly improved with A1c of 7.2%.  She lost about 11 pounds of weight in last 3 months.  She is taking Mounjaro 10 mg weekly and tolerating well, denies nausea or GI issues.  She has been on this dose for about 5 weeks.  She is taking levothyroxine 250 mcg daily, dose was increased in December, she denies palpitation or heat intolerance.  Laboratory result with low TSH and elevated free T4, as noted below.   Latest Reference Range & Units 04/16/23 07:55  TSH mIU/L 0.13 (L)  T4,Free(Direct) 0.8 - 1.8 ng/dL 2.1 (H)  (L): Data is abnormally low (H): Data is abnormally high  REVIEW OF SYSTEMS As per history of present illness.   PAST MEDICAL HISTORY: Past Medical History:  Diagnosis Date   Arthritis    Asthma    DM2 (diabetes mellitus, type 2) (HCC)    GERD (gastroesophageal reflux disease)    History of kidney stones    Hypercholesteremia    Hypothyroidism    Morbid obesity (HCC)     PAST SURGICAL HISTORY: Past Surgical History:  Procedure Laterality Date   CHOLECYSTECTOMY     COLONOSCOPY WITH PROPOFOL N/A 11/25/2022   Procedure: COLONOSCOPY WITH PROPOFOL;  Surgeon: Kerin Salen, MD;  Location: [facility];  Service: Gastroenterology;  Laterality: N/A;   CYSTOSCOPY W/ URETERAL STENT PLACEMENT Left 12/19/2021   Procedure: CYSTOSCOPY WITH POSSIBLE LEFT RETROGRADE PYELOGRAM/URETERAL  AND   STENT PLACEMENT;  Surgeon: Bjorn Pippin, MD;  Location: [facility] OR;  Service: Urology;  Laterality: Left;   CYSTOSCOPY/URETEROSCOPY/HOLMIUM LASER/STENT PLACEMENT Left 01/16/2022   Procedure: CYSTOSCOPY LEF TURETEROSCOPY/HOLMIUM LASER/STENT PLACEMENT;  Surgeon: Bjorn Pippin, MD;  Location: [facility];  Service: Urology;  Laterality: Left;   POLYPECTOMY  11/25/2022   Procedure: POLYPECTOMY;  Surgeon: Kerin Salen, MD;  Location: [facility];  Service: Gastroenterology;;   WISDOM TOOTH EXTRACTION       ALLERGIES: Allergies  Allergen Reactions   Bee Pollen Swelling    FAMILY HISTORY:  Family History  Problem Relation Age of Onset   Bladder Cancer Mother        22s   Prostate cancer Maternal Uncle    Lung cancer Paternal Aunt    Lung cancer Paternal Grandmother     SOCIAL HISTORY: Social History   Socioeconomic History   Marital status: Married    Spouse name: Not on file   Number of children: Not on file   Years of education: Not on file   Highest education level: Not on file  Occupational History   Not on file  Tobacco Use   Smoking status: Former    Current packs/day: 0.00    Types: Cigarettes    Quit date: 01/20/2004    Years since quitting: 19.2   Smokeless tobacco: Never  Vaping Use   Vaping status: Never Used  Substance and Sexual Activity   Alcohol use: No   Drug use: No   Sexual activity: Not Currently  Other Topics Concern   Not on file  Social History Narrative   Not on file   Social Drivers of Health   Financial Resource Strain: Not on file  Food Insecurity: Not on file  Transportation Needs: Not on file  Physical Activity: Not on file  Stress: Not on file  Social Connections: Not on file    MEDICATIONS:  Current Outpatient Medications  Medication Sig Dispense Refill   acetaminophen (TYLENOL) 500 MG tablet Take 1,000 mg by mouth every 6 (six) hours as needed for fever or moderate pain.     albuterol (VENTOLIN HFA) 108 (90 Base) MCG/ACT inhaler Inhale 2 puffs into the lungs as needed for wheezing or shortness of breath.     atorvastatin (LIPITOR) 20 MG tablet Take 1 tablet (20 mg total) by mouth daily. 90 tablet 3   calcium carbonate (TUMS - DOSED IN MG ELEMENTAL CALCIUM) 500 MG chewable tablet Chew 1 tablet (200 mg of elemental calcium total) by mouth 2 (two) times daily as needed for indigestion or heartburn.     CARBOXYMETHYLCELLUL-GLYCERIN OP Place 1 drop into both eyes as needed (dry eyes).     famotidine (PEPCID) 20 MG tablet Take  20 mg by mouth 2 (two) times daily.     fluticasone (FLONASE) 50 MCG/ACT nasal spray Place 1 spray into both nostrils as needed for allergies.     glimepiride (AMARYL) 2 MG tablet Take 1 tablet (2 mg total) by mouth daily before supper. 90 tablet 3   indapamide (LOZOL) 1.25 MG tablet Take 1.25 mg by mouth daily.     levothyroxine (SYNTHROID) 125 MCG tablet Take 2 tablets (250 mcg total) by mouth daily before breakfast. 1 TABLET BEFORE BREAKFAST DAILY EXCEPT HALF TABLET ON SUNDAYS 180 tablet 4   lisinopril (ZESTRIL) 20 MG tablet Take 20  mg by mouth every evening.     magnesium oxide (MAG-OX) 400 (240 Mg) MG tablet Take 400 mg by mouth at bedtime.     metFORMIN (GLUCOPHAGE) 500 MG tablet Take 2 tablets (1,000 mg total) by mouth 2 (two) times daily with a meal. 360 tablet 3   MOUNJARO 7.5 MG/0.5ML Pen Inject 7.5 mg into the skin once a week. 6 mL 1   nystatin (MYCOSTATIN/NYSTOP) powder Apply 1 Application topically as needed (rash/irritation).     oxybutynin (DITROPAN) 5 MG tablet Take 1 tablet (5 mg total) by mouth every 8 (eight) hours as needed for bladder spasms. 30 tablet 1   tirzepatide (MOUNJARO) 10 MG/0.5ML Pen Inject 10 mg into the skin once a week. 6 mL 4   levothyroxine (SYNTHROID) 100 MCG tablet Take 1 tablet (100 mcg total) by mouth daily before breakfast. Take together with levothyroxine with total of 225 mcg daily. 90 tablet 3   No current facility-administered medications for this visit.    PHYSICAL EXAM: Vitals:   04/20/23 0804  BP: 126/82  Pulse: (!) 102  SpO2: 98%  Weight: (!) 319 lb (144.7 kg)  Height: 5\' 6"  (1.676 m)   Body mass index is 51.49 kg/m.  Wt Readings from Last 3 Encounters:  04/20/23 (!) 319 lb (144.7 kg)  01/20/23 (!) 330 lb 3.2 oz (149.8 kg)  11/25/22 (!) 315 lb 14.7 oz (143.3 kg)    General: Well developed, well nourished female in no apparent distress.  HEENT: AT/[place], no external lesions.  Eyes: Conjunctiva clear and no icterus. Neck: Neck  supple  Lungs: Respirations not labored Neurologic: Alert, oriented, normal speech Extremities / Skin: Dry. No sores or rashes noted.  Psychiatric: Does not appear depressed or anxious  Diabetic Foot Exam - Simple   No data filed    LABS Reviewed Lab Results  Component Value Date   HGBA1C 7.5 (H) 04/16/2023   HGBA1C 7.7 (H) 01/16/2023   HGBA1C 8.2 (H) 07/04/2022   Lab Results  Component Value Date   FRUCTOSAMINE 227 03/18/2019   FRUCTOSAMINE 250 11/12/2018   Lab Results  Component Value Date   CHOL 159 01/16/2023   HDL 42 (L) 01/16/2023   LDLCALC 90 01/16/2023   LDLDIRECT 104.0 04/14/2022   TRIG 174 (H) 01/16/2023   CHOLHDL 3.8 01/16/2023   Lab Results  Component Value Date   MICRALBCREAT 6 04/16/2023   MICRALBCREAT 1.7 10/14/2021   Lab Results  Component Value Date   CREATININE 0.69 04/16/2023   Lab Results  Component Value Date   GFR 89.09 07/04/2022    ASSESSMENT / PLAN  1. Uncontrolled type 2 diabetes mellitus with hyperglycemia, without long-term current use of insulin (HCC)   2. Adult hypothyroidism   3. Severe obesity (BMI >= 40) (HCC)      Diabetes Mellitus type 2, complicated by diabetic retinopathy. - Diabetic status / severity: Uncontrolled.  Lab Results  Component Value Date   HGBA1C 7.5 (H) 04/16/2023    - Hemoglobin A1c goal : <7%  Discussed about importance of diabetes control to prevent chronic complications including diabetic retinopathy, neuropathy and nephropathy.  Adjusted diabetes regimen as follows.  - Medications: See below.  I) continue Mounjaro 10 mg weekly.  Patient is asked to call clinic in a month if no side effect will increase to 12.5 mg weekly. II) continue metformin 1000 mg 2 times a day. III) continue glimepiride 2 mg daily.  - Home glucose testing: Advised to check blood sugar in  the morning fasting and at bedtime. - Discussed/ Gave Hypoglycemia treatment plan.  # Consult : not required at this time.   #  Annual urine for microalbuminuria/ creatinine ratio, no microalbuminuria currently, continue ACE/ARB /lisinopril.  Normal urine microalbumin creatinine ratio recently. Last  Lab Results  Component Value Date   MICRALBCREAT 6 04/16/2023    # Foot check nightly.  # She has diabetic retinopathy, no records available, regular follow-up with ophthalmology.  Asked to send eye exam record to our clinic.   - Diet: Make healthy diabetic food choices - Life style / activity / exercise: Discussed.  2. Blood pressure  -  BP Readings from Last 1 Encounters:  04/20/23 126/82    - Control is in target.  - No change in current plans.  3. Lipid status / Hyperlipidemia - Last  Lab Results  Component Value Date   LDLCALC 90 01/16/2023   - Continue atorvastatin 20 mg daily.  # Primary hypothyroidism -She is currently taking levothyroxine 250 mcg daily.  Recent thyroid function test with low TSH and elevated free T4. -Decrease levothyroxine from 250 to 225 mcg daily.  Patient has 125 mcg tablet, she will be taking 1 tablet of 125 and 1 tablet of 100 mcg daily to make 225 mcg daily. -Will check TSH, free T4 prior to follow-up visit in 3 months.  # Morbid obesity BMI 53 -Discussed about lifestyle modification with calorie restriction, carbohydrate limitation and routine formal exercise.  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 -She has been following with bariatric surgery.  Analese was seen today for follow-up.  Diagnoses and all orders for this visit:  Uncontrolled type 2 diabetes mellitus with hyperglycemia, without long-term current use of insulin (HCC)  Adult hypothyroidism -     levothyroxine (SYNTHROID) 100 MCG tablet; Take 1 tablet (100 mcg total) by mouth daily before breakfast. Take together with levothyroxine with total of 225 mcg daily. -     T4, free -     TSH  Severe obesity (BMI >= 40)  (HCC)    DISPOSITION Follow up in clinic in 3  months suggested.   All questions answered and patient verbalized understanding of the plan.  [place] Jaxen Samples, MD [facility] Endocrinology [facility] [address] [place], [address] [place], [place] [address] Phone # [phone]  At least part of this note was generated using voice recognition software. Inadvertent word errors may have occurred, which were not recognized during the proofreading process.

## 2023-05-07 ENCOUNTER — Other Ambulatory Visit (HOSPITAL_COMMUNITY): Payer: Self-pay | Admitting: Surgery

## 2023-05-07 DIAGNOSIS — K219 Gastro-esophageal reflux disease without esophagitis: Secondary | ICD-10-CM | POA: Diagnosis not present

## 2023-05-07 DIAGNOSIS — E039 Hypothyroidism, unspecified: Secondary | ICD-10-CM | POA: Diagnosis not present

## 2023-05-07 DIAGNOSIS — E7800 Pure hypercholesterolemia, unspecified: Secondary | ICD-10-CM | POA: Diagnosis not present

## 2023-05-21 ENCOUNTER — Encounter: Payer: Self-pay | Admitting: Dietician

## 2023-05-21 ENCOUNTER — Encounter: Attending: Family Medicine | Admitting: Dietician

## 2023-05-21 VITALS — Ht 66.5 in | Wt 320.6 lb

## 2023-05-21 DIAGNOSIS — E669 Obesity, unspecified: Secondary | ICD-10-CM | POA: Insufficient documentation

## 2023-05-21 DIAGNOSIS — E119 Type 2 diabetes mellitus without complications: Secondary | ICD-10-CM | POA: Insufficient documentation

## 2023-05-21 NOTE — Progress Notes (Signed)
 Nutrition Assessment for Bariatric Surgery: Pre-Surgery Behavioral and Nutrition Intervention Program   Medical Nutrition Therapy  Appt Start Time: 1638    End Time: 1742  Patient was seen on 05/21/2023 for Pre-Operative Nutrition Assessment. Purpose of todays visit  enhance perioperative outcomes along with a healthy weight maintenance   Referral stated Supervised Weight Loss (SWL) visits needed: 12  Planned surgery: RYGB Pt expectation of surgery: to be about 180 lbs  NUTRITION ASSESSMENT   Anthropometrics  Start weight at [facility]: 320.6 lbs (date: 05/21/2023)  Height: 66.5 in BMI: 50.97 kg/m2    Clinical   Pharmacotherapy: History of weight loss medication used: Mounjaro  (for diabetes)  Medical hx: T2DM, hypercholesterolemia, obesity Medications: Mounjaro , glimepiride , indapamide, oxybutynin , levothyroxine , metformin , lisinopril , atorvastatin .  Labs: iron saturation 12, WBC 12.2, Vit D 11.4, Vit B12 231 Notable signs/symptoms: none noted Any previous deficiencies? No  Evaluation of Nutritional Deficiencies: Micronutrient Nutrition Focused Physical Exam: Hair: No issues observed Eyes: No issues observed Mouth: No issues observed Neck: No issues observed Nails: No issues observed Skin: No issues observed  Lifestyle & Dietary Hx  Pt states her pre-pregnancy was 222 lbs, about 17 years ago. Pt states she is will spend more time in the sun to try to get her vitamin D up. Pt states she can not do much physical activity, due to arthritis. Pt states she eats better when she cooks at home.  Current Physical Activity Recommendations state 150 minutes per week of moderate to vigorous movement including Cardio and 1-2 days of resistance activities as well as flexibility/balance activities:  Pts current physical activity: ADLs, walk the once a week, 10 minutes, with 0-5% recommendation reached   Sleep Hygiene: duration and quality: better lately, getting up once to use the  restroom, 6 hours per night  Current Patient Perceived Stress Level as stated by pt on a scale of 1-10:  3       Stress Management Techniques: breathing, chips and salsa  According to the Dietary Guidelines for Americans Recommendation: equivalent 1.5-2 cups fruits per day, equivalent 2-3 cups vegetables per day and at least half all grains whole  Fruit servings per day (on average): 0-1, meeting 0-50% recommendation  Non-starchy vegetable servings per day (on average): 1, meeting 33-50% recommendation  Whole Grains per day (on average): 1-2  Number of meals missed/skipped per week out of 21: 6  24-Hr Dietary Recall First Meal: skip or (home) bagel and cream cheese or egg; (out) fast food  Snack:  Second Meal: pizza or left overs Snack: no or small bag of chips or baby bell cheese Third Meal: chopped BBQ, with side (brussels sprouts) Snack: no Beverages: water , sprite, mountain dew,   Alcoholic beverages per week: 0 (less than one a month)  Estimated Energy Needs Calories: 1500  NUTRITION DIAGNOSIS  Overweight/obesity ([place]-3.3) related to past poor dietary habits and physical inactivity as evidenced by patient w/ planned RYGB surgery following dietary guidelines for continued weight loss.  NUTRITION INTERVENTION  Nutrition counseling (C-1) and education (E-2) to facilitate bariatric surgery goals.  Educated pt on micronutrient deficiencies post-surgery and behavioral/dietary strategies to start in order to mitigate that risk   Behavioral and Dietary Interventions Pre-Op Goals Reviewed with the Patient Nutrition: Healthy Eating Behaviors Switch to non-caloric, non-carbonated and non-caffeinated beverages such as  water , unsweetened tea, Crystal Light and zero calorie beverages (aim for 64 oz. per day) Cut out grazing between meals or at night  Find a protein shake you like Eat every 3-5  hours        Eliminate distractions while eating (TV, computer, reading, driving,  texting) Take 16-10 minutes to eat a meal  Decrease high sugar foods/decrease high fat/fried foods Eliminate alcoholic beverages Increase protein intake (eggs, fish, chicken, yogurt) before surgery Eat non starchy vegetables 2 times a day 7 days a week Eat complex carbohydrates such as whole grains and fruits   Behavioral Modification: Physical Activity Increase my usual daily activity (use stairs, park farther, etc.) Engage in _______________________  activity  _______ minutes ______ times per week  Other:    _________________________________________________________________     Problem Solving I will think about my usual eating patterns and how to tweak them How can my friends and family support me Barriers to starting my changes Learn and understand appetite verses hunger   Healthy Coping Allow for ___________ activities per week to help me manage stress Reframe negative thoughts I will keep a picture of someone or something that is my inspiration & look at it daily   Monitoring  Weigh myself once a week  Measure my progress by monitoring how my clothes fit Keep a food record of what I eat and drink for the next ________ (time period) Take pictures of what I eat and drink for the next ________ (time period) Use an app to count steps/day for the next_______ (time period) Measure my progress such as increased energy and more restful sleep Monitor your acid reflux and bowel habits, are they getting better?   *Goals that are bolded indicate the pt would like to start working towards these  Handouts Provided Include  Bariatric Surgery handouts (Nutrition Visits, Pre Surgery Behavioral Change Goals, Protein Shakes Brands to Choose From, Vitamins & Mineral Supplementation)  Learning Style & Readiness for Change Teaching method utilized: Visual, Auditory, and hands on  Demonstrated degree of understanding via: Teach Back  Readiness Level: preparation Barriers to learning/adherence  to lifestyle change: physical activity/ mobility (arthritis)  RD's Notes for Next Visit Patient progress toward chosen goals.   MONITORING & EVALUATION Dietary intake, weekly physical activity, body weight, and preoperative behavioral change goals   Next Steps  Patient is to follow up at [facility] in 2 weeks for first SWL visit.

## 2023-05-25 ENCOUNTER — Ambulatory Visit (HOSPITAL_COMMUNITY)
Admission: RE | Admit: 2023-05-25 | Discharge: 2023-05-25 | Disposition: A | Discharge status: Home / Self Care | Source: Ambulatory Visit | Attending: Surgery | Admitting: Surgery

## 2023-05-25 ENCOUNTER — Other Ambulatory Visit: Payer: Self-pay

## 2023-05-25 ENCOUNTER — Encounter (HOSPITAL_COMMUNITY)
Admission: RE | Admit: 2023-05-25 | Discharge: 2023-05-25 | Disposition: A | Discharge status: Home / Self Care | Source: Ambulatory Visit | Attending: Surgery | Admitting: Surgery

## 2023-05-25 DIAGNOSIS — Z01818 Encounter for other preprocedural examination: Secondary | ICD-10-CM

## 2023-06-04 ENCOUNTER — Encounter: Payer: Self-pay | Admitting: Dietician

## 2023-06-04 ENCOUNTER — Encounter: Attending: Surgery | Admitting: Dietician

## 2023-06-04 VITALS — Ht 66.5 in | Wt 315.0 lb

## 2023-06-04 DIAGNOSIS — E119 Type 2 diabetes mellitus without complications: Secondary | ICD-10-CM | POA: Insufficient documentation

## 2023-06-04 DIAGNOSIS — E669 Obesity, unspecified: Secondary | ICD-10-CM | POA: Insufficient documentation

## 2023-06-04 NOTE — Progress Notes (Unsigned)
 Supervised Weight Loss Visit Bariatric Nutrition Education Appt Start Time: 1624    End Time: 1645  Planned surgery: RYGB Pt expectation of surgery: to be about 180 lbs  Referral stated Supervised Weight Loss (SWL) visits needed: 12  1 out of 12 SWL Appointments   NUTRITION ASSESSMENT   Anthropometrics  Start weight at [facility]: 320.6 lbs (date: 05/21/2023)  Height: 66.5 in Weight today: 315.0 lbs. BMI: 50.08 kg/m2    Clinical   Pharmacotherapy: History of weight loss medication used: Mounjaro  (for diabetes)  Medical hx: T2DM, hypercholesterolemia, obesity Medications: Mounjaro , glimepiride , indapamide, oxybutynin , levothyroxine , metformin , lisinopril , atorvastatin .  Labs: iron saturation 12, WBC 12.2, Vit D 11.4, Vit B12 231 Notable signs/symptoms: none noted Any previous deficiencies? No  Lifestyle & Dietary Hx Pt states it is difficult to eat frequently throughout the day, stating however that she is more mindful of how she is eating her food, stating the meat first then the vegetables. Pt states she has the vitamin D, stating she hasn't started taking it yet. Pt states she is trying a women's one a day multivitamin that is chewable, stating it is hard to chew and swallow. Pt states she will have a snack mid-morning, stating that it is usually the rest of her breakfast. Pt states she may have pulled a muscle on the inside of her leg.  Estimated daily fluid intake: 64-80 oz Supplements: magnesium  Current average weekly physical activity: ADLs  24-Hr Dietary Recall First Meal: skip or (home) bagel and cream cheese or egg; (out) fast food  Snack: sometimes second half of breakfast Second Meal: pizza or left overs Snack: no or small bag of chips or baby bell cheese Third Meal: chopped BBQ, with side (brussels sprouts) Snack: no Beverages: water , sprite, mountain dew,   Alcoholic beverages per week: 0 (less than one a month)  Estimated Energy Needs Calories:  1500  NUTRITION DIAGNOSIS  Overweight/obesity ([place]-3.3) related to past poor dietary habits and physical inactivity as evidenced by patient w/ planned RYGB surgery following dietary guidelines for continued weight loss.  NUTRITION INTERVENTION  Nutrition counseling (C-1) and education (E-2) to facilitate bariatric surgery goals.  Pre-Op Goals Reviewed with the Patient  Vitamin D Supplements: Common doses range from 1,000 to 5,000 IU (International Units) per day, but your doctor may prescribe more based on your individual needs.    Dietary Sources: Fatty fish (salmon, mackerel, sardines), egg yolks, and fortified foods (milk, some cereals, orange juice) have some Vitamin D, but dietary intake alone is usually not enough to treat a deficiency. Focus on these as part of a balanced diet but rely on supplementation as the primary treatment.  Eating small, frequent meals after bariatric surgery is a vital strategy for adapting to your new anatomy, preventing complications, meeting your nutritional needs, and achieving long-term weight loss success. It requires a conscious effort and a shift in your eating habits, but it's a cornerstone of your post-operative lifestyle. Practicing now helps us  to be more successful after surgery.  Pre-Op Goals Progress & New Goals New: start taking vit D, and  regular women's multivitamin Continue: cutting out grazing between meals and at night. Continue: schedule meals and snacks  Handouts Provided Include    Learning Style & Readiness for Change Teaching method utilized: Visual & Auditory  Demonstrated degree of understanding via: Teach Back  Readiness Level: preparation Barriers to learning/adherence to lifestyle change: physical activity/ mobility (arthritis)  RD's Notes for next Visit  Patient progress toward chosen goals  MONITORING &  EVALUATION Dietary intake, weekly physical activity, body weight, and pre-op goals in 1 month.   Next Steps  Patient  is to return to [facility] in 2-3 weeks for next SWL visit.

## 2023-06-16 ENCOUNTER — Ambulatory Visit (HOSPITAL_COMMUNITY): Payer: Self-pay | Admitting: Licensed Clinical Social Worker

## 2023-06-16 ENCOUNTER — Encounter (HOSPITAL_COMMUNITY): Payer: Self-pay

## 2023-06-17 ENCOUNTER — Encounter: Payer: Self-pay | Admitting: Skilled Nursing Facility1

## 2023-06-17 ENCOUNTER — Encounter: Attending: Surgery | Admitting: Skilled Nursing Facility1

## 2023-06-17 DIAGNOSIS — E119 Type 2 diabetes mellitus without complications: Secondary | ICD-10-CM | POA: Diagnosis present

## 2023-06-17 NOTE — Progress Notes (Signed)
 Supervised Weight Loss Visit Bariatric Nutrition Education Appt Start Time: 7:28-7:40  Planned surgery: RYGB Pt expectation of surgery: to be about 180 lbs  Referral stated Supervised Weight Loss (SWL) visits needed: 12  2 out of 12 SWL Appointments   Virtually conducted with technical issues due to poor internet connection.   NUTRITION ASSESSMENT   Anthropometrics  Start weight at [facility]: 320.6 lbs (date: 05/21/2023)  Height: 66.5 in Weight today: virtual appt: pt agreeable to limitations of this visit type; pt identified by name and DOB BMI:   Clinical   Pharmacotherapy: History of weight loss medication used: Mounjaro  (for diabetes)  Medical hx: T2DM, hypercholesterolemia, obesity Medications: Mounjaro , glimepiride , indapamide, oxybutynin , levothyroxine , metformin , lisinopril , atorvastatin  Labs: iron saturation 12, WBC 12.2, Vit D 11.4, Vit B12 231, A1C 7.5 Notable signs/symptoms: none noted Any previous deficiencies? No  Lifestyle & Dietary Hx  Pt states not drinking with meals is tough.  Pt states she does not check her blood sugar stating she does not like needles.  Pt states their schedule is too crazy so they cannot eat out less and cook more from home maybe over the summer but not during the school year.   Pt states she will not have a problem cutting out Sprite as she is not addicted to it so it will be easy.    Estimated daily fluid intake: 64-80 oz Supplements: magnesium  Current average weekly physical activity: ADLs  24-Hr Dietary Recall First Meal: fast food oatmeal bar + sprite from mcdonalds Snack: sometimes second half of breakfast Second Meal: taco meat with cheese + chips and salsa Snack: cheese stick and watermelon Third Meal: chicken sandwich (fast food) + chips Snack: no Beverages: water , sprite, mountain dew, protein shake, lipton/brisk half and half  Alcoholic beverages per week: 0 (less than one a month)  Estimated Energy Needs Calories:  1500  NUTRITION DIAGNOSIS  Overweight/obesity ([place]-3.3) related to past poor dietary habits and physical inactivity as evidenced by patient w/ planned RYGB surgery following dietary guidelines for continued weight loss.  NUTRITION INTERVENTION  Nutrition counseling (C-1) and education (E-2) to facilitate bariatric surgery goals.  Pre-Op Goals Reviewed with the Patient   Eating small, frequent meals after bariatric surgery is a vital strategy for adapting to your new anatomy, preventing complications, meeting your nutritional needs, and achieving long-term weight loss success. It requires a conscious effort and a shift in your eating habits, but it's a cornerstone of your post-operative lifestyle. Practicing now helps us  to be more successful after surgery.  Pre-Op Goals Progress & New Goals Continue: start taking vit D, and  regular women's multivitamin Continue: cutting out grazing between meals and at night Continue: schedule meals and snacks NEW: cut out Sprite  Handouts Provided Include    Learning Style & Readiness for Change Teaching method utilized: Visual & Auditory  Demonstrated degree of understanding via: Teach Back  Readiness Level: preparation Barriers to learning/adherence to lifestyle change: physical activity/ mobility (arthritis)  RD's Notes for next Visit  Patient progress toward chosen goals  MONITORING & EVALUATION Dietary intake, weekly physical activity, body weight, and pre-op goals in 1 month.   Next Steps  Patient is to return to [facility] in 2-3 weeks for next SWL visit.

## 2023-07-02 ENCOUNTER — Encounter: Payer: Self-pay | Admitting: Dietician

## 2023-07-02 ENCOUNTER — Encounter: Attending: Surgery | Admitting: Dietician

## 2023-07-02 VITALS — Ht 66.5 in | Wt 310.5 lb

## 2023-07-02 DIAGNOSIS — E669 Obesity, unspecified: Secondary | ICD-10-CM | POA: Diagnosis present

## 2023-07-02 DIAGNOSIS — E119 Type 2 diabetes mellitus without complications: Secondary | ICD-10-CM | POA: Insufficient documentation

## 2023-07-02 NOTE — Progress Notes (Signed)
 Supervised Weight Loss Visit Bariatric Nutrition Education Appt Start Time: 6578 End Time: 0855  Planned surgery: RYGB Pt expectation of surgery: to be about 180 lbs  Referral stated Supervised Weight Loss (SWL) visits needed: 12  3 out of 12 SWL Appointments   NUTRITION ASSESSMENT   Anthropometrics  Start weight at [facility]: 320.6 lbs (date: 05/21/2023)  Height: 66.5 in Weight today: 310.5 lb BMI: 49.37  Clinical   Pharmacotherapy: History of weight loss medication used: Mounjaro  (for diabetes)  Medical hx: T2DM, hypercholesterolemia, obesity Medications: Mounjaro , glimepiride , indapamide, oxybutynin , levothyroxine , metformin , lisinopril , atorvastatin  Labs: iron saturation 12, WBC 12.2, Vit D 11.4, Vit B12 231, A1C 7.5 Notable signs/symptoms: none noted Any previous deficiencies? No  Lifestyle & Dietary Hx  Pt states she has avoided Sprite for a week, stating she has diet Sprite in the house a part of the change. Pt states she would have a hard time meal prepping breakfast, stating she would only pre-cook her bacon. Pt states she has been under the weather for a week, stating she is hoping she is on the mend. Pt states her sinuses have been acting up. Pt states she does not like drinking from a cup, stating she will drink from a straw, bottle or can. Pt states this is the first day for summer break, stating she will be able to cook more from home.  Estimated daily fluid intake: 64-80 oz Supplements: magnesium  Current average weekly physical activity: ADLs  24-Hr Dietary Recall First Meal: fast food oatmeal bar or egg mcmuffin + sprite from m[employer]onalds Snack: sometimes second half of breakfast Second Meal: taco meat with cheese + chips and salsa Snack: cheese stick and watermelon Third Meal: chicken sandwich (fast food) + chips Snack: no Beverages: water , sprite, mountain dew, protein shake, lipton/brisk half and half  Alcoholic beverages per week: 0 (less than one a  month)  Estimated Energy Needs Calories: 1500  NUTRITION DIAGNOSIS  Overweight/obesity ([place]-3.3) related to past poor dietary habits and physical inactivity as evidenced by patient w/ planned RYGB surgery following dietary guidelines for continued weight loss.  NUTRITION INTERVENTION  Nutrition counseling (C-1) and education (E-2) to facilitate bariatric surgery goals.  Pre-Op Goals Reviewed with the Patient  Eating small, frequent meals after bariatric surgery is a vital strategy for adapting to your new anatomy, preventing complications, meeting your nutritional needs, and achieving long-term weight loss success. It requires a conscious effort and a shift in your eating habits, but it's a cornerstone of your post-operative lifestyle. Practicing now helps us  to be more successful after surgery.  Pre-Op Goals Progress & New Goals Continue: start taking vit D, and  regular women's multivitamin Continue: cutting out grazing between meals and at night Continue: schedule meals and snacks New: practice not drinking with meals; take small bites, chew well, slow down at meal/snack time. Continue: cut out Sprite  Handouts Provided Include    Learning Style & Readiness for Change Teaching method utilized: Visual & Auditory  Demonstrated degree of understanding via: Teach Back  Readiness Level: preparation Barriers to learning/adherence to lifestyle change: physical activity/ mobility (arthritis)  RD's Notes for next Visit  Patient progress toward chosen goals  MONITORING & EVALUATION Dietary intake, weekly physical activity, body weight, and pre-op goals in 1 month.   Next Steps  Patient is to return to [facility] in 2 weeks for next SWL visit.

## 2023-07-03 ENCOUNTER — Other Ambulatory Visit: Payer: Self-pay | Admitting: Family Medicine

## 2023-07-03 ENCOUNTER — Ambulatory Visit
Admission: RE | Admit: 2023-07-03 | Discharge: 2023-07-03 | Disposition: A | Discharge status: Home / Self Care | Payer: BC Managed Care – PPO | Source: Ambulatory Visit | Attending: Family Medicine | Admitting: Family Medicine

## 2023-07-03 DIAGNOSIS — N6489 Other specified disorders of breast: Secondary | ICD-10-CM

## 2023-07-03 DIAGNOSIS — R928 Other abnormal and inconclusive findings on diagnostic imaging of breast: Secondary | ICD-10-CM | POA: Diagnosis not present

## 2023-07-03 DIAGNOSIS — Z0000 Encounter for general adult medical examination without abnormal findings: Secondary | ICD-10-CM

## 2023-07-06 ENCOUNTER — Encounter: Payer: Self-pay | Admitting: Family Medicine

## 2023-07-06 ENCOUNTER — Other Ambulatory Visit: Payer: Self-pay | Admitting: Family Medicine

## 2023-07-06 DIAGNOSIS — N6489 Other specified disorders of breast: Secondary | ICD-10-CM

## 2023-07-13 ENCOUNTER — Other Ambulatory Visit

## 2023-07-13 LAB — TSH: TSH: 0.11 m[IU]/L — ABNORMAL LOW

## 2023-07-13 LAB — T4, FREE: Free T4: 1.6 ng/dL (ref 0.8–1.8)

## 2023-07-14 ENCOUNTER — Ambulatory Visit: Payer: Self-pay | Admitting: Endocrinology

## 2023-07-14 ENCOUNTER — Ambulatory Visit (HOSPITAL_COMMUNITY): Admitting: Licensed Clinical Social Worker

## 2023-07-14 DIAGNOSIS — F4320 Adjustment disorder, unspecified: Secondary | ICD-10-CM
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
## 2023-07-16 ENCOUNTER — Encounter: Payer: Self-pay | Admitting: Dietician

## 2023-07-16 ENCOUNTER — Encounter: Attending: Surgery | Admitting: Dietician

## 2023-07-16 ENCOUNTER — Ambulatory Visit: Admitting: Endocrinology

## 2023-07-16 ENCOUNTER — Encounter: Payer: Self-pay | Admitting: Endocrinology

## 2023-07-16 ENCOUNTER — Ambulatory Visit: Payer: Self-pay | Admitting: Endocrinology

## 2023-07-16 ENCOUNTER — Ambulatory Visit: Admitting: Dietician

## 2023-07-16 VITALS — BP 110/50 | HR 73 | Resp 20 | Ht 66.5 in | Wt 309.4 lb

## 2023-07-16 VITALS — Ht 66.5 in | Wt 310.9 lb

## 2023-07-16 DIAGNOSIS — Z7985 Long-term (current) use of injectable non-insulin antidiabetic drugs: Secondary | ICD-10-CM | POA: Diagnosis not present

## 2023-07-16 DIAGNOSIS — E039 Hypothyroidism, unspecified: Secondary | ICD-10-CM

## 2023-07-16 DIAGNOSIS — E669 Obesity, unspecified: Secondary | ICD-10-CM | POA: Diagnosis present

## 2023-07-16 DIAGNOSIS — E1165 Type 2 diabetes mellitus with hyperglycemia: Secondary | ICD-10-CM | POA: Diagnosis not present

## 2023-07-16 DIAGNOSIS — E118 Type 2 diabetes mellitus with unspecified complications: Secondary | ICD-10-CM

## 2023-07-16 DIAGNOSIS — E119 Type 2 diabetes mellitus without complications: Secondary | ICD-10-CM | POA: Insufficient documentation

## 2023-07-16 DIAGNOSIS — Z7984 Long term (current) use of oral hypoglycemic drugs: Secondary | ICD-10-CM

## 2023-07-16 LAB — POCT GLYCOSYLATED HEMOGLOBIN (HGB A1C): Hemoglobin A1C: 6.2 % — AB (ref 4.0–5.6)

## 2023-07-16 MED ORDER — TIRZEPATIDE 12.5 MG/0.5ML ~~LOC~~ SOAJ: 12.5000 mg | SUBCUTANEOUS | 3 refills | Status: DC

## 2023-07-16 MED ORDER — LEVOTHYROXINE SODIUM 200 MCG PO TABS: 200.0000 ug | ORAL_TABLET | Freq: Every day | ORAL | 3 refills | Status: AC

## 2023-07-16 NOTE — Progress Notes (Signed)
 Outpatient Endocrinology Note Joan Morty Ortwein, MD   Patient's Name: Joan Walker    DOB: September 11, 1975    MRN: [id number]                                                    REASON OF VISIT: Follow up for type 2 diabetes mellitus /hypothyroidism  PCP: Leonel Cole, MD  HISTORY OF PRESENT ILLNESS:   Joan Walker is a 48 y.o. old female with past medical history listed below, is here for follow up for type 2 diabetes mellitus / hypothyroidism.   Pertinent Diabetes History: Patient was previously seen by Dr. Von and was last time seen in June 2024.  Patient was diagnosed with type 2 diabetes mellitus in July 2020.  Baseline A1c was 10.4%.    Chronic Diabetes Complications : Retinopathy: yes. Last ophthalmology exam was done on annually, 01/19/2023, following with ophthalmology regularly.  No records available. Nephropathy: no, on ACE/ARB /lisinopril . Peripheral neuropathy: no Coronary artery disease: no Stroke: no  Relevant comorbidities and cardiovascular risk factors: Obesity: yes Body mass index is 49.19 kg/m.  Hypertension: Yes  Hyperlipidemia : Yes, on statin   Current / Home Diabetic regimen includes:  Metformin  1000 mg 2 times a day. Mounjaro  10 mg weekly. Glimepiride  2 mg daily.  Prior diabetic medications:  Glycemic data:   She has test supplies and glucometer however has not been checking blood sugar at home, no glucose data to review.  Hypoglycemia: Patient has no hypoglycemic episodes. Patient has hypoglycemia awareness.  Factors modifying glucose control: 1.  Diabetic diet assessment: 3 meals a day.  2.  Staying active or exercising: No formal exercise.  3.  Medication compliance: compliant all of the time.  # Primary hypothyroidism: - First diagnosed around 2015.  Levothyroxine  dose was periodically adjusted over time.  She had been up to 300 mcg levothyroxine  in the past.  Lately she has been taking levothyroxine  225 mcg daily.  Interval history   Hemoglobin A1c up to 6.2%, congratulated her.  Diabetes regimen as reviewed and noted above.  She lost about 5 to 7 pounds of weight in last 3 months.  She has been tolerating Mounjaro  well, denies any nausea or GI issues.  She has been taking levothyroxine  225 mcg daily.  Patient had function test improvement on free T4 normalized with he still low TSH.  No palpitation or heat intolerance.  No other complaints today.   Latest Reference Range & Units 07/13/23 08:25  TSH mIU/L 0.11 (L)  T4,Free(Direct) 0.8 - 1.8 ng/dL 1.6  (L): Data is abnormally low  REVIEW OF SYSTEMS As per history of present illness.   PAST MEDICAL HISTORY: Past Medical History:  Diagnosis Date   Arthritis    Asthma    DM2 (diabetes mellitus, type 2) (HCC)    GERD (gastroesophageal reflux disease)    History of kidney stones    Hypercholesteremia    Hypothyroidism    Morbid obesity (HCC)     PAST SURGICAL HISTORY: Past Surgical History:  Procedure Laterality Date   CHOLECYSTECTOMY     COLONOSCOPY WITH PROPOFOL  N/A 11/25/2022   Procedure: COLONOSCOPY WITH PROPOFOL ;  Surgeon: Saintclair Jasper, MD;  Location: [facility];  Service: Gastroenterology;  Laterality: N/A;   CYSTOSCOPY W/ URETERAL STENT PLACEMENT Left 12/19/2021   Procedure: CYSTOSCOPY WITH POSSIBLE LEFT RETROGRADE  PYELOGRAM/URETERAL  AND   STENT PLACEMENT;  Surgeon: Watt Rush, MD;  Location: [facility] OR;  Service: Urology;  Laterality: Left;   CYSTOSCOPY/URETEROSCOPY/HOLMIUM LASER/STENT PLACEMENT Left 01/16/2022   Procedure: CYSTOSCOPY LEF TURETEROSCOPY/HOLMIUM LASER/STENT PLACEMENT;  Surgeon: Watt Rush, MD;  Location: [facility];  Service: Urology;  Laterality: Left;   POLYPECTOMY  11/25/2022   Procedure: POLYPECTOMY;  Surgeon: Saintclair Jasper, MD;  Location: [facility];  Service: Gastroenterology;;   WISDOM TOOTH EXTRACTION      ALLERGIES: Allergies  Allergen Reactions   Bee Pollen Swelling    FAMILY HISTORY:  Family History  Problem Relation Age of  Onset   Bladder Cancer Mother        68s   Prostate cancer Maternal Uncle    Lung cancer Paternal Aunt    Lung cancer Paternal Grandmother     SOCIAL HISTORY: Social History   Socioeconomic History   Marital status: Married    Spouse name: Not on file   Number of children: Not on file   Years of education: Not on file   Highest education level: Not on file  Occupational History   Not on file  Tobacco Use   Smoking status: Former    Current packs/day: 0.00    Types: Cigarettes    Quit date: 01/20/2004    Years since quitting: 19.4   Smokeless tobacco: Never  Vaping Use   Vaping status: Never Used  Substance and Sexual Activity   Alcohol use: No   Drug use: No   Sexual activity: Not Currently  Other Topics Concern   Not on file  Social History Narrative   Not on file   Social Drivers of Health   Financial Resource Strain: Not on file  Food Insecurity: Not on file  Transportation Needs: Not on file  Physical Activity: Not on file  Stress: Not on file  Social Connections: Not on file    MEDICATIONS:  Current Outpatient Medications  Medication Sig Dispense Refill   acetaminophen  (TYLENOL ) 500 MG tablet Take 1,000 mg by mouth every 6 (six) hours as needed for fever or moderate pain.     albuterol (VENTOLIN HFA) 108 (90 Base) MCG/ACT inhaler Inhale 2 puffs into the lungs as needed for wheezing or shortness of breath.     atorvastatin  (LIPITOR) 20 MG tablet Take 1 tablet (20 mg total) by mouth daily. 90 tablet 3   calcium  carbonate (TUMS - DOSED IN MG ELEMENTAL CALCIUM ) 500 MG chewable tablet Chew 1 tablet (200 mg of elemental calcium  total) by mouth 2 (two) times daily as needed for indigestion or heartburn.     CARBOXYMETHYLCELLUL-GLYCERIN OP Place 1 drop into both eyes as needed (dry eyes).     famotidine (PEPCID) 20 MG tablet Take 20 mg by mouth 2 (two) times daily.     fluticasone (FLONASE) 50 MCG/ACT nasal spray Place 1 spray into both nostrils as needed for  allergies.     glimepiride  (AMARYL ) 2 MG tablet Take 1 tablet (2 mg total) by mouth daily before supper. 90 tablet 3   indapamide (LOZOL) 1.25 MG tablet Take 1.25 mg by mouth daily.     lisinopril  (ZESTRIL ) 20 MG tablet Take 20 mg by mouth every evening.     magnesium  oxide (MAG-OX) 400 (240 Mg) MG tablet Take 400 mg by mouth at bedtime.     metFORMIN  (GLUCOPHAGE ) 500 MG tablet Take 2 tablets (1,000 mg total) by mouth 2 (two) times daily with a meal. 360 tablet 3  nystatin (MYCOSTATIN/NYSTOP) powder Apply 1 Application topically as needed (rash/irritation).     oxybutynin  (DITROPAN ) 5 MG tablet Take 1 tablet (5 mg total) by mouth every 8 (eight) hours as needed for bladder spasms. 30 tablet 1   tirzepatide  (MOUNJARO ) 12.5 MG/0.5ML Pen Inject 12.5 mg into the skin once a week. 6 mL 3   levothyroxine  (SYNTHROID ) 200 MCG tablet Take 1 tablet (200 mcg total) by mouth daily before breakfast. 1 TABLET BEFORE BREAKFAST DAILY EXCEPT HALF TABLET ON SUNDAYS 90 tablet 3   No current facility-administered medications for this visit.    PHYSICAL EXAM: Vitals:   07/16/23 0845  BP: (!) 110/50  Pulse: 73  Resp: 20  SpO2: 97%  Weight: (!) 309 lb 6.4 oz (140.3 kg)  Height: 5' 6.5 (1.689 m)    Body mass index is 49.19 kg/m.  Wt Readings from Last 3 Encounters:  07/16/23 (!) 310 lb 14.4 oz (141 kg)  07/16/23 (!) 309 lb 6.4 oz (140.3 kg)  07/02/23 (!) 310 lb 8 oz (140.8 kg)    General: Well developed, well nourished female in no apparent distress.  HEENT: AT/[place], no external lesions.  Eyes: Conjunctiva clear and no icterus. Neck: Neck supple  Lungs: Respirations not labored Neurologic: Alert, oriented, normal speech Extremities / Skin: Dry.  Psychiatric: Does not appear depressed or anxious  Diabetic Foot Exam - Simple   No data filed    LABS Reviewed Lab Results  Component Value Date   HGBA1C 6.2 (A) 07/16/2023   HGBA1C 7.5 (H) 04/16/2023   HGBA1C 7.7 (H) 01/16/2023   Lab Results   Component Value Date   FRUCTOSAMINE 227 03/18/2019   FRUCTOSAMINE 250 11/12/2018   Lab Results  Component Value Date   CHOL 159 01/16/2023   HDL 42 (L) 01/16/2023   LDLCALC 90 01/16/2023   LDLDIRECT 104.0 04/14/2022   TRIG 174 (H) 01/16/2023   CHOLHDL 3.8 01/16/2023   Lab Results  Component Value Date   MICRALBCREAT 6 04/16/2023   MICRALBCREAT 1.7 10/14/2021   Lab Results  Component Value Date   CREATININE 0.69 04/16/2023   Lab Results  Component Value Date   GFR 89.09 07/04/2022    ASSESSMENT / PLAN  1. Controlled type 2 diabetes mellitus with complication, without long-term current use of insulin  (HCC)   2. Adult hypothyroidism   3. Type 2 diabetes mellitus with hyperglycemia, without long-term current use of insulin  (HCC)       Diabetes Mellitus type 2, complicated by diabetic retinopathy. - Diabetic status / severity: controlled.  Improving.  Lab Results  Component Value Date   HGBA1C 6.2 (A) 07/16/2023    - Hemoglobin A1c goal : <6.5%  Diabetes control has been improving.  Adjusted diabetes regimen as follows.  - Medications: See below.  I) increase Mounjaro  from 10 to 12.5 mg weekly. II) continue metformin  1000 mg 2 times a day. III) continue glimepiride  2 mg daily.  - Home glucose testing: Advised to check blood sugar in the morning fasting and at bedtime.  - Discussed/ Gave Hypoglycemia treatment plan.  # Consult : not required at this time.   # Annual urine for microalbuminuria/ creatinine ratio, no microalbuminuria currently, continue ACE/ARB /lisinopril .  Normal urine microalbumin creatinine ratio recently. Last  Lab Results  Component Value Date   MICRALBCREAT 6 04/16/2023    # Foot check nightly.  # She has diabetic retinopathy, no records available, regular follow-up with ophthalmology.  Asked to send eye exam record to our clinic.   -  Diet: Make healthy diabetic food choices - Life style / activity / exercise: Discussed.  2.  Blood pressure  -  BP Readings from Last 1 Encounters:  07/16/23 (!) 110/50    - Control is in target.  - No change in current plans.  3. Lipid status / Hyperlipidemia - Last  Lab Results  Component Value Date   LDLCALC 90 01/16/2023   - Continue atorvastatin  20 mg daily.  # Primary hypothyroidism -She is currently taking levothyroxine  250 mcg daily.  Recent thyroid  function test with low TSH and normal free T4. - Will decrease levothyroxine  from 225 mcg to 200 mcg daily. -Will check TSH, free T4 prior to follow-up visit in 3 months.  # Morbid obesity BMI 53, improved to 49. -Discussed about lifestyle modification with calorie restriction, carbohydrate limitation and routine formal exercise.  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 -She has been following with bariatric surgery. - Mounjaro  would be helpful for weight loss as well.  Diagnoses and all orders for this visit:  Controlled type 2 diabetes mellitus with complication, without long-term current use of insulin  (HCC) -     POCT glycosylated hemoglobin (Hb A1C) -     tirzepatide  (MOUNJARO ) 12.5 MG/0.5ML Pen; Inject 12.5 mg into the skin once a week.  Adult hypothyroidism -     T4, free -     TSH  Type 2 diabetes mellitus with hyperglycemia, without long-term current use of insulin  (HCC) -     levothyroxine  (SYNTHROID ) 200 MCG tablet; Take 1 tablet (200 mcg total) by mouth daily before breakfast. 1 TABLET BEFORE BREAKFAST DAILY EXCEPT HALF TABLET ON SUNDAYS    DISPOSITION Follow up in clinic in 3  months suggested.  Labs prior to follow-up visit.   All questions answered and patient verbalized understanding of the plan.  Joan Fabiano Ginley, MD [facility] Endocrinology [facility] [address] [place], [address] [place], [place] [address] Phone # [phone]  At least part of this note was generated using voice recognition software. Inadvertent  word errors may have occurred, which were not recognized during the proofreading process.

## 2023-07-16 NOTE — Progress Notes (Signed)
 Supervised Weight Loss Visit Bariatric Nutrition Education Appt Start Time: 0908 End Time: 0925  Planned surgery: RYGB Pt expectation of surgery: to be about 180 lbs  Referral stated Supervised Weight Loss (SWL) visits needed: 12  4 out of 12 SWL Appointments   NUTRITION ASSESSMENT   Anthropometrics  Start weight at [facility]: 320.6 lbs (date: 05/21/2023)  Height: 66.5 in Weight today: 310.9 lb BMI: 49.43  Clinical   Pharmacotherapy: History of weight loss medication used: Mounjaro  (for diabetes)  Medical hx: T2DM, hypercholesterolemia, obesity Medications: Mounjaro , glimepiride , indapamide, oxybutynin , levothyroxine , metformin , lisinopril , atorvastatin  Labs: iron saturation 12, WBC 12.2, Vit D 11.4, Vit B12 231, A1C 7.5 Notable signs/symptoms: none noted Any previous deficiencies? No  Lifestyle & Dietary Hx  Pt states her A1c is down to 6.2. Pt states she sets alarm to take her Mounjaro  on Saturday morning. Pt states she has been practicing not drinking with her meals, stating she does well slowing down at meal time. Pt states she has been doing chair exercises, stating she does it in between games on her phone while ads are being played. Pt states she would like to start joining her family walking the dog, or walking inside the house.  Estimated daily fluid intake: 64-80 oz Supplements: magnesium , Vit D, daily multivitamin Current average weekly physical activity: chair exercises daily, 90 minutes  24-Hr Dietary Recall First Meal: fast food oatmeal bar or egg mcmuffin + sprite from mcdonalds Snack: sometimes second half of breakfast Second Meal: taco meat with cheese + chips and salsa Snack: cheese stick and watermelon Third Meal: chicken sandwich (fast food) + chips Snack: no Beverages: water , sprite, mountain dew, protein shake, lipton/brisk half and half  Alcoholic beverages per week: 0 (less than one a month)  Estimated Energy Needs Calories: 1500  NUTRITION  DIAGNOSIS  Overweight/obesity ([place]-3.3) related to past poor dietary habits and physical inactivity as evidenced by patient w/ planned RYGB surgery following dietary guidelines for continued weight loss.  NUTRITION INTERVENTION  Nutrition counseling (C-1) and education (E-2) to facilitate bariatric surgery goals.  Pre-Op Goals Reviewed with the Patient  Regular physical activity offers a profound array of health benefits that extend far beyond weight management. It significantly reduces the risk of numerous chronic diseases, including heart disease, stroke, type 2 diabetes, and various cancers, by improving cardiovascular health, regulating blood sugar, and boosting the immune system. Beyond physical well-being, exercise is a potent tool for enhancing mental health, alleviating symptoms of depression and anxiety, improving mood through the release of endorphins, and fostering better sleep patterns. Moreover, consistent physical activity strengthens bones and muscles, enhances cognitive function, and can even increase longevity, making it an indispensable component of a healthy lifestyle. Starting with small, manageable steps - such as short walks, taking the stairs, or incorporating light stretching - allows your body to gradually adapt, minimizing the risk of injury, preventing burnout, and fostering sustainable habits. This slow and steady progression builds confidence and makes the journey towards a healthier, more active lifestyle much more achievable and enjoyable in the long run.  Pre-Op Goals Progress & New Goals Continue: taking vit D, and  regular women's multivitamin Continue: cutting out grazing between meals and at night Continue: schedule meals and snacks Continue: practice not drinking with meals; take small bites, chew well, slow down at meal/snack time. Continue: cut out Sprite New: increase physical activity; aim for 2 days per week, for 20 minutes per day (3 minutes at a  time)  Handouts Provided Include  Learning Style & Readiness for Change Teaching method utilized: Visual & Auditory  Demonstrated degree of understanding via: Teach Back  Readiness Level: preparation Barriers to learning/adherence to lifestyle change: physical activity/ mobility (arthritis)  RD's Notes for next Visit  Patient progress toward chosen goals  MONITORING & EVALUATION Dietary intake, weekly physical activity, body weight, and pre-op goals in 1 month.   Next Steps  Patient is to return to [facility] in 2 weeks for next SWL visit.

## 2023-08-04 ENCOUNTER — Encounter: Attending: Surgery | Admitting: Dietician

## 2023-08-04 ENCOUNTER — Encounter: Payer: Self-pay | Admitting: Dietician

## 2023-08-04 VITALS — Ht 66.5 in | Wt 303.8 lb

## 2023-08-04 DIAGNOSIS — E669 Obesity, unspecified: Secondary | ICD-10-CM | POA: Diagnosis present

## 2023-08-04 DIAGNOSIS — E119 Type 2 diabetes mellitus without complications: Secondary | ICD-10-CM | POA: Diagnosis present

## 2023-08-04 NOTE — Progress Notes (Signed)
 Supervised Weight Loss Visit Bariatric Nutrition Education Appt Start Time: 1358 End Time: 1422  Planned surgery: RYGB Pt expectation of surgery: to be about 180 lbs  Referral stated Supervised Weight Loss (SWL) visits needed: 12  5 out of 12 SWL Appointments   NUTRITION ASSESSMENT   Anthropometrics  Start weight at [facility]: 320.6 lbs (date: 05/21/2023)  Height: 66.5 in Weight today: 303.8 lb BMI: 48.30  Clinical   Pharmacotherapy: History of weight loss medication used: Mounjaro  (for diabetes)  Medical hx: T2DM, hypercholesterolemia, obesity Medications: Mounjaro , glimepiride , indapamide, oxybutynin , levothyroxine , metformin , lisinopril , atorvastatin  Labs: iron saturation 12, WBC 12.2, Vit D 11.4, Vit B12 231, A1C 7.5 Notable signs/symptoms: none noted Any previous deficiencies? No  Lifestyle & Dietary Hx  Pt states she is not missing soda, stating she has cut back. Pt states she doesn't care for flavorings in her water  right now either. Pt states she has been on prednisone  due to poison ivy exposure recently. Pt states she has done well cooking at home. Pt states she is able to multi task and distractions do not impede her ability to aim for satisfaction, stating she always aims for satisfaction. Pt states she learned that with [employer].  Estimated daily fluid intake: 64 oz Supplements: magnesium , Vit D, daily multivitamin Current average weekly physical activity: chair exercises/resistance bands 3-4 days per week for about 20 minutes  24-Hr Dietary Recall First Meal: toast will pepper jelly on keto bread Snack: sometimes second half of breakfast Second Meal: grilled cheese sandwich Snack: watermelon Third Meal: wholly guacamole (sit down [place] dinner) Snack: no Beverages: water , protein shake, lipton/brisk half and half, Gatorade zero  Alcoholic beverages per week: 0 (less than one a month)  Estimated Energy Needs Calories: 1500  NUTRITION DIAGNOSIS   Overweight/obesity ([place]-3.3) related to past poor dietary habits and physical inactivity as evidenced by patient w/ planned RYGB surgery following dietary guidelines for continued weight loss.  NUTRITION INTERVENTION  Nutrition counseling (C-1) and education (E-2) to facilitate bariatric surgery goals.  Pre-Op Goals Reviewed with the Patient  Re-emphasized increasing physical activity. Regular physical activity offers a profound array of health benefits that extend far beyond weight management. It significantly reduces the risk of numerous chronic diseases, including heart disease, stroke, type 2 diabetes, and various cancers, by improving cardiovascular health, regulating blood sugar, and boosting the immune system. Beyond physical well-being, exercise is a potent tool for enhancing mental health, alleviating symptoms of depression and anxiety, improving mood through the release of endorphins, and fostering better sleep patterns. Moreover, consistent physical activity strengthens bones and muscles, enhances cognitive function, and can even increase longevity, making it an indispensable component of a healthy lifestyle. Starting with small, manageable steps - such as short walks, taking the stairs, or incorporating light stretching - allows your body to gradually adapt, minimizing the risk of injury, preventing burnout, and fostering sustainable habits. This slow and steady progression builds confidence and makes the journey towards a healthier, more active lifestyle much more achievable and enjoyable in the long run.  Pre-Op Goals Progress & New Goals Continue: taking vit D, and  regular women's multivitamin Continue: cutting out grazing between meals and at night Continue: schedule meals and snacks Continue: practice not drinking with meals; take small bites, chew well, slow down at meal/snack time. Continue: cut out Sprite Re-engage: increase physical activity; aim for 3 days per week, for 20  minutes per day (5-10 minutes at a time or walking the dog 20 minutes); increase intensity as  able. Continue: Aim for satisfaction before fullness;   Handouts Provided Include    Learning Style & Readiness for Change Teaching method utilized: Visual & Auditory  Demonstrated degree of understanding via: Teach Back  Readiness Level: preparation Barriers to learning/adherence to lifestyle change: physical activity/ mobility (arthritis)  RD's Notes for next Visit  Patient progress toward chosen goals  MONITORING & EVALUATION Dietary intake, weekly physical activity, body weight, and pre-op goals in 1 month.   Next Steps  Patient is to return to [facility] in 2 weeks for next SWL visit.

## 2023-08-18 ENCOUNTER — Encounter: Attending: Surgery | Admitting: Dietician

## 2023-08-18 ENCOUNTER — Encounter: Payer: Self-pay | Admitting: Dietician

## 2023-08-18 VITALS — Ht 66.5 in | Wt 306.9 lb

## 2023-08-18 DIAGNOSIS — E669 Obesity, unspecified: Secondary | ICD-10-CM | POA: Insufficient documentation

## 2023-08-18 DIAGNOSIS — E119 Type 2 diabetes mellitus without complications: Secondary | ICD-10-CM | POA: Insufficient documentation

## 2023-08-18 NOTE — Progress Notes (Signed)
 Supervised Weight Loss Visit Bariatric Nutrition Education Appt Start Time: 0925 End Time: 0951  Planned surgery: RYGB Pt expectation of surgery: to be about 180 lbs  Referral stated Supervised Weight Loss (SWL) visits needed: 12  6 out of 12 SWL Appointments   NUTRITION ASSESSMENT   Anthropometrics  Start weight at [facility]: 320.6 lbs (date: 05/21/2023)  Height: 66.5 in Weight today: 306.9 lb BMI: 48.79  Clinical   Pharmacotherapy: History of weight loss medication used: Mounjaro  (for diabetes)  Medical hx: T2DM, hypercholesterolemia, obesity Medications: Mounjaro , glimepiride , indapamide, oxybutynin , levothyroxine , metformin , lisinopril , atorvastatin  Labs: iron saturation 12, WBC 12.2, Vit D 11.4, Vit B12 231, A1C 7.5 Notable signs/symptoms: none noted Any previous deficiencies? No  Lifestyle & Dietary Hx  Pt states she can walk the entire block without breaks. Pt states her physical activity it increasing. Pt states her knee went out for a couple of days when she did not exercise. 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 Pt states she has not been using her handicap sign as much. Pt states she made some cheese chips, stating they are really good. Pt states she is trying out new recipes. Pt states she is confident that she follows all of the bariatric food patterns.  Estimated daily fluid intake: 48-64 oz Supplements: magnesium , Vit D, daily multivitamin Current average weekly physical activity: chair exercises/resistance bands 5 days per week for about 20 minutes, sometimes twice a day (knee gave out for 2 days)  24-Hr Dietary Recall First Meal: toast will pepper jelly on keto bread Snack: sometimes second half of breakfast Second Meal: grilled cheese sandwich Snack: watermelon Third Meal: wholly guacamole (sit down [place] dinner) Snack: no Beverages: water , protein shake, [place]/brisk half and half, Gatorade zero  Alcoholic  beverages per week: 0 (less than one a month)  Estimated Energy Needs Calories: 1500  NUTRITION DIAGNOSIS  Overweight/obesity ([place]-3.3) related to past poor dietary habits and physical inactivity as evidenced by patient w/ planned RYGB surgery following dietary guidelines for continued weight loss.  NUTRITION INTERVENTION  Nutrition counseling (C-1) and education (E-2) to facilitate bariatric surgery goals.  Pre-Op Goals Reviewed with the Patient  Why you need complex carbohydrates: Whole grains and other complex carbohydrates are required to have a healthy diet. Whole grains provide fiber which can help with blood glucose levels and help keep you satiated. Fruits and starchy vegetables provide essential vitamins and minerals required for immune function, eyesight support, brain support, bone density, wound healing and many other functions within the body. According to the current evidenced based 2020-2025 Dietary Guidelines for Americans, complex carbohydrates are part of a healthy eating pattern which is associated with a decreased risk for type 2 diabetes, cancers, and cardiovascular disease.   Pre-Op Goals Progress & New Goals Continue: taking vit D, and  regular women's multivitamin Continue: cutting out grazing between meals and at night Continue: schedule meals and snacks Continue: practice not drinking with meals; take small bites, chew well, slow down at meal/snack time. Continue: cut out Sprite Continue: increase physical activity; aim for 3 days per week, for 20 minutes per day (5-10 minutes at a time or walking the dog 20 minutes); increase intensity as able Continue: Aim for satisfaction before fullness New: Use the Bariatric MyPlate for portion control; increase non-starchy vegetables and still have some complex carbohydrates  Handouts Provided Include  Bariatric MyPlate  Learning Style & Readiness for Change Teaching method utilized: Visual & Auditory  Demonstrated  degree  of understanding via: Teach Back  Readiness Level: preparation Barriers to learning/adherence to lifestyle change: physical activity/ mobility (arthritis)  RD's Notes for next Visit  Patient progress toward chosen goals  MONITORING & EVALUATION Dietary intake, weekly physical activity, body weight, and pre-op goals in 1 month.   Next Steps  Patient is to return to [facility] in 1-2 weeks for next SWL visit.

## 2023-08-31 ENCOUNTER — Ambulatory Visit: Admitting: Dietician

## 2023-08-31 ENCOUNTER — Encounter: Attending: Surgery | Admitting: Dietician

## 2023-08-31 ENCOUNTER — Encounter: Payer: Self-pay | Admitting: Dietician

## 2023-08-31 VITALS — Ht 66.5 in | Wt 313.0 lb

## 2023-08-31 DIAGNOSIS — E119 Type 2 diabetes mellitus without complications: Secondary | ICD-10-CM | POA: Insufficient documentation

## 2023-08-31 DIAGNOSIS — E669 Obesity, unspecified: Secondary | ICD-10-CM | POA: Diagnosis present

## 2023-08-31 NOTE — Progress Notes (Signed)
 Supervised Weight Loss Visit Bariatric Nutrition Education Appt Start Time: 0940 End Time: 1001  Planned surgery: RYGB Pt expectation of surgery: to be about 180 lbs  Referral stated Supervised Weight Loss (SWL) visits needed: 12  7 out of 12 SWL Appointments   NUTRITION ASSESSMENT   Anthropometrics  Start weight at [facility]: 320.6 lbs (date: 05/21/2023)  Height: 66.5 in Weight today: 313.0 lb BMI: 49.76  Clinical   Pharmacotherapy: History of weight loss medication used: Mounjaro  (for diabetes)  Medical hx: T2DM, hypercholesterolemia, obesity; kidney stones Medications: Mounjaro , glimepiride , indapamide, oxybutynin , levothyroxine , metformin , lisinopril , atorvastatin  Labs: iron saturation 12, WBC 12.2, Vit D 11.4, Vit B12 231, A1C 7.5 Notable signs/symptoms: none noted Any previous deficiencies? No  Lifestyle & Dietary Hx  Pt states she went to two walking tours in [place], stating she did a lot of waking. Pt states at home walking the dog they added a loop. Pt states she can't understand why she is gaining weight with all of this physical activity. Pt states work starts for her on Thursday, before school starts. Pt states she stopped ordering drinks when eating out.  Estimated daily fluid intake: 64 oz Daily protein: not tracking Supplements: magnesium , Vit D, daily multivitamin Current average weekly physical activity: chair exercises/resistance bands 5 days per week for about 20 minutes, sometimes twice a day (knee gave out for 2 days).  24-Hr Dietary Recall First Meal: egg with keto toast Snack:  Second Meal: leftovers (lasagna or spinach/antichoke dip) Snack: no Third Meal: meat and vegetable Snack: no Beverages: water , Gatorade zero  Alcoholic beverages per week: 0 (less than one a month)  Estimated Energy Needs Calories: 1500  NUTRITION DIAGNOSIS  Overweight/obesity ([place]-3.3) related to past poor dietary habits and physical inactivity as evidenced by  patient w/ planned RYGB surgery following dietary guidelines for continued weight loss.  NUTRITION INTERVENTION  Nutrition counseling (C-1) and education (E-2) to facilitate bariatric surgery goals.  Pre-Op Goals Reviewed with the Patient  Tracking protein intake before and after bariatric surgery is essential for optimizing recovery, preserving muscle mass, and supporting long-term weight loss. Before surgery, adequate protein helps prepare the body for healing and minimizes muscle loss during the rapid weight reduction phase. After surgery, protein becomes even more critical due to reduced stomach capacity and altered digestion, which can impair nutrient absorption. Consuming sufficient protein --minimum of 60 grams per day promotes wound healing, maintains lean body mass, boosts metabolism, and enhances satiety, helping patients avoid overeating and regain weight.  Pre-Op Goals Progress & New Goals Continue: taking vit D, and  regular women's multivitamin Continue: cutting out grazing between meals and at night Continue: schedule meals and snacks Continue: practice not drinking with meals; take small bites, chew well, slow down at meal/snack time. Continue: cut out Sprite Continue: increase physical activity; aim for 3 days per week, for 20 minutes per day (5-10 minutes at a time or walking the dog 20 minutes); increase intensity as able Continue: Aim for satisfaction before fullness Continue: Use the Bariatric MyPlate for portion control; increase non-starchy vegetables and still have some complex carbohydrates New: track protein; aim for 60 grams per day  Handouts Provided Include    Learning Style & Readiness for Change Teaching method utilized: Visual & Auditory  Demonstrated degree of understanding via: Teach Back  Readiness Level: preparation Barriers to learning/adherence to lifestyle change: physical activity/ mobility (arthritis)  RD's Notes for next Visit  Patient progress  toward chosen goals  MONITORING & EVALUATION Dietary intake,  weekly physical activity, body weight, and pre-op goals in 1 month.   Next Steps  Patient is to return to [facility] in 1-2 weeks for next SWL visit.

## 2023-09-04 ENCOUNTER — Ambulatory Visit

## 2023-09-04 ENCOUNTER — Ambulatory Visit
Admission: RE | Admit: 2023-09-04 | Discharge: 2023-09-04 | Disposition: A | Discharge status: Home / Self Care | Source: Ambulatory Visit | Attending: Family Medicine | Admitting: Family Medicine

## 2023-09-04 DIAGNOSIS — N6489 Other specified disorders of breast: Secondary | ICD-10-CM

## 2023-09-07 ENCOUNTER — Emergency Department (HOSPITAL_COMMUNITY)

## 2023-09-07 ENCOUNTER — Inpatient Hospital Stay (HOSPITAL_COMMUNITY)

## 2023-09-07 ENCOUNTER — Inpatient Hospital Stay (HOSPITAL_COMMUNITY): Admitting: Critical Care Medicine

## 2023-09-07 ENCOUNTER — Encounter (HOSPITAL_COMMUNITY): Payer: Self-pay | Admitting: Internal Medicine

## 2023-09-07 ENCOUNTER — Inpatient Hospital Stay (HOSPITAL_COMMUNITY)
Admission: EM | Admit: 2023-09-07 | Discharge: 2023-09-12 | DRG: 853 | Disposition: A | Discharge status: Home / Self Care | Attending: Family Medicine | Admitting: Family Medicine

## 2023-09-07 ENCOUNTER — Encounter (HOSPITAL_COMMUNITY)
Admission: EM | Disposition: A | Discharge status: Home / Self Care | Payer: Self-pay | Source: Home / Self Care | Attending: Internal Medicine

## 2023-09-07 ENCOUNTER — Other Ambulatory Visit: Payer: Self-pay

## 2023-09-07 DIAGNOSIS — N179 Acute kidney failure, unspecified: Principal | ICD-10-CM

## 2023-09-07 DIAGNOSIS — R579 Shock, unspecified: Principal | ICD-10-CM

## 2023-09-07 DIAGNOSIS — I9589 Other hypotension: Secondary | ICD-10-CM | POA: Diagnosis not present

## 2023-09-07 DIAGNOSIS — N136 Pyonephrosis: Secondary | ICD-10-CM | POA: Diagnosis present

## 2023-09-07 DIAGNOSIS — K219 Gastro-esophageal reflux disease without esophagitis: Secondary | ICD-10-CM | POA: Diagnosis present

## 2023-09-07 DIAGNOSIS — Z7985 Long-term (current) use of injectable non-insulin antidiabetic drugs: Secondary | ICD-10-CM

## 2023-09-07 DIAGNOSIS — Z6841 Body Mass Index (BMI) 40.0 and over, adult: Secondary | ICD-10-CM | POA: Diagnosis not present

## 2023-09-07 DIAGNOSIS — R531 Weakness: Secondary | ICD-10-CM | POA: Diagnosis present

## 2023-09-07 DIAGNOSIS — J9601 Acute respiratory failure with hypoxia: Secondary | ICD-10-CM | POA: Diagnosis not present

## 2023-09-07 DIAGNOSIS — A419 Sepsis, unspecified organism: Secondary | ICD-10-CM | POA: Diagnosis present

## 2023-09-07 DIAGNOSIS — E7800 Pure hypercholesterolemia, unspecified: Secondary | ICD-10-CM | POA: Diagnosis present

## 2023-09-07 DIAGNOSIS — E039 Hypothyroidism, unspecified: Secondary | ICD-10-CM | POA: Diagnosis present

## 2023-09-07 DIAGNOSIS — J45909 Unspecified asthma, uncomplicated: Secondary | ICD-10-CM

## 2023-09-07 DIAGNOSIS — Z87891 Personal history of nicotine dependence: Secondary | ICD-10-CM

## 2023-09-07 DIAGNOSIS — Z9049 Acquired absence of other specified parts of digestive tract: Secondary | ICD-10-CM

## 2023-09-07 DIAGNOSIS — N200 Calculus of kidney: Secondary | ICD-10-CM | POA: Diagnosis not present

## 2023-09-07 DIAGNOSIS — N132 Hydronephrosis with renal and ureteral calculous obstruction: Secondary | ICD-10-CM

## 2023-09-07 DIAGNOSIS — Z79890 Hormone replacement therapy (postmenopausal): Secondary | ICD-10-CM

## 2023-09-07 DIAGNOSIS — E66813 Obesity, class 3: Secondary | ICD-10-CM | POA: Diagnosis present

## 2023-09-07 DIAGNOSIS — J9811 Atelectasis: Secondary | ICD-10-CM | POA: Diagnosis not present

## 2023-09-07 DIAGNOSIS — Z87442 Personal history of urinary calculi: Secondary | ICD-10-CM

## 2023-09-07 DIAGNOSIS — R9230 Dense breasts, unspecified: Secondary | ICD-10-CM | POA: Diagnosis present

## 2023-09-07 DIAGNOSIS — N170 Acute kidney failure with tubular necrosis: Secondary | ICD-10-CM | POA: Diagnosis present

## 2023-09-07 DIAGNOSIS — N201 Calculus of ureter: Secondary | ICD-10-CM | POA: Diagnosis not present

## 2023-09-07 DIAGNOSIS — Z7984 Long term (current) use of oral hypoglycemic drugs: Secondary | ICD-10-CM

## 2023-09-07 DIAGNOSIS — E872 Acidosis, unspecified: Secondary | ICD-10-CM | POA: Diagnosis present

## 2023-09-07 DIAGNOSIS — Z801 Family history of malignant neoplasm of trachea, bronchus and lung: Secondary | ICD-10-CM

## 2023-09-07 DIAGNOSIS — E871 Hypo-osmolality and hyponatremia: Secondary | ICD-10-CM | POA: Diagnosis present

## 2023-09-07 DIAGNOSIS — Z8052 Family history of malignant neoplasm of bladder: Secondary | ICD-10-CM

## 2023-09-07 DIAGNOSIS — A4151 Sepsis due to Escherichia coli [E. coli]: Principal | ICD-10-CM | POA: Diagnosis present

## 2023-09-07 DIAGNOSIS — Z1152 Encounter for screening for COVID-19: Secondary | ICD-10-CM | POA: Diagnosis not present

## 2023-09-07 DIAGNOSIS — R6521 Severe sepsis with septic shock: Secondary | ICD-10-CM | POA: Diagnosis present

## 2023-09-07 DIAGNOSIS — E860 Dehydration: Secondary | ICD-10-CM | POA: Diagnosis present

## 2023-09-07 DIAGNOSIS — Z79899 Other long term (current) drug therapy: Secondary | ICD-10-CM | POA: Diagnosis not present

## 2023-09-07 DIAGNOSIS — N1330 Unspecified hydronephrosis: Secondary | ICD-10-CM | POA: Diagnosis not present

## 2023-09-07 DIAGNOSIS — E119 Type 2 diabetes mellitus without complications: Secondary | ICD-10-CM | POA: Diagnosis present

## 2023-09-07 DIAGNOSIS — I10 Essential (primary) hypertension: Secondary | ICD-10-CM

## 2023-09-07 HISTORY — PX: CYSTOSCOPY W/ URETERAL STENT PLACEMENT: SHX1429

## 2023-09-07 LAB — CBC WITH DIFFERENTIAL/PLATELET
Basophils Absolute: 0.2 K/uL — ABNORMAL HIGH (ref 0.0–0.1)
Basophils Relative: 1 %
Eosinophils Absolute: 0 K/uL (ref 0.0–0.5)
Eosinophils Relative: 0 %
HCT: 35.5 % — ABNORMAL LOW (ref 36.0–46.0)
Hemoglobin: 11.3 g/dL — ABNORMAL LOW (ref 12.0–15.0)
Lymphocytes Relative: 10 %
Lymphs Abs: 1.8 K/uL (ref 0.7–4.0)
MCH: 28.1 pg (ref 26.0–34.0)
MCHC: 31.8 g/dL (ref 30.0–36.0)
MCV: 88.3 fL (ref 80.0–100.0)
Monocytes Absolute: 0.6 K/uL (ref 0.1–1.0)
Monocytes Relative: 3 %
Neutro Abs: 15.8 K/uL — ABNORMAL HIGH (ref 1.7–7.7)
Neutrophils Relative %: 86 %
Platelets: 244 K/uL (ref 150–400)
RBC: 4.02 MIL/uL (ref 3.87–5.11)
RDW: 15 % (ref 11.5–15.5)
WBC: 18.4 K/uL — ABNORMAL HIGH (ref 4.0–10.5)
nRBC: 0 % (ref 0.0–0.2)

## 2023-09-07 LAB — COMPREHENSIVE METABOLIC PANEL WITH GFR
ALT: 37 U/L (ref 0–44)
AST: 50 U/L — ABNORMAL HIGH (ref 15–41)
Albumin: 2.9 g/dL — ABNORMAL LOW (ref 3.5–5.0)
Alkaline Phosphatase: 46 U/L (ref 38–126)
Anion gap: 19 — ABNORMAL HIGH (ref 5–15)
BUN: 21 mg/dL — ABNORMAL HIGH (ref 6–20)
CO2: 18 mmol/L — ABNORMAL LOW (ref 22–32)
Calcium: 8.4 mg/dL — ABNORMAL LOW (ref 8.9–10.3)
Chloride: 95 mmol/L — ABNORMAL LOW (ref 98–111)
Creatinine, Ser: 2.23 mg/dL — ABNORMAL HIGH (ref 0.44–1.00)
GFR, Estimated: 27 mL/min — ABNORMAL LOW (ref >60)
Glucose, Bld: 267 mg/dL — ABNORMAL HIGH (ref 70–99)
Potassium: 4.1 mmol/L (ref 3.5–5.1)
Sodium: 132 mmol/L — ABNORMAL LOW (ref 135–145)
Total Bilirubin: 1.2 mg/dL (ref 0.0–1.2)
Total Protein: 6.2 g/dL — ABNORMAL LOW (ref 6.5–8.1)

## 2023-09-07 LAB — URINALYSIS, ROUTINE W REFLEX MICROSCOPIC
Bilirubin Urine: NEGATIVE
Glucose, UA: NEGATIVE mg/dL
Ketones, ur: NEGATIVE mg/dL
Nitrite: NEGATIVE
Protein, ur: 100 mg/dL — AB
Specific Gravity, Urine: 1.013 (ref 1.005–1.030)
pH: 5 (ref 5.0–8.0)

## 2023-09-07 LAB — RESP PANEL BY RT-PCR (RSV, FLU A&B, COVID)  RVPGX2
Influenza A by PCR: NEGATIVE
Influenza B by PCR: NEGATIVE
Resp Syncytial Virus by PCR: NEGATIVE
SARS Coronavirus 2 by RT PCR: NEGATIVE

## 2023-09-07 LAB — I-STAT VENOUS BLOOD GAS, ED
Acid-base deficit: 6 mmol/L — ABNORMAL HIGH (ref 0.0–2.0)
Bicarbonate: 20.1 mmol/L (ref 20.0–28.0)
Calcium, Ion: 1.05 mmol/L — ABNORMAL LOW (ref 1.15–1.40)
HCT: 35 % — ABNORMAL LOW (ref 36.0–46.0)
Hemoglobin: 11.9 g/dL — ABNORMAL LOW (ref 12.0–15.0)
O2 Saturation: 99 %
Potassium: 4 mmol/L (ref 3.5–5.1)
Sodium: 132 mmol/L — ABNORMAL LOW (ref 135–145)
TCO2: 21 mmol/L — ABNORMAL LOW (ref 22–32)
pCO2, Ven: 41.4 mmHg — ABNORMAL LOW (ref 44–60)
pH, Ven: 7.293 (ref 7.25–7.43)
pO2, Ven: 132 mmHg — ABNORMAL HIGH (ref 32–45)

## 2023-09-07 LAB — RAPID URINE DRUG SCREEN, HOSP PERFORMED
Amphetamines: NOT DETECTED
Barbiturates: NOT DETECTED
Benzodiazepines: NOT DETECTED
Cocaine: NOT DETECTED
Opiates: NOT DETECTED
Tetrahydrocannabinol: NOT DETECTED

## 2023-09-07 LAB — CREATININE, SERUM
Creatinine, Ser: 1.69 mg/dL — ABNORMAL HIGH (ref 0.44–1.00)
GFR, Estimated: 37 mL/min — ABNORMAL LOW (ref >60)

## 2023-09-07 LAB — I-STAT CG4 LACTIC ACID, ED
Lactic Acid, Venous: 8 mmol/L (ref 0.5–1.9)
Lactic Acid, Venous: 8.5 mmol/L (ref 0.5–1.9)
Lactic Acid, Venous: 6.6 mmol/L (ref 0.5–1.9)

## 2023-09-07 LAB — LIPASE, BLOOD: Lipase: 18 U/L (ref 11–51)

## 2023-09-07 LAB — BRAIN NATRIURETIC PEPTIDE: B Natriuretic Peptide: 180.3 pg/mL — ABNORMAL HIGH (ref 0.0–100.0)

## 2023-09-07 LAB — CBC
HCT: 31.8 % — ABNORMAL LOW (ref 36.0–46.0)
Hemoglobin: 10.2 g/dL — ABNORMAL LOW (ref 12.0–15.0)
MCH: 28.2 pg (ref 26.0–34.0)
MCHC: 32.1 g/dL (ref 30.0–36.0)
MCV: 87.8 fL (ref 80.0–100.0)
Platelets: 189 K/uL (ref 150–400)
RBC: 3.62 MIL/uL — ABNORMAL LOW (ref 3.87–5.11)
RDW: 15.1 % (ref 11.5–15.5)
WBC: 16.7 K/uL — ABNORMAL HIGH (ref 4.0–10.5)
nRBC: 0 % (ref 0.0–0.2)

## 2023-09-07 LAB — OSMOLALITY: Osmolality: 290 mosm/kg (ref 275–295)

## 2023-09-07 LAB — TROPONIN I (HIGH SENSITIVITY)
Troponin I (High Sensitivity): 20 ng/L — ABNORMAL HIGH (ref <18)
Troponin I (High Sensitivity): 17 ng/L (ref <18)

## 2023-09-07 LAB — T4, FREE: Free T4: 1.19 ng/dL — ABNORMAL HIGH (ref 0.61–1.12)

## 2023-09-07 LAB — BETA-HYDROXYBUTYRIC ACID: Beta-Hydroxybutyric Acid: 0.38 mmol/L — ABNORMAL HIGH (ref 0.05–0.27)

## 2023-09-07 LAB — HIV ANTIBODY (ROUTINE TESTING W REFLEX): HIV Screen 4th Generation wRfx: NONREACTIVE

## 2023-09-07 LAB — TSH: TSH: 2.268 u[IU]/mL (ref 0.350–4.500)

## 2023-09-07 LAB — CBG MONITORING, ED
Glucose-Capillary: 217 mg/dL — ABNORMAL HIGH (ref 70–99)
Glucose-Capillary: 186 mg/dL — ABNORMAL HIGH (ref 70–99)

## 2023-09-07 LAB — GLUCOSE, CAPILLARY: Glucose-Capillary: 177 mg/dL — ABNORMAL HIGH (ref 70–99)

## 2023-09-07 SURGERY — CYSTOSCOPY, WITH RETROGRADE PYELOGRAM AND URETERAL STENT INSERTION
Anesthesia: General | Laterality: Left

## 2023-09-07 MED ORDER — CHLORHEXIDINE GLUCONATE CLOTH 2 % EX PADS
6.0000 | MEDICATED_PAD | Freq: Every day | CUTANEOUS | Status: DC
Start: 2023-09-07 — End: 2023-09-11
  Administered 2023-09-07 – 2023-09-09 (×3): 6 via TOPICAL

## 2023-09-07 MED ORDER — DOCUSATE SODIUM 100 MG PO CAPS
100.0000 mg | ORAL_CAPSULE | Freq: Two times a day (BID) | ORAL | Status: DC | PRN
  Administered 2023-09-08: 100 mg via ORAL
  Filled 2023-09-07: qty 1

## 2023-09-07 MED ORDER — METRONIDAZOLE 500 MG/100ML IV SOLN
500.0000 mg | Freq: Once | INTRAVENOUS | Status: AC
  Administered 2023-09-07: 500 mg via INTRAVENOUS
  Filled 2023-09-07: qty 100

## 2023-09-07 MED ORDER — NOREPINEPHRINE 4 MG/250ML-% IV SOLN
0.0000 ug/min | INTRAVENOUS | Status: DC
  Administered 2023-09-07: 2 ug/min via INTRAVENOUS
  Administered 2023-09-08 (×2): 10 ug/min via INTRAVENOUS
  Administered 2023-09-08: 9 ug/min via INTRAVENOUS
  Administered 2023-09-09: 8 ug/min via INTRAVENOUS
  Administered 2023-09-09: 6 ug/min via INTRAVENOUS
  Filled 2023-09-07 (×6): qty 250

## 2023-09-07 MED ORDER — ACETAMINOPHEN 10 MG/ML IV SOLN
INTRAVENOUS | Status: AC
  Filled 2023-09-07: qty 100

## 2023-09-07 MED ORDER — SODIUM CHLORIDE 0.9 % IV SOLN
2.0000 g | Freq: Once | INTRAVENOUS | Status: AC
  Administered 2023-09-07: 2 g via INTRAVENOUS
  Filled 2023-09-07: qty 12.5

## 2023-09-07 MED ORDER — CHLORHEXIDINE GLUCONATE 0.12 % MT SOLN: 15.0000 mL | Freq: Once | OROMUCOSAL | Status: DC

## 2023-09-07 MED ORDER — VANCOMYCIN HCL IN DEXTROSE 1-5 GM/200ML-% IV SOLN: 1000.0000 mg | Freq: Once | INTRAVENOUS | Status: DC

## 2023-09-07 MED ORDER — INSULIN ASPART 100 UNIT/ML IJ SOLN
0.0000 [IU] | INTRAMUSCULAR | Status: DC
  Administered 2023-09-08 (×3): 2 [IU] via SUBCUTANEOUS

## 2023-09-07 MED ORDER — MIDAZOLAM HCL 2 MG/2ML IJ SOLN
INTRAMUSCULAR | Status: AC
  Filled 2023-09-07: qty 2

## 2023-09-07 MED ORDER — VANCOMYCIN HCL 2000 MG/400ML IV SOLN
2000.0000 mg | Freq: Once | INTRAVENOUS | Status: DC
  Filled 2023-09-07: qty 400

## 2023-09-07 MED ORDER — LACTATED RINGERS IV BOLUS: 500.0000 mL | Freq: Once | INTRAVENOUS | Status: DC

## 2023-09-07 MED ORDER — ROCURONIUM BROMIDE 100 MG/10ML IV SOLN
INTRAVENOUS | Status: DC | PRN
  Administered 2023-09-07: 40 mg via INTRAVENOUS

## 2023-09-07 MED ORDER — TAMSULOSIN HCL 0.4 MG PO CAPS: 0.4000 mg | ORAL_CAPSULE | Freq: Every day | ORAL | Status: DC

## 2023-09-07 MED ORDER — ONDANSETRON HCL 4 MG/2ML IJ SOLN
INTRAMUSCULAR | Status: DC | PRN
  Administered 2023-09-07: 4 mg via INTRAVENOUS

## 2023-09-07 MED ORDER — ETOMIDATE 2 MG/ML IV SOLN
INTRAVENOUS | Status: AC
  Filled 2023-09-07: qty 10

## 2023-09-07 MED ORDER — FENTANYL CITRATE (PF) 250 MCG/5ML IJ SOLN
INTRAMUSCULAR | Status: AC
  Filled 2023-09-07: qty 5

## 2023-09-07 MED ORDER — ACETAMINOPHEN 10 MG/ML IV SOLN
INTRAVENOUS | Status: DC | PRN
Start: 2023-09-07 — End: 2023-09-07
  Administered 2023-09-07: 1000 mg via INTRAVENOUS

## 2023-09-07 MED ORDER — WATER FOR IRRIGATION, STERILE IR SOLN
Status: DC | PRN
  Administered 2023-09-07: 3000 mL via INTRAVESICAL

## 2023-09-07 MED ORDER — PROPOFOL 10 MG/ML IV BOLUS
INTRAVENOUS | Status: AC
  Filled 2023-09-07: qty 20

## 2023-09-07 MED ORDER — FENTANYL CITRATE (PF) 100 MCG/2ML IJ SOLN: 25.0000 ug | INTRAMUSCULAR | Status: DC | PRN

## 2023-09-07 MED ORDER — POLYETHYLENE GLYCOL 3350 17 G PO PACK
17.0000 g | PACK | Freq: Every day | ORAL | Status: DC | PRN
  Filled 2023-09-07: qty 1

## 2023-09-07 MED ORDER — ETOMIDATE 2 MG/ML IV SOLN
INTRAVENOUS | Status: DC | PRN
  Administered 2023-09-07: 20 mg via INTRAVENOUS

## 2023-09-07 MED ORDER — VANCOMYCIN HCL 1000 MG IV SOLR
INTRAVENOUS | Status: DC | PRN
  Administered 2023-09-07: 2000 mg via INTRAVENOUS

## 2023-09-07 MED ORDER — ORAL CARE MOUTH RINSE: 15.0000 mL | Freq: Once | OROMUCOSAL | Status: DC

## 2023-09-07 MED ORDER — SODIUM CHLORIDE 0.9 % IV SOLN
250.0000 mL | INTRAVENOUS | Status: AC
  Administered 2023-09-07: 250 mL via INTRAVENOUS

## 2023-09-07 MED ORDER — LEVOTHYROXINE SODIUM 75 MCG PO TABS
200.0000 ug | ORAL_TABLET | Freq: Every day | ORAL | Status: DC
  Administered 2023-09-08 – 2023-09-12 (×4): 200 ug via ORAL
  Filled 2023-09-07 (×2): qty 2
  Filled 2023-09-07: qty 1
  Filled 2023-09-07: qty 2

## 2023-09-07 MED ORDER — LIDOCAINE HCL (CARDIAC) PF 100 MG/5ML IV SOSY
PREFILLED_SYRINGE | INTRAVENOUS | Status: DC | PRN
  Administered 2023-09-07: 100 mg via INTRAVENOUS

## 2023-09-07 MED ORDER — 0.9 % SODIUM CHLORIDE (POUR BTL) OPTIME
TOPICAL | Status: DC | PRN
  Administered 2023-09-07: 1000 mL

## 2023-09-07 MED ORDER — LACTATED RINGERS IV BOLUS
1000.0000 mL | Freq: Once | INTRAVENOUS | Status: AC
  Administered 2023-09-07: 1000 mL via INTRAVENOUS

## 2023-09-07 MED ORDER — SUCCINYLCHOLINE CHLORIDE 200 MG/10ML IV SOSY
PREFILLED_SYRINGE | INTRAVENOUS | Status: DC | PRN
  Administered 2023-09-07: 160 mg via INTRAVENOUS

## 2023-09-07 MED ORDER — ESMOLOL HCL 100 MG/10ML IV SOLN
INTRAVENOUS | Status: AC
  Filled 2023-09-07: qty 10

## 2023-09-07 MED ORDER — ONDANSETRON HCL 4 MG/2ML IJ SOLN: 4.0000 mg | Freq: Once | INTRAMUSCULAR | Status: DC | PRN

## 2023-09-07 MED ORDER — HEPARIN SODIUM (PORCINE) 5000 UNIT/ML IJ SOLN
5000.0000 [IU] | Freq: Three times a day (TID) | INTRAMUSCULAR | Status: DC
  Administered 2023-09-07 – 2023-09-08 (×2): 5000 [IU] via SUBCUTANEOUS
  Filled 2023-09-07: qty 1

## 2023-09-07 MED ORDER — LIDOCAINE 2% (20 MG/ML) 5 ML SYRINGE
INTRAMUSCULAR | Status: AC
  Filled 2023-09-07: qty 5

## 2023-09-07 MED ORDER — ACETAMINOPHEN 325 MG PO TABS
650.0000 mg | ORAL_TABLET | Freq: Four times a day (QID) | ORAL | Status: DC | PRN
  Filled 2023-09-07: qty 2

## 2023-09-07 MED ORDER — ESMOLOL HCL 100 MG/10ML IV SOLN
INTRAVENOUS | Status: DC | PRN
Start: 2023-09-07 — End: 2023-09-07
  Administered 2023-09-07: 20 mg via INTRAVENOUS

## 2023-09-07 MED ORDER — LACTATED RINGERS IV BOLUS
2000.0000 mL | Freq: Once | INTRAVENOUS | Status: AC
  Administered 2023-09-07: 2000 mL via INTRAVENOUS

## 2023-09-07 MED ORDER — ROCURONIUM BROMIDE 10 MG/ML (PF) SYRINGE
PREFILLED_SYRINGE | INTRAVENOUS | Status: AC
  Filled 2023-09-07: qty 10

## 2023-09-07 MED ORDER — LACTATED RINGERS IV SOLN: INTRAVENOUS | Status: DC

## 2023-09-07 MED ORDER — OXYCODONE HCL 5 MG PO TABS: 5.0000 mg | ORAL_TABLET | ORAL | Status: DC | PRN

## 2023-09-07 MED ORDER — SUGAMMADEX SODIUM 200 MG/2ML IV SOLN
INTRAVENOUS | Status: DC | PRN
  Administered 2023-09-07: 300 mg via INTRAVENOUS

## 2023-09-07 MED ORDER — CHLORHEXIDINE GLUCONATE 0.12 % MT SOLN
OROMUCOSAL | Status: AC
  Filled 2023-09-07: qty 15

## 2023-09-07 MED ORDER — IOHEXOL 300 MG/ML  SOLN
INTRAMUSCULAR | Status: DC | PRN
  Administered 2023-09-07: 7 mL via URETHRAL

## 2023-09-07 SURGICAL SUPPLY — 15 items
BAG DRAIN URO-CYSTO SKYTR STRL (DRAIN) ×2 IMPLANT
BAG URINE DRAIN 2000ML AR STRL (UROLOGICAL SUPPLIES) ×2 IMPLANT
CATH URETL OPEN END 6FR 70 (CATHETERS) ×2 IMPLANT
GLOVE BIO SURGEON STRL SZ8 (GLOVE) ×2 IMPLANT
GOWN STRL REUS W/ TWL LRG LVL3 (GOWN DISPOSABLE) ×2 IMPLANT
GOWN STRL REUS W/ TWL XL LVL3 (GOWN DISPOSABLE) ×2 IMPLANT
GUIDEWIRE ANG ZIPWIRE 038X150 (WIRE) IMPLANT
GUIDEWIRE STR DUAL SENSOR (WIRE) ×2 IMPLANT
KIT TURNOVER KIT B (KITS) ×2 IMPLANT
MANIFOLD NEPTUNE II (INSTRUMENTS) IMPLANT
NS IRRIG 1000ML POUR BTL (IV SOLUTION) IMPLANT
PACK CYSTO (CUSTOM PROCEDURE TRAY) ×2 IMPLANT
SYPHON OMNI JUG (MISCELLANEOUS) ×2 IMPLANT
TOWEL GREEN STERILE FF (TOWEL DISPOSABLE) ×2 IMPLANT
WATER STERILE IRR 3000ML UROMA (IV SOLUTION) ×4 IMPLANT

## 2023-09-07 NOTE — Progress Notes (Signed)
 VAST consult for PIV placement. Assessed bilateral arms/hands with US  and without. No appropriate veins noted for PIV placement at this time. Veins to small/deep for PIV placement. Notified nurse. Powell Bowler, RN VAST

## 2023-09-07 NOTE — Progress Notes (Addendum)
 Call placed to Dr. Corinne to inform of patient's blood pressures via cuff and A Line. Dr. Corinne responded to bedside and increased Levophed  to 8 mcg/min and ordered 500 ml bolus of LR. He Per Dr. Corinne abide by A line pressures and once stable she is ready to transfer to 4N 31. Patient is A&O X 4, denies pain.

## 2023-09-07 NOTE — Progress Notes (Signed)
 Dear Doctor: This patient has been identified as a candidate for PICC/CVC line for the following reason (s): IV therapy over 48 hours, drug extravasation potential with tissue necrosis (KCL, Dilantin, Dopamine, CaCl, MgSO4, chemo vesicant), poor veins/poor circulatory system (CHF, COPD, emphysema, diabetes, steroid use, IV drug abuse, etc.), and incompatible drugs (aminophyllin, TPN, heparin , given with an antibiotic) If you agree, please write an order for the indicated device.   Thank you for supporting the early vascular access assessment program.

## 2023-09-07 NOTE — Anesthesia Procedure Notes (Signed)
 Procedure Name: Intubation Date/Time: 09/07/2023 7:42 PM  Performed by: Nobel Brar T, CRNAPre-anesthesia Checklist: Patient identified, Emergency Drugs available, Suction available and Patient being monitored Patient Re-evaluated:Patient Re-evaluated prior to induction Oxygen Delivery Method: Circle system utilized Preoxygenation: Pre-oxygenation with 100% oxygen Induction Type: IV induction, Rapid sequence and Cricoid Pressure applied Ventilation: Mask ventilation without difficulty Laryngoscope Size: Mac and 4 Grade View: Grade II Tube type: Oral Tube size: 7.0 mm Number of attempts: 1 Airway Equipment and Method: Stylet and Oral airway Placement Confirmation: ETT inserted through vocal cords under direct vision, positive ETCO2 and breath sounds checked- equal and bilateral Secured at: 21 cm Tube secured with: Tape Dental Injury: Teeth and Oropharynx as per pre-operative assessment

## 2023-09-07 NOTE — Progress Notes (Signed)
 eLink Physician-Brief Progress Note Patient Name: Joan Walker DOB: 1975/05/07 MRN: [id number]   Date of Service  09/07/2023  HPI/Events of Note  48/F with DM2, renal stones, admitted for septic shock secondary to UTI with acute pyelonephritis with associated obstructing L ureteral stone. Pt was brought to the OR and underwent placement of a L JJ ureteral stent.   eICU Interventions  - Admit to ICU - Continue empiric antibiotics. Will follow cultures, deescalate as warranted - Continue maintenance IVF. Monitor I/Os closely.  - Titrate levophed  to maintain MAP 65 - Will follow WBC, lactate, temperature curve.         Joan Walker 09/07/2023, 10:15 PM  3:01 AM Pt has been unable to sleep due to general discomfort. Now pt complaining of shortness of breath.  BSRN reports that pt may have exerted herself while repositioning herself on the bed.  No associated desaturations reported.  Will check CXR to further evaluate. Pt did receive multiple boluses earlier in the admission.

## 2023-09-07 NOTE — Anesthesia Procedure Notes (Signed)
 Arterial Line Insertion Start/End8/18/2025 7:42 PM, 09/07/2023 7:44 PM Performed by: Jama Powell NOVAK, CRNA, CRNA  Patient location: OR. Preanesthetic checklist: patient identified, IV checked, site marked, risks and benefits discussed, surgical consent, monitors and equipment checked, pre-op evaluation, timeout performed and anesthesia consent Left, radial was placed Hand hygiene performed  and maximum sterile barriers used  Allen's test indicative of satisfactory collateral circulation Attempts: 1 Procedure performed without using ultrasound guided technique. Following insertion, Biopatch and dressing applied. Post procedure assessment: normal  Patient tolerated the procedure well with no immediate complications.

## 2023-09-07 NOTE — Transfer of Care (Signed)
 Immediate Anesthesia Transfer of Care Note  Patient: Joan Walker  Procedure(s) Performed: CYSTOSCOPY, WITH RETROGRADE PYELOGRAM AND URETERAL STENT INSERTION (Left)  Patient Location: PACU  Anesthesia Type:General  Level of Consciousness: drowsy  Airway & Oxygen Therapy: Patient Spontanous Breathing and Patient connected to face mask oxygen  Post-op Assessment: Report given to RN, Post -op Vital signs reviewed and stable, and Patient moving all extremities  Post vital signs: Reviewed and stable  Last Vitals:  Vitals Value Taken Time  BP 98/52 09/07/23 20:22  Temp 38.4 C 09/07/23 20:22  Pulse 106 09/07/23 20:26  Resp 26 09/07/23 20:26  SpO2 90 % 09/07/23 20:26  Vitals shown include unfiled device data.  Last Pain:  Vitals:   09/07/23 1858  TempSrc: Oral  PainSc: 0-No pain         Complications: No notable events documented.

## 2023-09-07 NOTE — H&P (Addendum)
 NAME:  Joan Walker, MRN:  [id number], DOB:  [birth year], LOS: 0 ADMISSION DATE:  09/07/2023, CONSULTATION DATE:  09/07/23 REFERRING MD:  EDP, CHIEF COMPLAINT:  nausea and fatigue   History of Present Illness:  Joan Walker is a 48 y.o. F with PMH of DM type 2, obesity, renal stones requiring stenting, HL and hypothyroidism who presents with two days of generalized fatigue, nausea and chills.   She has been able to tolerate very little by mouth, but denies dysuria, fever, diarrhea or URI symptoms.  She follows with Dr. Brunetta and was just seen outpatient a few days ago.    In the ED she was hypotensive despite 3L IVF and required low dose levophed , labs significant for lactic acid of 8.5, WBC of 18k and creatinine of 2.2.  A CT abd/pelvis showed a 3x3x5 stone in the distal L ureter with mild hydronephrosis.  UA pending She was given broad spectrum abx and PCCM consulted in the setting of septic shock   Pertinent  Medical History   has a past medical history of Arthritis, Asthma, DM2 (diabetes mellitus, type 2) (HCC), GERD (gastroesophageal reflux disease), History of kidney stones, Hypercholesteremia, Hypothyroidism, and Morbid obesity (HCC).   Significant Hospital Events: Including procedures, antibiotic start and stop dates in addition to other pertinent events   8/18 admit with septic shock from nephrolithiasis   Interim History / Subjective:   As above   Objective    Blood pressure 107/77, pulse (!) 115, temperature 98.7 F (37.1 C), temperature source Oral, resp. rate (!) 22, height 5' 7 (1.702 m), weight (!) 143.3 kg, last menstrual period 02/04/2023, SpO2 99%.        Intake/Output Summary (Last 24 hours) at 09/07/2023 1703 Last data filed at 09/07/2023 1643 Gross per 24 hour  Intake 2000 ml  Output --  Net 2000 ml   Filed Weights   09/07/23 0903  Weight: (!) 143.3 kg    General:  well nourished F resting in bed, ill-appearing in NAD HEENT: MM pink/moist Neuro:  alert and oriented  CV: s1s2 rrr, no m/r/g PULM:  clear bilaterally on RA  GI: soft, no flank pain, no abdominal TTP Extremities: warm/dry, 1+ edema     Resolved problem list   Assessment and Plan   Septic Shock secondary to renal stone and likely pyelonephritis UA pending Received 3L IVF -admit to ICU -continue levophed  to maintain MAP >65 -Received Vanc/Flagyl  and cefepime , continue broad spectrum coverage -follow blood and urine cultures -trend lactic acid -urology consult, appreciate recs -flomax    AKI Likely secondary to volume depletion and nephrolithiasis -follow renal indices after IVF -avoid nephrotoxins -monitor UOP and electrolytes   Type 2 DM Hold Metformin  and Mounjaro  -SSI  HTN Hold home Lisinopril   Hypothyroidism  -continue synthroid      Best Practice (right click and Reselect all SmartList Selections daily)   Diet/type: NPO w/ oral meds DVT prophylaxis prophylactic heparin   Pressure ulcer(s): N/A GI prophylaxis: N/A Lines: N/A Foley:  N/A Code Status:  full code Last date of multidisciplinary goals of care discussion [pending, pt updated]  Labs   CBC: Recent Labs  Lab 09/07/23 0907 09/07/23 0937  WBC 18.4*  --   NEUTROABS 15.8*  --   HGB 11.3* 11.9*  HCT 35.5* 35.0*  MCV 88.3  --   PLT 244  --     Basic Metabolic Panel: Recent Labs  Lab 09/07/23 0907 09/07/23 0937  NA 132* 132*  K 4.1 4.0  CL  95*  --   CO2 18*  --   GLUCOSE 267*  --   BUN 21*  --   CREATININE 2.23*  --   CALCIUM  8.4*  --    GFR: Estimated Creatinine Clearance: 45.9 mL/min (A) (by C-G formula based on SCr of 2.23 mg/dL (H)). Recent Labs  Lab 09/07/23 0907 09/07/23 0938 09/07/23 1122  WBC 18.4*  --   --   LATICACIDVEN  --  8.0* 8.5*    Liver Function Tests: Recent Labs  Lab 09/07/23 0907  AST 50*  ALT 37  ALKPHOS 46  BILITOT 1.2  PROT 6.2*  ALBUMIN  2.9*   Recent Labs  Lab 09/07/23 0907  LIPASE 18   No results for input(s):  AMMONIA in the last 168 hours.  ABG    Component Value Date/Time   HCO3 20.1 09/07/2023 0937   TCO2 21 (L) 09/07/2023 0937   ACIDBASEDEF 6.0 (H) 09/07/2023 0937   O2SAT 99 09/07/2023 0937     Coagulation Profile: No results for input(s): INR, PROTIME in the last 168 hours.  Cardiac Enzymes: No results for input(s): CKTOTAL, CKMB, CKMBINDEX, TROPONINI in the last 168 hours.  HbA1C: Hemoglobin A1C  Date/Time Value Ref Range Status  07/16/2023 08:46 AM 6.2 (A) 4.0 - 5.6 % Final   Hgb A1c MFr Bld  Date/Time Value Ref Range Status  04/16/2023 07:55 AM 7.5 (H) <5.7 % of total Hgb Final    Comment:    For someone without known diabetes, a hemoglobin A1c value of 6.5% or greater indicates that they may have  diabetes and this should be confirmed with a follow-up  test. . For someone with known diabetes, a value <7% indicates  that their diabetes is well controlled and a value  greater than or equal to 7% indicates suboptimal  control. A1c targets should be individualized based on  duration of diabetes, age, comorbid conditions, and  other considerations. . Currently, no consensus exists regarding use of hemoglobin A1c for diagnosis of diabetes for children. .   01/16/2023 08:46 AM 7.7 (H) <5.7 % of total Hgb Final    Comment:    For someone without known diabetes, a hemoglobin A1c value of 6.5% or greater indicates that they may have  diabetes and this should be confirmed with a follow-up  test. . For someone with known diabetes, a value <7% indicates  that their diabetes is well controlled and a value  greater than or equal to 7% indicates suboptimal  control. A1c targets should be individualized based on  duration of diabetes, age, comorbid conditions, and  other considerations. . Currently, no consensus exists regarding use of hemoglobin A1c for diagnosis of diabetes for children. .     CBG: Recent Labs  Lab 09/07/23 0903  GLUCAP 217*     Review of Systems:   Please see the history of present illness. All other systems reviewed and are negative    Past Medical History:  She,  has a past medical history of Arthritis, Asthma, DM2 (diabetes mellitus, type 2) (HCC), GERD (gastroesophageal reflux disease), History of kidney stones, Hypercholesteremia, Hypothyroidism, and Morbid obesity (HCC).   Surgical History:   Past Surgical History:  Procedure Laterality Date   CHOLECYSTECTOMY     COLONOSCOPY WITH PROPOFOL  N/A 11/25/2022   Procedure: COLONOSCOPY WITH PROPOFOL ;  Surgeon: Saintclair Jasper, MD;  Location: [facility];  Service: Gastroenterology;  Laterality: N/A;   CYSTOSCOPY W/ URETERAL STENT PLACEMENT Left 12/19/2021   Procedure: CYSTOSCOPY WITH POSSIBLE LEFT  RETROGRADE PYELOGRAM/URETERAL  AND   STENT PLACEMENT;  Surgeon: Watt Rush, MD;  Location: [facility] OR;  Service: Urology;  Laterality: Left;   CYSTOSCOPY/URETEROSCOPY/HOLMIUM LASER/STENT PLACEMENT Left 01/16/2022   Procedure: CYSTOSCOPY LEF TURETEROSCOPY/HOLMIUM LASER/STENT PLACEMENT;  Surgeon: Watt Rush, MD;  Location: [facility];  Service: Urology;  Laterality: Left;   POLYPECTOMY  11/25/2022   Procedure: POLYPECTOMY;  Surgeon: Saintclair Jasper, MD;  Location: [facility];  Service: Gastroenterology;;   WISDOM TOOTH EXTRACTION       Social History:   reports that she quit smoking about 19 years ago. Her smoking use included cigarettes. She has never used smokeless tobacco. She reports that she does not drink alcohol and does not use drugs.   Family History:  Her family history includes Bladder Cancer in her mother; Lung cancer in her paternal aunt and paternal grandmother; Prostate cancer in her maternal uncle.   Allergies Allergies  Allergen Reactions   Bee Pollen Swelling     Home Medications  Prior to Admission medications   Medication Sig Start Date End Date Taking? Authorizing Provider  acetaminophen  (TYLENOL ) 500 MG tablet Take 1,000 mg by mouth every 6 (six)  hours as needed for fever or moderate pain.   Yes [provider]  albuterol  (VENTOLIN  HFA) 108 (90 Base) MCG/ACT inhaler Inhale 2 puffs into the lungs as needed for wheezing or shortness of breath.   Yes [provider]  atorvastatin  (LIPITOR) 20 MG tablet Take 1 tablet (20 mg total) by mouth daily. 04/17/22  Yes Von Pacific, MD  CARBOXYMETHYLCELLUL-GLYCERIN OP Place 1 drop into both eyes as needed (dry eyes).   Yes [provider]  famotidine  (PEPCID ) 20 MG tablet Take 20 mg by mouth 2 (two) times daily as needed for heartburn or indigestion.   Yes [provider]  fluticasone (FLONASE) 50 MCG/ACT nasal spray Place 1 spray into both nostrils as needed for allergies.   Yes [provider]  glimepiride  (AMARYL ) 2 MG tablet Take 1 tablet (2 mg total) by mouth daily before supper. 11/07/22  Yes Thapa, [place], MD  indapamide (LOZOL) 1.25 MG tablet Take 1.25 mg by mouth daily.   Yes [provider]  levothyroxine  (SYNTHROID ) 200 MCG tablet Take 1 tablet (200 mcg total) by mouth daily before breakfast. 1 TABLET BEFORE BREAKFAST DAILY EXCEPT HALF TABLET ON SUNDAYS Patient taking differently: Take 200 mcg by mouth daily. 07/16/23  Yes Thapa, [place], MD  lisinopril  (ZESTRIL ) 20 MG tablet Take 20 mg by mouth every evening.   Yes [provider]  magnesium  oxide (MAG-OX) 400 (240 Mg) MG tablet Take 400 mg by mouth at bedtime.   Yes [provider]  metFORMIN  (GLUCOPHAGE ) 500 MG tablet Take 2 tablets (1,000 mg total) by mouth 2 (two) times daily with a meal. 01/20/23  Yes Thapa, [place], MD  oxybutynin  (DITROPAN ) 5 MG tablet Take 1 tablet (5 mg total) by mouth every 8 (eight) hours as needed for bladder spasms. 12/20/21  Yes Watt Rush, MD  tirzepatide  (MOUNJARO ) 12.5 MG/0.5ML Pen Inject 12.5 mg into the skin once a week. 07/16/23  Yes Thapa, [place], MD  predniSONE  (DELTASONE ) 20 MG tablet 3 tablets once a day for 5 days, 2 tablets once a day for 5  days, 1 tablet once a day for 5 days Orally; Duration: 15 days Patient not taking: Reported on 09/07/2023 08/10/23   [provider]     Critical care time: 45 minutes    CRITICAL CARE Performed by: Leita SAUNDERS Shadavia Dampier  Total critical care time: 45 minutes  Critical care time was exclusive of separately billable procedures and treating other patients.  Critical care was necessary to treat or prevent imminent or life-threatening deterioration.  Critical care was time spent personally by me on the following activities: development of treatment plan with patient and/or surrogate as well as nursing, discussions with consultants, evaluation of patient's response to treatment, examination of patient, obtaining history from patient or surrogate, ordering and performing treatments and interventions, ordering and review of laboratory studies, ordering and review of radiographic studies, pulse oximetry and re-evaluation of patient's condition.    Leita SAUNDERS Taahir Grisby, PA-C [facility] See Amion for pager If no response to pager , please call [phone] until 7pm After 7:00 pm call Elink  [phone]

## 2023-09-07 NOTE — Consult Note (Signed)
 Urology Consult  Referring physician: Dr. Theophilus Reason for referral: left ureteral calculus  Chief Complaint: abdominal pain  History of Present Illness: [name] is a 48yo with a history of nephrolithiasis who presented to the ER with a 3 day history of chills, abdominal pain, confusion. She currently see Dr. Watt at [facility] for nephrolithiasis. She required ureteral stent placement in 2023. Starting Saturday she developed nausea and vomiting, urinary urgency and frequency started yesterday. The abdominal pain and chills worsened today. She underwent CT in the ER which shows a 3mm left mid ureteral calculus. WBC count 18, UA concerning for infection.   Past Medical History:  Diagnosis Date   Arthritis    Asthma    DM2 (diabetes mellitus, type 2) (HCC)    GERD (gastroesophageal reflux disease)    History of kidney stones    Hypercholesteremia    Hypothyroidism    Morbid obesity (HCC)    Past Surgical History:  Procedure Laterality Date   CHOLECYSTECTOMY     COLONOSCOPY WITH PROPOFOL  N/A 11/25/2022   Procedure: COLONOSCOPY WITH PROPOFOL ;  Surgeon: Saintclair Jasper, MD;  Location: [facility];  Service: Gastroenterology;  Laterality: N/A;   CYSTOSCOPY W/ URETERAL STENT PLACEMENT Left 12/19/2021   Procedure: CYSTOSCOPY WITH POSSIBLE LEFT RETROGRADE PYELOGRAM/URETERAL  AND   STENT PLACEMENT;  Surgeon: Watt Rush, MD;  Location: [facility] OR;  Service: Urology;  Laterality: Left;   CYSTOSCOPY/URETEROSCOPY/HOLMIUM LASER/STENT PLACEMENT Left 01/16/2022   Procedure: CYSTOSCOPY LEF TURETEROSCOPY/HOLMIUM LASER/STENT PLACEMENT;  Surgeon: Watt Rush, MD;  Location: [facility];  Service: Urology;  Laterality: Left;   POLYPECTOMY  11/25/2022   Procedure: POLYPECTOMY;  Surgeon: Saintclair Jasper, MD;  Location: [facility];  Service: Gastroenterology;;   WISDOM TOOTH EXTRACTION      Medications: I have reviewed the patient's current medications. Allergies:  Allergies  Allergen Reactions   Bee Pollen  Swelling    Family History  Problem Relation Age of Onset   Bladder Cancer Mother        72s   Prostate cancer Maternal Uncle    Lung cancer Paternal Aunt    Lung cancer Paternal Grandmother    Social History:  reports that she quit smoking about 19 years ago. Her smoking use included cigarettes. She has never used smokeless tobacco. She reports that she does not drink alcohol and does not use drugs.  Review of Systems  Constitutional:  Positive for chills and fatigue.  Gastrointestinal:  Positive for abdominal pain, constipation, diarrhea, nausea and vomiting.  Genitourinary:  Positive for frequency and urgency.  All other systems reviewed and are negative.   Physical Exam:  Vital signs in last 24 hours: Temp:  [98.7 F (37.1 C)-100.7 F (38.2 C)] 100.7 F (38.2 C) (08/18 1802) Pulse Rate:  [106-118] 114 (08/18 1715) Resp:  [15-40] 35 (08/18 1715) BP: (72-119)/(34-77) 119/77 (08/18 1715) SpO2:  [95 %-100 %] 95 % (08/18 1715) Weight:  [143.3 kg] 143.3 kg (08/18 9096) Physical Exam Nursing note reviewed.  Constitutional:      Appearance: Normal appearance.  HENT:     Head: Normocephalic and atraumatic.     Nose: Nose normal. No congestion.     Mouth/Throat:     Mouth: Mucous membranes are dry.  Eyes:     Extraocular Movements: Extraocular movements intact.     Pupils: Pupils are equal, round, and reactive to light.  Cardiovascular:     Rate and Rhythm: Normal rate and regular rhythm.  Pulmonary:     Effort: Pulmonary effort  is normal. No respiratory distress.  Abdominal:     General: Abdomen is flat. There is no distension.  Musculoskeletal:        General: No swelling. Normal range of motion.     Cervical back: Normal range of motion and neck supple.     Right lower leg: Edema present.     Left lower leg: Edema present.  Skin:    General: Skin is warm and dry.  Neurological:     General: No focal deficit present.     Mental Status: She is alert and oriented  to person, place, and time.  Psychiatric:        Mood and Affect: Mood normal.        Behavior: Behavior normal.        Thought Content: Thought content normal.        Judgment: Judgment normal.     Laboratory Data:  Results for orders placed or performed during the hospital encounter of 09/07/23 (from the past 72 hours)  CBG monitoring, ED     Status: Abnormal   Collection Time: 09/07/23  9:03 AM  Result Value Ref Range   Glucose-Capillary 217 (H) 70 - 99 mg/dL    Comment: Glucose reference range applies only to samples taken after fasting for at least 8 hours.  CBC with Differential     Status: Abnormal   Collection Time: 09/07/23  9:07 AM  Result Value Ref Range   WBC 18.4 (H) 4.0 - 10.5 K/uL   RBC 4.02 3.87 - 5.11 MIL/uL   Hemoglobin 11.3 (L) 12.0 - 15.0 g/dL   HCT 64.4 (L) 63.9 - 53.9 %   MCV 88.3 80.0 - 100.0 fL   MCH 28.1 26.0 - 34.0 pg   MCHC 31.8 30.0 - 36.0 g/dL   RDW 84.9 88.4 - 84.4 %   Platelets 244 150 - 400 K/uL   nRBC 0.0 0.0 - 0.2 %   Neutrophils Relative % 86 %   Neutro Abs 15.8 (H) 1.7 - 7.7 K/uL   Lymphocytes Relative 10 %   Lymphs Abs 1.8 0.7 - 4.0 K/uL   Monocytes Relative 3 %   Monocytes Absolute 0.6 0.1 - 1.0 K/uL   Eosinophils Relative 0 %   Eosinophils Absolute 0.0 0.0 - 0.5 K/uL   Basophils Relative 1 %   Basophils Absolute 0.2 (H) 0.0 - 0.1 K/uL   WBC Morphology See Note     Comment:  Vaculated Neutrophils  Increased Bands. >20% Bands  Moderate Left Shift. >5% Metas and Myelos   Smear Review See Note     Comment:  Normal Platelet Morphology Performed at [facility], [address], [place], [place] [address]   Comprehensive metabolic panel     Status: Abnormal   Collection Time: 09/07/23  9:07 AM  Result Value Ref Range   Sodium 132 (L) 135 - 145 mmol/L   Potassium 4.1 3.5 - 5.1 mmol/L   Chloride 95 (L) 98 - 111 mmol/L   CO2 18 (L) 22 - 32 mmol/L   Glucose, Bld 267 (H) 70 - 99 mg/dL    Comment: Glucose reference range applies  only to samples taken after fasting for at least 8 hours.   BUN 21 (H) 6 - 20 mg/dL   Creatinine, Ser 7.76 (H) 0.44 - 1.00 mg/dL   Calcium  8.4 (L) 8.9 - 10.3 mg/dL   Total Protein 6.2 (L) 6.5 - 8.1 g/dL   Albumin  2.9 (L) 3.5 - 5.0  g/dL   AST 50 (H) 15 - 41 U/L   ALT 37 0 - 44 U/L   Alkaline Phosphatase 46 38 - 126 U/L   Total Bilirubin 1.2 0.0 - 1.2 mg/dL   GFR, Estimated 27 (L) >60 mL/min    Comment: (NOTE) Calculated using the CKD-EPI Creatinine Equation (2021)    Anion gap 19 (H) 5 - 15    Comment: Performed at [facility], [address], [place], [place] [address]  Lipase, blood     Status: None   Collection Time: 09/07/23  9:07 AM  Result Value Ref Range   Lipase 18 11 - 51 U/L    Comment: Performed at [facility], [address], [place], [place] [address]  Osmolality     Status: None   Collection Time: 09/07/23  9:07 AM  Result Value Ref Range   Osmolality 290 275 - 295 mOsm/kg    Comment: Performed at [facility], [address], [place], [place] [address]  T4, free     Status: Abnormal   Collection Time: 09/07/23  9:07 AM  Result Value Ref Range   Free T4 1.19 (H) 0.61 - 1.12 ng/dL    Comment: (NOTE) Biotin ingestion may interfere with free T4 tests. If the results are inconsistent with the TSH level, previous test results, or the clinical presentation, then consider biotin interference. If needed, order repeat testing after stopping biotin. Performed at [facility], [address] [place], [place] [address]   Resp panel by RT-PCR (RSV, Flu A&B, Covid) Anterior Nasal Swab     Status: None   Collection Time: 09/07/23  9:07 AM   Specimen: Anterior Nasal Swab  Result Value Ref Range   SARS Coronavirus 2 by RT PCR NEGATIVE NEGATIVE   Influenza A by PCR NEGATIVE NEGATIVE   Influenza B by PCR NEGATIVE NEGATIVE    Comment: (NOTE) The Xpert Xpress SARS-CoV-2/FLU/RSV plus assay is intended as an aid in the diagnosis of influenza from  Nasopharyngeal swab specimens and should not be used as a sole basis for treatment. Nasal washings and aspirates are unacceptable for Xpert Xpress SARS-CoV-2/FLU/RSV testing.  Fact Sheet for Patients: BloggerCourse.com  Fact Sheet for Healthcare Providers: SeriousBroker.it  This test is not yet approved or cleared by the [place]  FDA and has been authorized for detection and/or diagnosis of SARS-CoV-2 by FDA under an Emergency Use Authorization (EUA). This EUA will remain in effect (meaning this test can be used) for the duration of the COVID-19 declaration under Section 564(b)(1) of the Act, 21 U.S.C. section 360bbb-3(b)(1), unless the authorization is terminated or revoked.     Resp Syncytial Virus by PCR NEGATIVE NEGATIVE    Comment: (NOTE) Fact Sheet for Patients: BloggerCourse.com  Fact Sheet for Healthcare Providers: SeriousBroker.it  This test is not yet approved or cleared by the [place]  FDA and has been authorized for detection and/or diagnosis of SARS-CoV-2 by FDA under an Emergency Use Authorization (EUA). This EUA will remain in effect (meaning this test can be used) for the duration of the COVID-19 declaration under Section 564(b)(1) of the Act, 21 U.S.C. section 360bbb-3(b)(1), unless the authorization is terminated or revoked.  Performed at [facility], [address], [place], [place] [address]   Troponin I (High Sensitivity)     Status: Abnormal   Collection Time: 09/07/23  9:07 AM  Result Value Ref Range   Troponin I (High Sensitivity) 20 (H) <18 ng/L  Comment: (NOTE) Elevated high sensitivity troponin I (hsTnI) values and significant  changes across serial measurements may suggest ACS but many other  chronic and acute conditions are known to elevate hsTnI results.  Refer to the Links section for chest pain algorithms and additional   guidance. Performed at [facility], [address] [place], [place] [address]   I-Stat venous blood gas, [facility] ED, [facility], [facility])     Status: Abnormal   Collection Time: 09/07/23  9:37 AM  Result Value Ref Range   pH, Ven 7.293 7.25 - 7.43   pCO2, Ven 41.4 (L) 44 - 60 mmHg   pO2, Ven 132 (H) 32 - 45 mmHg   Bicarbonate 20.1 20.0 - 28.0 mmol/L   TCO2 21 (L) 22 - 32 mmol/L   O2 Saturation 99 %   Acid-base deficit 6.0 (H) 0.0 - 2.0 mmol/L   Sodium 132 (L) 135 - 145 mmol/L   Potassium 4.0 3.5 - 5.1 mmol/L   Calcium , Ion 1.05 (L) 1.15 - 1.40 mmol/L   HCT 35.0 (L) 36.0 - 46.0 %   Hemoglobin 11.9 (L) 12.0 - 15.0 g/dL   Sample type VENOUS   I-Stat CG4 Lactic Acid     Status: Abnormal   Collection Time: 09/07/23  9:38 AM  Result Value Ref Range   Lactic Acid, Venous 8.0 (HH) 0.5 - 1.9 mmol/L   Comment NOTIFIED PHYSICIAN   Troponin I (High Sensitivity)     Status: None   Collection Time: 09/07/23 11:15 AM  Result Value Ref Range   Troponin I (High Sensitivity) 17 <18 ng/L    Comment: (NOTE) Elevated high sensitivity troponin I (hsTnI) values and significant  changes across serial measurements may suggest ACS but many other  chronic and acute conditions are known to elevate hsTnI results.  Refer to the Links section for chest pain algorithms and additional  guidance. Performed at [facility], [address], [place], [place] [address]   I-Stat CG4 Lactic Acid     Status: Abnormal   Collection Time: 09/07/23 11:22 AM  Result Value Ref Range   Lactic Acid, Venous 8.5 (HH) 0.5 - 1.9 mmol/L   Comment NOTIFIED PHYSICIAN   Urinalysis, Routine w reflex microscopic -Urine, Clean Catch     Status: Abnormal   Collection Time: 09/07/23  5:19 PM  Result Value Ref Range   Color, Urine YELLOW YELLOW   APPearance CLOUDY (A) CLEAR   Specific Gravity, Urine 1.013 1.005 - 1.030   pH 5.0 5.0 - 8.0   Glucose, UA NEGATIVE NEGATIVE mg/dL   Hgb urine dipstick MODERATE (A) NEGATIVE    Bilirubin Urine NEGATIVE NEGATIVE   Ketones, ur NEGATIVE NEGATIVE mg/dL   Protein, ur 899 (A) NEGATIVE mg/dL   Nitrite NEGATIVE NEGATIVE   Leukocytes,Ua SMALL (A) NEGATIVE   RBC / HPF 0-5 0 - 5 RBC/hpf   WBC, UA 21-50 0 - 5 WBC/hpf   Bacteria, UA RARE (A) NONE SEEN   Squamous Epithelial / HPF 0-5 0 - 5 /HPF   Mucus PRESENT    Granular Casts, UA PRESENT    Amorphous Crystal PRESENT     Comment: Performed at [facility], [address] [place], [place] [address]  Rapid urine drug screen (hospital performed)     Status: None   Collection Time: 09/07/23  5:19 PM  Result Value Ref Range   Opiates NONE DETECTED NONE DETECTED   Cocaine NONE DETECTED NONE DETECTED   Benzodiazepines NONE  DETECTED NONE DETECTED   Amphetamines NONE DETECTED NONE DETECTED   Tetrahydrocannabinol NONE DETECTED NONE DETECTED   Barbiturates NONE DETECTED NONE DETECTED    Comment: (NOTE) DRUG SCREEN FOR MEDICAL PURPOSES ONLY.  IF CONFIRMATION IS NEEDED FOR ANY PURPOSE, NOTIFY LAB WITHIN 5 DAYS.  LOWEST DETECTABLE LIMITS FOR URINE DRUG SCREEN Drug Class                     Cutoff (ng/mL) Amphetamine and metabolites    1000 Barbiturate and metabolites    200 Benzodiazepine                 200 Opiates and metabolites        300 Cocaine and metabolites        300 THC                            50 Performed at [facility], [address], [place], [place] [address]   I-Stat CG4 Lactic Acid     Status: Abnormal   Collection Time: 09/07/23  5:25 PM  Result Value Ref Range   Lactic Acid, Venous 6.6 (HH) 0.5 - 1.9 mmol/L   Comment MD NOTIFIED, SUGGEST RECOLLECT   CBG monitoring, ED     Status: Abnormal   Collection Time: 09/07/23  6:26 PM  Result Value Ref Range   Glucose-Capillary 186 (H) 70 - 99 mg/dL    Comment: Glucose reference range applies only to samples taken after fasting for at least 8 hours.   Recent Results (from the past 240 hours)  Resp panel by RT-PCR (RSV, Flu A&B, Covid)  Anterior Nasal Swab     Status: None   Collection Time: 09/07/23  9:07 AM   Specimen: Anterior Nasal Swab  Result Value Ref Range Status   SARS Coronavirus 2 by RT PCR NEGATIVE NEGATIVE Final   Influenza A by PCR NEGATIVE NEGATIVE Final   Influenza B by PCR NEGATIVE NEGATIVE Final    Comment: (NOTE) The Xpert Xpress SARS-CoV-2/FLU/RSV plus assay is intended as an aid in the diagnosis of influenza from Nasopharyngeal swab specimens and should not be used as a sole basis for treatment. Nasal washings and aspirates are unacceptable for Xpert Xpress SARS-CoV-2/FLU/RSV testing.  Fact Sheet for Patients: BloggerCourse.com  Fact Sheet for Healthcare Providers: SeriousBroker.it  This test is not yet approved or cleared by the [place]  FDA and has been authorized for detection and/or diagnosis of SARS-CoV-2 by FDA under an Emergency Use Authorization (EUA). This EUA will remain in effect (meaning this test can be used) for the duration of the COVID-19 declaration under Section 564(b)(1) of the Act, 21 U.S.C. section 360bbb-3(b)(1), unless the authorization is terminated or revoked.     Resp Syncytial Virus by PCR NEGATIVE NEGATIVE Final    Comment: (NOTE) Fact Sheet for Patients: BloggerCourse.com  Fact Sheet for Healthcare Providers: SeriousBroker.it  This test is not yet approved or cleared by the [place]  FDA and has been authorized for detection and/or diagnosis of SARS-CoV-2 by FDA under an Emergency Use Authorization (EUA). This EUA will remain in effect (meaning this test can be used) for the duration of the COVID-19 declaration under Section 564(b)(1) of the Act, 21 U.S.C. section 360bbb-3(b)(1), unless the authorization is terminated or revoked.  Performed at [facility], [address] [place], [place] [address]    Creatinine: Recent Labs     09/07/23 [phone]  CREATININE 2.23*   Baseline Creatinine: 1  Impression/Assessment:  48yo with a left ureteral stone, sepsis  Plan:  -We discussed the management of kidney stones. These options include observation, ureteroscopy, shockwave lithotripsy (ESWL) and percutaneous nephrolithotomy (PCNL). We discussed which options are relevant to the patient's stone(s). We discussed the natural history of kidney stones as well as the complications of untreated stones and the impact on quality of life without treatment as well as with each of the above listed treatments. We also discussed the efficacy of each treatment in its ability to clear the stone burden. With any of these management options I discussed the signs and symptoms of infection and the need for emergent treatment should these be experienced. For each option we discussed the ability of each procedure to clear the patient of their stone burden.   For observation I described the risks which include but are not limited to silent renal damage, life-threatening infection, need for emergent surgery, failure to pass stone and pain.   For ureteroscopy I described the risks which include bleeding, infection, damage to contiguous structures, positioning injury, ureteral stricture, ureteral avulsion, ureteral injury, need for prolonged ureteral stent, inability to perform ureteroscopy, need for an interval procedure, inability to clear stone burden, stent discomfort/pain, heart attack, stroke, pulmonary embolus and the inherent risks with general anesthesia.   For shockwave lithotripsy I described the risks which include arrhythmia, kidney contusion, kidney hemorrhage, need for transfusion, pain, inability to adequately break up stone, inability to pass stone fragments, Steinstrasse, infection associated with obstructing stones, need for alternate surgical procedure, need for repeat shockwave lithotripsy, MI, CVA, PE and the inherent risks with  anesthesia/conscious sedation.   For PCNL I described the risks including positioning injury, pneumothorax, hydrothorax, need for chest tube, inability to clear stone burden, renal laceration, arterial venous fistula or malformation, need for embolization of kidney, loss of kidney or renal function, need for repeat procedure, need for prolonged nephrostomy tube, ureteral avulsion, MI, CVA, PE and the inherent risks of general anesthesia.   - The patient would like to proceed with left ureteral stent placement  Belvie Clara 09/07/2023, 6:41 PM

## 2023-09-07 NOTE — Op Note (Signed)
.  Preoperative diagnosis: Left ureteral stone, sepsis  Postoperative diagnosis: Same  Procedure: 1 cystoscopy 2. Left retrograde pyelography 3.  Intraoperative fluoroscopy, under one hour, with interpretation 4. Left 6 x 26 JJ stent placement  Attending: Belvie Clara  Anesthesia: General  Estimated blood loss: None  Drains: Left 6 x 26 JJ ureteral stent without tether, 16 French foley catheter  Specimens: none  Antibiotics: vancomycin   Findings: left mid ureteral stone. Moderate hydronephrosis. No masses/lesions in the bladder. Ureteral orifices in normal anatomic location.  Indications: Patient is a 48 year old female with a history of left ureteral stone and concern for sepsis.  After discussing treatment options, they decided proceed with left stent placement.  Procedure in detail: The patient was brought to the operating room and a brief timeout was done to ensure correct patient, correct procedure, correct site.  General anesthesia was administered patient was placed in dorsal lithotomy position.  Their genitalia was then prepped and draped in usual sterile fashion.  A rigid 22 French cystoscope was passed in the urethra and the bladder.  Bladder was inspected free masses or lesions.  the ureteral orifices were in the normal orthotopic locations.  a 6 french ureteral catheter was then instilled into the left ureteral orifice.  a gentle retrograde was obtained and findings noted above.  we then placed a zip wire through the ureteral catheter and advanced up to the renal pelvis.    We then placed a 6 x 26 double-j ureteral stent over the original zip wire.  We then removed the wire and good coil was noted in the the renal pelvis under fluoroscopy and the bladder under direct vision.  A foley catheter was then placed. the bladder was then drained and this concluded the procedure which was well tolerated by patient.  Complications: None  Condition: Stable, extubated, transferred to  PACU  Plan: Patient is to be admitted for IV antibiotics. She will have his stone extraction in 2 weeks.

## 2023-09-07 NOTE — ED Provider Notes (Signed)
 [facility]URO/[facility]AU[facility]SURGICAL ICU Provider Note  CSN: [id number] Arrival date & time: 09/07/23 9141  Chief Complaint(s) Weakness  HPI PEARLY BARTOSIK is a 48 y.o. female with past medical history as below, significant for DM2, GERD, HLD, obesity, hypothyroid who presents to the ED with complaint of generalized weakness x 3 days  Patient reports she has not been eating or drinking over the past few days, she has been feeling weak, tired, dizzy/lightheaded upon standing.  She has some exertional dyspnea but no chest pain, no abdominal pain nausea or vomiting, no change in bowel or bladder function.  She takes Mounjaro  for her diabetes, no recent adjustments.  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  Past Medical History Past Medical History:  Diagnosis Date   Arthritis    Asthma    DM2 (diabetes mellitus, type 2) (HCC)    GERD (gastroesophageal reflux disease)    History of kidney stones    Hypercholesteremia    Hypothyroidism    Morbid obesity (HCC)    Patient Active Problem List   Diagnosis Date Noted   Sepsis (HCC) 09/07/2023   Ureteral calculus 12/19/2021   Acute UTI 12/19/2021   Lactic acidosis 12/19/2021   AKI (acute kidney injury) (HCC) 12/19/2021   Acute hyponatremia 12/19/2021   Hypokalemia 12/19/2021   DM2 (diabetes mellitus, type 2) (HCC) 11/18/2018   Morbid obesity (HCC) 11/18/2018   Acquired hypothyroidism 06/02/2017   Home Medication(s) Prior to Admission medications   Medication Sig Start Date End Date Taking? Authorizing Provider  acetaminophen  (TYLENOL ) 500 MG tablet Take 1,000 mg by mouth every 6 (six) hours as needed for fever or moderate pain.   Yes [provider]  albuterol  (VENTOLIN  HFA) 108 (90 Base) MCG/ACT inhaler Inhale 2 puffs into the lungs as needed for wheezing or shortness of breath.   Yes [provider]  atorvastatin  (LIPITOR) 20 MG tablet Take 1 tablet (20 mg total) by mouth daily. 04/17/22  Yes  [place], MD  CARBOXYMETHYLCELLUL-GLYCERIN OP Place 1 drop into both eyes as needed (dry eyes).   Yes [provider]  famotidine  (PEPCID ) 20 MG tablet Take 20 mg by mouth 2 (two) times daily as needed for heartburn or indigestion.   Yes [provider]  fluticasone (FLONASE) 50 MCG/ACT nasal spray Place 1 spray into both nostrils as needed for allergies.   Yes [provider]  glimepiride  (AMARYL ) 2 MG tablet Take 1 tablet (2 mg total) by mouth daily before supper. 11/07/22  Yes Thapa, [place], MD  indapamide (LOZOL) 1.25 MG tablet Take 1.25 mg by mouth daily.   Yes [provider]  levothyroxine  (SYNTHROID ) 200 MCG tablet Take 1 tablet (200 mcg total) by mouth daily before breakfast. 1 TABLET BEFORE BREAKFAST DAILY EXCEPT HALF TABLET ON SUNDAYS Patient taking differently: Take 200 mcg by mouth daily. 07/16/23  Yes Thapa, [place], MD  lisinopril  (ZESTRIL ) 20 MG tablet Take 20 mg by mouth every evening.   Yes [provider]  magnesium  oxide (MAG-OX) 400 (240 Mg) MG tablet Take 400 mg by mouth at bedtime.   Yes [provider]  metFORMIN  (GLUCOPHAGE ) 500 MG tablet Take 2 tablets (1,000 mg total) by mouth 2 (two) times daily with a meal. 01/20/23  Yes Thapa, [place], MD  oxybutynin  (DITROPAN ) 5 MG tablet Take 1 tablet (5 mg total) by mouth every 8 (eight) hours as needed for bladder spasms. 12/20/21  Yes Watt Rush, MD  tirzepatide  (MOUNJARO ) 12.5 MG/0.5ML  Pen Inject 12.5 mg into the skin once a week. 07/16/23  Yes Thapa, [place], MD  predniSONE  (DELTASONE ) 20 MG tablet 3 tablets once a day for 5 days, 2 tablets once a day for 5 days, 1 tablet once a day for 5 days Orally; Duration: 15 days Patient not taking: Reported on 09/07/2023 08/10/23   [provider]                                                                                                                                    Past Surgical History Past Surgical History:  Procedure  Laterality Date   CHOLECYSTECTOMY     COLONOSCOPY WITH PROPOFOL  N/A 11/25/2022   Procedure: COLONOSCOPY WITH PROPOFOL ;  Surgeon: Saintclair Jasper, MD;  Location: [facility];  Service: Gastroenterology;  Laterality: N/A;   CYSTOSCOPY W/ URETERAL STENT PLACEMENT Left 12/19/2021   Procedure: CYSTOSCOPY WITH POSSIBLE LEFT RETROGRADE PYELOGRAM/URETERAL  AND   STENT PLACEMENT;  Surgeon: Watt Rush, MD;  [facility];  Service: Urology;  Laterality: Left;   CYSTOSCOPY W/ URETERAL STENT PLACEMENT Left 09/07/2023   Procedure: CYSTOSCOPY, WITH RETROGRADE PYELOGRAM AND URETERAL STENT INSERTION;  Surgeon: Sherrilee Belvie CROME, MD;  Location: [facility] OR;  Service: Urology;  Laterality: Left;  CYSTOSCOPY WITH LEFT RETROGRADE STENT PLACEMENT   CYSTOSCOPY/URETEROSCOPY/HOLMIUM LASER/STENT PLACEMENT Left 01/16/2022   Procedure: CYSTOSCOPY LEF TURETEROSCOPY/HOLMIUM LASER/STENT PLACEMENT;  Surgeon: Watt Rush, MD;  Location: [facility] ORS;  Service: Urology;  Laterality: Left;   POLYPECTOMY  11/25/2022   Procedure: POLYPECTOMY;  Surgeon: Saintclair Jasper, MD;  Location: [facility];  Service: Gastroenterology;;   WISDOM TOOTH EXTRACTION     Family History Family History  Problem Relation Age of Onset   Bladder Cancer Mother        60s   Prostate cancer Maternal Uncle    Lung cancer Paternal Aunt    Lung cancer Paternal Grandmother     Social History Social History   Tobacco Use   Smoking status: Former    Current packs/day: 0.00    Types: Cigarettes    Quit date: 01/20/2004    Years since quitting: 19.6   Smokeless tobacco: Never  Vaping Use   Vaping status: Never Used  Substance Use Topics   Alcohol use: No   Drug use: No   Allergies Bee pollen  Review of Systems A thorough review of systems was obtained and all systems are negative except as noted in the HPI and PMH.   Physical Exam Vital Signs  I have reviewed the triage vital signs BP 118/83   Pulse (!) 111   Temp 98.5 F (36.9 C) (Oral)   Resp  (!) 30   Ht 5' 7 (1.702 m)   Wt (!) 143.3 kg   LMP 02/04/2023 (Approximate)   SpO2 95%   BMI 49.49 kg/m  Physical Exam Vitals and nursing note reviewed.  Constitutional:      Appearance: Normal appearance.  She is obese. She is ill-appearing.  HENT:     Head: Normocephalic and atraumatic.     Right Ear: External ear normal.     Left Ear: External ear normal.     Nose: Nose normal.     Mouth/Throat:     Mouth: Mucous membranes are dry.  Eyes:     General: No scleral icterus.       Right eye: No discharge.        Left eye: No discharge.  Cardiovascular:     Rate and Rhythm: Regular rhythm. Tachycardia present.     Pulses: Normal pulses.     Heart sounds: Normal heart sounds.  Pulmonary:     Effort: Pulmonary effort is normal. No respiratory distress.     Breath sounds: Decreased air movement present. No stridor. Decreased breath sounds present.  Abdominal:     General: Abdomen is flat. There is no distension.     Palpations: Abdomen is soft.     Tenderness: There is no abdominal tenderness.  Musculoskeletal:     Cervical back: No rigidity.     Right lower leg: No edema.     Left lower leg: No edema.  Skin:    General: Skin is warm and dry.     Capillary Refill: Capillary refill takes less than 2 seconds.  Neurological:     Mental Status: She is alert.  Psychiatric:        Mood and Affect: Mood normal.        Behavior: Behavior normal. Behavior is cooperative.     ED Results and Treatments Labs (all labs ordered are listed, but only abnormal results are displayed) Labs Reviewed  BLOOD CULTURE ID PANEL (REFLEXED) - BCID2 - Abnormal; Notable for the following components:      Result Value   Enterobacterales DETECTED (*)    Escherichia coli DETECTED (*)    All other components within normal limits  CBC WITH DIFFERENTIAL/PLATELET - Abnormal; Notable for the following components:   WBC 18.4 (*)    Hemoglobin 11.3 (*)    HCT 35.5 (*)    Neutro Abs 15.8 (*)     Basophils Absolute 0.2 (*)    All other components within normal limits  COMPREHENSIVE METABOLIC PANEL WITH GFR - Abnormal; Notable for the following components:   Sodium 132 (*)    Chloride 95 (*)    CO2 18 (*)    Glucose, Bld 267 (*)    BUN 21 (*)    Creatinine, Ser 2.23 (*)    Calcium  8.4 (*)    Total Protein 6.2 (*)    Albumin  2.9 (*)    AST 50 (*)    GFR, Estimated 27 (*)    Anion gap 19 (*)    All other components within normal limits  URINALYSIS, ROUTINE W REFLEX MICROSCOPIC - Abnormal; Notable for the following components:   APPearance CLOUDY (*)    Hgb urine dipstick MODERATE (*)    Protein, ur 100 (*)    Leukocytes,Ua SMALL (*)    Bacteria, UA RARE (*)    All other components within normal limits  BETA-HYDROXYBUTYRIC ACID - Abnormal; Notable for the following components:   Beta-Hydroxybutyric Acid 0.38 (*)    All other components within normal limits  T4, FREE - Abnormal; Notable for the following components:   Free T4 1.19 (*)    All other components within normal limits  BRAIN NATRIURETIC PEPTIDE - Abnormal; Notable for the following components:  B Natriuretic Peptide 180.3 (*)    All other components within normal limits  CBC - Abnormal; Notable for the following components:   WBC 16.7 (*)    RBC 3.62 (*)    Hemoglobin 10.2 (*)    HCT 31.8 (*)    All other components within normal limits  CREATININE, SERUM - Abnormal; Notable for the following components:   Creatinine, Ser 1.69 (*)    GFR, Estimated 37 (*)    All other components within normal limits  CBC - Abnormal; Notable for the following components:   WBC 13.5 (*)    RBC 3.66 (*)    Hemoglobin 10.3 (*)    HCT 32.0 (*)    All other components within normal limits  BASIC METABOLIC PANEL WITH GFR - Abnormal; Notable for the following components:   CO2 20 (*)    Glucose, Bld 205 (*)    BUN 24 (*)    Creatinine, Ser 1.57 (*)    Calcium  8.0 (*)    GFR, Estimated 40 (*)    All other components within  normal limits  MAGNESIUM  - Abnormal; Notable for the following components:   Magnesium  1.3 (*)    All other components within normal limits  GLUCOSE, CAPILLARY - Abnormal; Notable for the following components:   Glucose-Capillary 177 (*)    All other components within normal limits  GLUCOSE, CAPILLARY - Abnormal; Notable for the following components:   Glucose-Capillary 179 (*)    All other components within normal limits  GLUCOSE, CAPILLARY - Abnormal; Notable for the following components:   Glucose-Capillary 202 (*)    All other components within normal limits  I-STAT VENOUS BLOOD GAS, ED - Abnormal; Notable for the following components:   pCO2, Ven 41.4 (*)    pO2, Ven 132 (*)    TCO2 21 (*)    Acid-base deficit 6.0 (*)    Sodium 132 (*)    Calcium , Ion 1.05 (*)    HCT 35.0 (*)    Hemoglobin 11.9 (*)    All other components within normal limits  CBG MONITORING, ED - Abnormal; Notable for the following components:   Glucose-Capillary 217 (*)    All other components within normal limits  I-STAT CG4 LACTIC ACID, ED - Abnormal; Notable for the following components:   Lactic Acid, Venous 8.0 (*)    All other components within normal limits  I-STAT CG4 LACTIC ACID, ED - Abnormal; Notable for the following components:   Lactic Acid, Venous 8.5 (*)    All other components within normal limits  I-STAT CG4 LACTIC ACID, ED - Abnormal; Notable for the following components:   Lactic Acid, Venous 6.6 (*)    All other components within normal limits  CBG MONITORING, ED - Abnormal; Notable for the following components:   Glucose-Capillary 186 (*)    All other components within normal limits  TROPONIN I (HIGH SENSITIVITY) - Abnormal; Notable for the following components:   Troponin I (High Sensitivity) 20 (*)    All other components within normal limits  RESP PANEL BY RT-PCR (RSV, FLU A&B, COVID)  RVPGX2  CULTURE, BLOOD (ROUTINE X 2)  CULTURE, BLOOD (ROUTINE X 2)  MRSA NEXT GEN BY PCR,  NASAL  LIPASE, BLOOD  RAPID URINE DRUG SCREEN, HOSP PERFORMED  OSMOLALITY  TSH  HIV ANTIBODY (ROUTINE TESTING W REFLEX)  PHOSPHORUS  URINALYSIS, W/ REFLEX TO CULTURE (INFECTION SUSPECTED)  I-STAT CG4 LACTIC ACID, ED  TROPONIN I (HIGH SENSITIVITY)  Radiology DG CHEST PORT 1 VIEW Result Date: 09/08/2023 CLINICAL DATA:  10026.  Shortness of breath and sepsis. EXAM: PORTABLE CHEST 1 VIEW COMPARISON:  Portable chest yesterday at 3:15 p.m. FINDINGS: 3:36 a.m. There is a right IJ central line terminating at the brachiocephalic/SVC junction. Overlying monitor wires. The lungs are near expiratory. There is linear perihilar atelectasis. No focal pneumonia is seen with limited view of the bases. No pleural effusion is evident. The cardiac size is normal. The mediastinum normally outlined. No new osseous findings. Overall aeration is unchanged.  No new abnormality. IMPRESSION: 1. No significant change from yesterday's study. 2. Low-inspiration study with no acute radiographic chest finding. 3. Right IJ central line terminates at the brachiocephalic/SVC junction. Electronically Signed   By: Francis Quam M.D.   On: 09/08/2023 03:53   DG C-Arm 1-60 Min Result Date: 09/07/2023 CLINICAL DATA:  Retrograde pyelogram stent insertion EXAM: DG C-ARM 1-60 MIN FLUOROSCOPY: Fluoroscopy Time:  5.3 seconds Radiation Exposure Index (if provided by the fluoroscopic device): 2.99 mGy Number of Acquired Spot Images: 1 COMPARISON:  CT 09/07/2023 FINDINGS: Only a single low resolution spot image of the left pelvis is submitted for interpretation. There is a catheter or possibly stent to the left of midline chromophobe the proximal or distal ends are non included. Correlate with findings at time of imaging. IMPRESSION: Intraoperative fluoroscopic assistance provided during pyelogram and stent insertion  Electronically Signed   By: Luke Bun M.D.   On: 09/07/2023 20:26   CT CHEST ABDOMEN PELVIS WO CONTRAST Result Date: 09/07/2023 CLINICAL DATA:  Sepsis EXAM: CT CHEST, ABDOMEN AND PELVIS WITHOUT CONTRAST TECHNIQUE: Multidetector CT imaging of the chest, abdomen and pelvis was performed following the standard protocol without IV contrast. RADIATION DOSE REDUCTION: This exam was performed according to the departmental dose-optimization program which includes automated exposure control, adjustment of the mA and/or kV according to patient size and/or use of iterative reconstruction technique. COMPARISON:  December 19, 2021 FINDINGS: Of note, the lack of intravenous contrast limits evaluation of the solid organ parenchyma and vascularity. CT CHEST FINDINGS Cardiovascular: No cardiomegaly or pericardial effusion.No aortic aneurysm. Right IJ approach central venous catheter terminates at the right subclavian and IJ confluence. Mediastinum/Nodes: No mediastinal mass.No mediastinal, hilar, or axillary lymphadenopathy. Lungs/Pleura: The midline trachea and bronchi are patent. No focal airspace consolidation, pleural effusion, or pneumothorax. CT ABDOMEN PELVIS FINDINGS Hepatobiliary: No mass.Diffuse hepatic steatosis.Cholecystectomy. No intrahepatic or extrahepatic biliary ductal dilation. Pancreas: No mass or main ductal dilation. No peripancreatic inflammation or fluid collection. Spleen: Normal size. No mass. Adrenals/Urinary Tract: No adrenal masses. No renal mass. 2 x 3 x 3 mm calculus in the distal left ureter causing mild left-sided hydroureteronephrosis. The left kidney is edematous with perinephric stranding. The urinary bladder is completely decompressed. Stomach/Bowel: The stomach contains ingested material without focal abnormality. No small bowel wall thickening or inflammation. No small bowel obstruction.Normal appendix. Vascular/Lymphatic: No aortic aneurysm. No intraabdominal or pelvic lymphadenopathy.  Reproductive: The uterus and ovaries are within normal limits for patient's age. No free pelvic fluid. Other: No pneumoperitoneum, ascites, or mesenteric inflammation. Musculoskeletal: No acute fracture or destructive lesion. Asymmetric soft tissue in the lateral left breast (for example, axial 22). IMPRESSION: 1. In the distal left ureter, there is a 2 x 3 x 3 mm calculus, causing mild left-sided hydroureteronephrosis. Correlation with urinalysis recommended. 2. No pneumonia, pulmonary edema, or pleural effusion. 3. Asymmetric soft tissue in the lateral left breast (for example, axial image 22). While this likely represents asymmetric  glandular breast tissue, nonemergent follow-up with mammography recommended to exclude underlying breast lesion. Electronically Signed   By: Rogelia Myers M.D.   On: 09/07/2023 16:46   DG Chest Portable 1 View Result Date: 09/07/2023 CLINICAL DATA:  Central venous catheter placement EXAM: PORTABLE CHEST 1 VIEW COMPARISON:  1825 FINDINGS: Single frontal view of the chest demonstrates right internal jugular central venous catheter, tip overlying the right brachiocephalic confluence. Cardiac silhouette is stable. No acute airspace disease, effusion, or pneumothorax. No acute bony abnormalities. IMPRESSION: 1. Right internal jugular catheter, tip overlying the right brachiocephalic confluence. 2. No acute intrathoracic process. Electronically Signed   By: Ozell Daring M.D.   On: 09/07/2023 15:45   DG Chest Portable 1 View Result Date: 09/07/2023 CLINICAL DATA:  Generalized weakness. EXAM: PORTABLE CHEST 1 VIEW COMPARISON:  05/25/2023 FINDINGS: The lungs are clear without focal pneumonia, edema, pneumothorax or pleural effusion. The cardiopericardial silhouette is within normal limits for size. No acute bony abnormality. Telemetry leads overlie the chest. IMPRESSION: No active disease. Electronically Signed   By: Camellia Candle M.D.   On: 09/07/2023 09:42    Pertinent labs &  imaging results that were available during my care of the patient were reviewed by me and considered in my medical decision making (see MDM for details).  Medications Ordered in ED Medications  0.9 %  sodium chloride  infusion ( Intravenous Infusion Verify 09/08/23 0600)  norepinephrine  (LEVOPHED ) 4mg  in (0.016 mg/mL) premix infusion (8 mcg/min Intravenous Infusion Verify 09/08/23 0600)  vancomycin  (VANCOREADY) IVPB 2000 mg/400 mL ( Intravenous MAR Unhold 09/07/23 2146)  Chlorhexidine  Gluconate Cloth 2 % PADS 6 each (0 each Topical Hold 09/07/23 2210)  docusate sodium  (COLACE) capsule 100 mg ( Oral MAR Unhold 09/07/23 2146)  polyethylene glycol (MIRALAX  / GLYCOLAX ) packet 17 g ( Oral MAR Unhold 09/07/23 2146)  heparin  injection 5,000 Units (5,000 Units Subcutaneous Given 09/08/23 0507)  tamsulosin  (FLOMAX ) capsule 0.4 mg (0 mg Oral Hold 09/07/23 2210)  insulin  aspart (novoLOG ) injection 0-9 Units (2 Units Subcutaneous Given 09/08/23 0455)  levothyroxine  (SYNTHROID ) tablet 200 mcg (200 mcg Oral Given 09/08/23 0508)  acetaminophen  (TYLENOL ) tablet 650 mg ( Oral MAR Unhold 09/07/23 2146)  oxyCODONE  (Oxy IR/ROXICODONE ) immediate release tablet 5 mg ( Oral MAR Unhold 09/07/23 2146)  chlorhexidine  (PERIDEX ) 0.12 % solution (  Canceled Entry 09/07/23 2211)  cefTRIAXone  (ROCEPHIN ) 2 g in sodium chloride  0.9 % 100 mL IVPB (0 g Intravenous Stopped 09/08/23 0536)  lactated ringers  bolus 2,000 mL (0 mLs Intravenous Stopped 09/07/23 1643)  lactated ringers  bolus 1,000 mL (0 mLs Intravenous Stopped 09/07/23 1759)  ceFEPIme  (MAXIPIME ) 2 g in sodium chloride  0.9 % 100 mL IVPB (0 g Intravenous Stopped 09/07/23 2209)  metroNIDAZOLE  (FLAGYL ) IVPB 500 mg (0 mg Intravenous Stopped 09/07/23 2210)  acetaminophen  (OFIRMEV ) 10 MG/ML IV (  Override pull for Anesthesia 09/07/23 2000)  Procedures .Critical Care  Performed by: Elnor Jayson LABOR, DO Authorized by: Elnor Jayson LABOR, DO   Critical care provider statement:    Critical care time (minutes):  90   Critical care time was exclusive of:  Separately billable procedures and treating other patients   Critical care was necessary to treat or prevent imminent or life-threatening deterioration of the following conditions:  Circulatory failure   Critical care was time spent personally by me on the following activities:  Development of treatment plan with patient or surrogate, discussions with consultants, evaluation of patient's response to treatment, examination of patient, ordering and review of laboratory studies, ordering and review of radiographic studies, ordering and performing treatments and interventions, pulse oximetry, re-evaluation of patient's condition, review of old charts and obtaining history from patient or surrogate   Care discussed with: admitting provider   Central Line  Date/Time: 09/08/2023 7:14 AM  Performed by: Elnor Jayson LABOR, DO Authorized by: Elnor Jayson LABOR, DO   Consent:    Consent obtained:  Verbal and written   Consent given by:  Patient   Risks, benefits, and alternatives were discussed: yes     Risks discussed:  Arterial puncture, incorrect placement, nerve damage, bleeding, infection and pneumothorax   Alternatives discussed:  No treatment and delayed treatment Universal protocol:    Procedure explained and questions answered to patient or proxy's satisfaction: yes     Immediately prior to procedure, a time out was called: yes     Patient identity confirmed:  Verbally with patient and arm band Pre-procedure details:    Indication(s): central venous access and insufficient peripheral access     Hand hygiene: Hand hygiene performed prior to insertion     Sterile barrier technique: All elements of maximal sterile technique followed     Skin preparation:  Chlorhexidine    Skin preparation  agent: Skin preparation agent completely dried prior to procedure   Sedation:    Sedation type:  None Anesthesia:    Anesthesia method:  Local infiltration   Local anesthetic:  Lidocaine  1% w/o epi Procedure details:    Location:  R internal jugular   Patient position:  Trendelenburg   Procedural supplies:  Triple lumen   Landmarks identified: yes     Ultrasound guidance: yes     Ultrasound guidance timing: prior to insertion and real time     Sterile ultrasound techniques: Sterile gel and sterile probe covers were used     Number of attempts:  1   Successful placement: yes   Post-procedure details:    Post-procedure:  Dressing applied and line sutured   Assessment:  Blood return through all ports, no pneumothorax on x-ray, placement verified by x-ray and free fluid flow   Procedure completion:  Tolerated well, no immediate complications   (including critical care time)  Medical Decision Making / ED Course    Medical Decision Making:    LALIA LOUDON is a 48 y.o. female with past medical history as below, significant for DM2, GERD, HLD, obesity, hypothyroid who presents to the ED with complaint of generalized weakness x 3 days. The complaint involves an extensive differential diagnosis and also carries with it a high risk of complications and morbidity.  Serious etiology was considered. Ddx includes but is not limited to: In my evaluation of this patient's dyspnea my DDx includes, but is not limited to, pneumonia, pulmonary embolism, pneumothorax, pulmonary edema, metabolic acidosis, asthma, COPD, cardiac cause, anemia, anxiety, hypovolemia, myxedema, infection, DKA, HHS etc.  Complete initial physical exam performed, notably the patient was in mild distress, hypotensive, tachycardic.    Reviewed and confirmed nursing documentation for past medical history, family history, social history.  Vital signs reviewed.    Generalized weakness> - likely at least in part 2/2  dehydration/AKI  AKI > - she reports poor PO last 2-3 days - BUN 21, Cr 2.23; likely pre-renal, continue IVF - poor IV access > iv team unable to place > place CVC  Shock, pos septic vs hypovolemic > - persistent hypotension despite IVF, LA 8.5, wbc 18, unclear source of infection - start undiff abx, send ctx - she was given 3L LR, now on levophed , I placed RIJ CVC - CTAP with small stone in distal ureter, pt unable to void yet, recommend uro consult     Clinical Course as of 09/08/23 0727  Mon Sep 07, 2023  9052 Lactic Acid, Venous(!!): 8.0 Metformin  possible factor, infection seems more likely at this time but no clear source identified at this time  [SG]  1658 PCCM to see in consult  [SG]    Clinical Course User Index [SG] Elnor Jayson LABOR, DO     Admit PCCM               Additional history obtained: -Additional history obtained from ems -External records from outside source obtained and reviewed including: Chart review including previous notes, labs, imaging, consultation notes including  Home medications Prior imaging    Lab Tests: -I ordered, reviewed, and interpreted labs.   The pertinent results include:   Labs Reviewed  BLOOD CULTURE ID PANEL (REFLEXED) - BCID2 - Abnormal; Notable for the following components:      Result Value   Enterobacterales DETECTED (*)    Escherichia coli DETECTED (*)    All other components within normal limits  CBC WITH DIFFERENTIAL/PLATELET - Abnormal; Notable for the following components:   WBC 18.4 (*)    Hemoglobin 11.3 (*)    HCT 35.5 (*)    Neutro Abs 15.8 (*)    Basophils Absolute 0.2 (*)    All other components within normal limits  COMPREHENSIVE METABOLIC PANEL WITH GFR - Abnormal; Notable for the following components:   Sodium 132 (*)    Chloride 95 (*)    CO2 18 (*)    Glucose, Bld 267 (*)    BUN 21 (*)    Creatinine, Ser 2.23 (*)    Calcium  8.4 (*)    Total Protein 6.2 (*)    Albumin  2.9 (*)    AST  50 (*)    GFR, Estimated 27 (*)    Anion gap 19 (*)    All other components within normal limits  URINALYSIS, ROUTINE W REFLEX MICROSCOPIC - Abnormal; Notable for the following components:   APPearance CLOUDY (*)    Hgb urine dipstick MODERATE (*)    Protein, ur 100 (*)    Leukocytes,Ua SMALL (*)    Bacteria, UA RARE (*)    All other components within normal limits  BETA-HYDROXYBUTYRIC ACID - Abnormal; Notable for the following components:   Beta-Hydroxybutyric Acid 0.38 (*)    All other components within normal limits  T4, FREE - Abnormal; Notable for the following components:   Free T4 1.19 (*)    All other components within normal limits  BRAIN NATRIURETIC PEPTIDE - Abnormal; Notable for the following components:   B Natriuretic Peptide 180.3 (*)    All other components within normal limits  CBC - Abnormal;  Notable for the following components:   WBC 16.7 (*)    RBC 3.62 (*)    Hemoglobin 10.2 (*)    HCT 31.8 (*)    All other components within normal limits  CREATININE, SERUM - Abnormal; Notable for the following components:   Creatinine, Ser 1.69 (*)    GFR, Estimated 37 (*)    All other components within normal limits  CBC - Abnormal; Notable for the following components:   WBC 13.5 (*)    RBC 3.66 (*)    Hemoglobin 10.3 (*)    HCT 32.0 (*)    All other components within normal limits  BASIC METABOLIC PANEL WITH GFR - Abnormal; Notable for the following components:   CO2 20 (*)    Glucose, Bld 205 (*)    BUN 24 (*)    Creatinine, Ser 1.57 (*)    Calcium  8.0 (*)    GFR, Estimated 40 (*)    All other components within normal limits  MAGNESIUM  - Abnormal; Notable for the following components:   Magnesium  1.3 (*)    All other components within normal limits  GLUCOSE, CAPILLARY - Abnormal; Notable for the following components:   Glucose-Capillary 177 (*)    All other components within normal limits  GLUCOSE, CAPILLARY - Abnormal; Notable for the following components:    Glucose-Capillary 179 (*)    All other components within normal limits  GLUCOSE, CAPILLARY - Abnormal; Notable for the following components:   Glucose-Capillary 202 (*)    All other components within normal limits  I-STAT VENOUS BLOOD GAS, ED - Abnormal; Notable for the following components:   pCO2, Ven 41.4 (*)    pO2, Ven 132 (*)    TCO2 21 (*)    Acid-base deficit 6.0 (*)    Sodium 132 (*)    Calcium , Ion 1.05 (*)    HCT 35.0 (*)    Hemoglobin 11.9 (*)    All other components within normal limits  CBG MONITORING, ED - Abnormal; Notable for the following components:   Glucose-Capillary 217 (*)    All other components within normal limits  I-STAT CG4 LACTIC ACID, ED - Abnormal; Notable for the following components:   Lactic Acid, Venous 8.0 (*)    All other components within normal limits  I-STAT CG4 LACTIC ACID, ED - Abnormal; Notable for the following components:   Lactic Acid, Venous 8.5 (*)    All other components within normal limits  I-STAT CG4 LACTIC ACID, ED - Abnormal; Notable for the following components:   Lactic Acid, Venous 6.6 (*)    All other components within normal limits  CBG MONITORING, ED - Abnormal; Notable for the following components:   Glucose-Capillary 186 (*)    All other components within normal limits  TROPONIN I (HIGH SENSITIVITY) - Abnormal; Notable for the following components:   Troponin I (High Sensitivity) 20 (*)    All other components within normal limits  RESP PANEL BY RT-PCR (RSV, FLU A&B, COVID)  RVPGX2  CULTURE, BLOOD (ROUTINE X 2)  CULTURE, BLOOD (ROUTINE X 2)  MRSA NEXT GEN BY PCR, NASAL  LIPASE, BLOOD  RAPID URINE DRUG SCREEN, HOSP PERFORMED  OSMOLALITY  TSH  HIV ANTIBODY (ROUTINE TESTING W REFLEX)  PHOSPHORUS  URINALYSIS, W/ REFLEX TO CULTURE (INFECTION SUSPECTED)  I-STAT CG4 LACTIC ACID, ED  TROPONIN I (HIGH SENSITIVITY)    Notable for Cr elev,   EKG   EKG Interpretation Date/Time:  Monday September 07 2023 09:23:45  EDT  Ventricular Rate:  128 PR Interval:  141 QRS Duration:  81 QT Interval:  298 QTC Calculation: 435 R Axis:   47  Text Interpretation: Sinus tachycardia with irregular rate Confirmed by Elnor Savant ([id number]) on 09/07/2023 10:12:02 AM         Imaging Studies ordered: I ordered imaging studies including CT c/a/p, cxr I independently visualized the following imaging with scope of interpretation limited to determining acute life threatening conditions related to emergency care; findings noted above I agree with the radiologist interpretation If any imaging was obtained with contrast I closely monitored patient for any possible adverse reaction a/w contrast administration in the emergency department   Medicines ordered and prescription drug management: Meds ordered this encounter  Medications   lactated ringers  bolus 2,000 mL   lactated ringers  bolus 1,000 mL   0.9 %  sodium chloride  infusion   norepinephrine  (LEVOPHED ) 4mg  in (0.016 mg/mL) premix infusion    IV Access:   Peripheral   ceFEPIme  (MAXIPIME ) 2 g in sodium chloride  0.9 % 100 mL IVPB    Antibiotic Indication::   Other Indication (list below)    Other Indication::   Unknown Source.   metroNIDAZOLE  (FLAGYL ) IVPB 500 mg    Antibiotic Indication::   Other Indication (list below)    Other Indication::   Unknown Source.   DISCONTD: vancomycin  (VANCOCIN ) IVPB 1000 mg/200 mL premix    Indication::   Other Indication (list below)    Other Indication::   Unknown Source.   vancomycin  (VANCOREADY) IVPB 2000 mg/400 mL    Indication::   Other Indication (list below)    Other Indication::   Unknown Source.   Chlorhexidine  Gluconate Cloth 2 % PADS 6 each   docusate sodium  (COLACE) capsule 100 mg   polyethylene glycol (MIRALAX  / GLYCOLAX ) packet 17 g   heparin  injection 5,000 Units   tamsulosin  (FLOMAX ) capsule 0.4 mg   insulin  aspart (novoLOG ) injection 0-9 Units    Correction coverage::   Sensitive (thin, NPO, renal)     CBG < 70::   Implement Hypoglycemia Standing Orders and refer to Hypoglycemia Standing Orders sidebar report    CBG 70 - 120::   0 units    CBG 121 - 150::   1 unit    CBG 151 - 200::   2 units    CBG 201 - 250::   3 units    CBG 251 - 300::   5 units    CBG 301 - 350::   7 units    CBG 351 - 400:   9 units    CBG > 400:   call MD and obtain STAT lab verification   levothyroxine  (SYNTHROID ) tablet 200 mcg   acetaminophen  (TYLENOL ) tablet 650 mg   oxyCODONE  (Oxy IR/ROXICODONE ) immediate release tablet 5 mg    Refill:  0   chlorhexidine  (PERIDEX ) 0.12 % solution    THOMPSON, LUISA N: cabinet override   DISCONTD: lactated ringers  infusion   DISCONTD: chlorhexidine  (PERIDEX ) 0.12 % solution 15 mL   DISCONTD: Oral care mouth rinse   acetaminophen  (OFIRMEV ) 10 MG/ML IV    Flowers, Rokoshi T: cabinet override   DISCONTD: fentaNYL  (SUBLIMAZE ) injection 25-50 mcg   DISCONTD: ondansetron  (ZOFRAN ) injection 4 mg   DISCONTD: iohexol  (OMNIPAQUE ) 300 MG/ML solution   DISCONTD: water  for irrigation, sterile for irrigation SOLN   DISCONTD: 0.9 % irrigation (POUR BTL)   DISCONTD: lactated ringers  bolus 500 mL   cefTRIAXone  (ROCEPHIN ) 2 g in sodium chloride   0.9 % 100 mL IVPB    Antibiotic Indication::   Bacteremia    -I have reviewed the patients home medicines and have made adjustments as needed   Consultations Obtained: I requested consultation with the PCCM,  and discussed lab and imaging findings as well as pertinent plan    Cardiac Monitoring: The patient was maintained on a cardiac monitor.  I personally viewed and interpreted the cardiac monitored which showed an underlying rhythm of: Sinus tachycardia Continuous pulse oximetry interpreted by myself, 94% on RA.    Social Determinants of Health:  Diagnosis or treatment significantly limited by social determinants of health: former smoker and obesity   Reevaluation: After the interventions noted above, I reevaluated the patient and  found that they have improved  Co morbidities that complicate the patient evaluation  Past Medical History:  Diagnosis Date   Arthritis    Asthma    DM2 (diabetes mellitus, type 2) (HCC)    GERD (gastroesophageal reflux disease)    History of kidney stones    Hypercholesteremia    Hypothyroidism    Morbid obesity (HCC)       Dispostion: Disposition decision including need for hospitalization was considered, and patient admitted to the hospital.    Final Clinical Impression(s) / ED Diagnoses Final diagnoses:  AKI (acute kidney injury) (HCC)  Shock (HCC)  Nephrolithiasis  Sepsis, due to unspecified organism, unspecified whether acute organ dysfunction present (HCC)        Elnor Jayson LABOR, DO 09/08/23 [id number]

## 2023-09-07 NOTE — Anesthesia Preprocedure Evaluation (Addendum)
 Anesthesia Evaluation  Patient identified by MRN, date of birth, ID band Patient awake    Reviewed: Allergy & Precautions, NPO status , Patient's Chart, lab work & pertinent test results  Airway Mallampati: III  TM Distance: >3 FB Neck ROM: Full    Dental  (+) Teeth Intact, Dental Advisory Given   Pulmonary asthma , former smoker  Tachypnic   breath sounds clear to auscultation  unstable     Cardiovascular hypertension, Pt. on medications  Rhythm:Regular Rate:Tachycardia  Sepsis requiring levophed  infusion    Neuro/Psych negative neurological ROS     GI/Hepatic Neg liver ROS,GERD  Medicated,,  Endo/Other  diabetes, Type 2, Oral Hypoglycemic AgentsHypothyroidism  Class 3 obesity  Renal/GU Renal disease (AKI,  LEFT URETRO STONE with SEPSIS)     Musculoskeletal  (+) Arthritis ,    Abdominal   Peds  Hematology  (+) Blood dyscrasia, anemia   Anesthesia Other Findings Day of surgery medications reviewed with the patient.  Reproductive/Obstetrics                              Anesthesia Physical Anesthesia Plan  ASA: 4 and emergent  Anesthesia Plan: General   Post-op Pain Management: Ofirmev  IV (intra-op)*   Induction: Intravenous, Rapid sequence and Cricoid pressure planned  PONV Risk Score and Plan: 4 or greater and Scopolamine patch - Pre-op, Dexamethasone  and Ondansetron   Airway Management Planned: Oral ETT  Additional Equipment: Arterial line  Intra-op Plan:   Post-operative Plan: Possible Post-op intubation/ventilation  Informed Consent: I have reviewed the patients History and Physical, chart, labs and discussed the procedure including the risks, benefits and alternatives for the proposed anesthesia with the patient or authorized representative who has indicated his/her understanding and acceptance.     Dental advisory given  Plan Discussed with: CRNA and  Anesthesiologist  Anesthesia Plan Comments: (Discussed possible post-op intubation due to tachycardia, tachpnea and increased work of breathing, hypotension requiring levophed  with patient and patient's husband at bedside.  R internal jugular CVL placed prior to arrival in pre-op.)         Anesthesia Quick Evaluation

## 2023-09-07 NOTE — ED Triage Notes (Signed)
 Pt BIB [facility] from work. Pt endorses genarlized weakness x 3 days, tachy in the 120's. Hx of DM did not take her Insulin  yesterday. As per EMS BGS elevated.

## 2023-09-07 NOTE — ED Notes (Signed)
 CCM called for tele admit.

## 2023-09-07 NOTE — Anesthesia Postprocedure Evaluation (Signed)
 Anesthesia Post Note  Patient: Joan Walker  Procedure(s) Performed: CYSTOSCOPY, WITH RETROGRADE PYELOGRAM AND URETERAL STENT INSERTION (Left)     Patient location during evaluation: PACU Anesthesia Type: General Level of consciousness: awake and alert Pain management: pain level controlled Vital Signs Assessment: post-procedure vital signs reviewed and stable Respiratory status: spontaneous breathing, nonlabored ventilation, respiratory function stable and patient connected to nasal cannula oxygen Cardiovascular status: blood pressure returned to baseline and unstable (Requiring levophed  infusion for BP support) Postop Assessment: no apparent nausea or vomiting Anesthetic complications: no   No notable events documented.  Last Vitals:  Vitals:   09/07/23 2115 09/07/23 2200  BP: 100/62   Pulse: 99   Resp: (!) 25   Temp: 36.9 C 36.6 C  SpO2: 94%     Last Pain:  Vitals:   09/07/23 2200  TempSrc: Oral  PainSc:                  Garnette FORBES Skillern

## 2023-09-07 NOTE — Progress Notes (Signed)
 Elink is following code sepsis.

## 2023-09-08 ENCOUNTER — Inpatient Hospital Stay (HOSPITAL_COMMUNITY)

## 2023-09-08 ENCOUNTER — Encounter (HOSPITAL_COMMUNITY): Payer: Self-pay | Admitting: Urology

## 2023-09-08 DIAGNOSIS — I9589 Other hypotension: Secondary | ICD-10-CM | POA: Diagnosis not present

## 2023-09-08 DIAGNOSIS — R6521 Severe sepsis with septic shock: Secondary | ICD-10-CM | POA: Diagnosis not present

## 2023-09-08 DIAGNOSIS — A419 Sepsis, unspecified organism: Secondary | ICD-10-CM | POA: Diagnosis not present

## 2023-09-08 DIAGNOSIS — N179 Acute kidney failure, unspecified: Secondary | ICD-10-CM | POA: Diagnosis not present

## 2023-09-08 DIAGNOSIS — N200 Calculus of kidney: Secondary | ICD-10-CM | POA: Diagnosis not present

## 2023-09-08 LAB — MAGNESIUM: Magnesium: 1.3 mg/dL — ABNORMAL LOW (ref 1.7–2.4)

## 2023-09-08 LAB — GLUCOSE, CAPILLARY
Glucose-Capillary: 179 mg/dL — ABNORMAL HIGH (ref 70–99)
Glucose-Capillary: 185 mg/dL — ABNORMAL HIGH (ref 70–99)
Glucose-Capillary: 198 mg/dL — ABNORMAL HIGH (ref 70–99)
Glucose-Capillary: 202 mg/dL — ABNORMAL HIGH (ref 70–99)
Glucose-Capillary: 217 mg/dL — ABNORMAL HIGH (ref 70–99)
Glucose-Capillary: 234 mg/dL — ABNORMAL HIGH (ref 70–99)
Glucose-Capillary: 271 mg/dL — ABNORMAL HIGH (ref 70–99)

## 2023-09-08 LAB — CBC
HCT: 32 % — ABNORMAL LOW (ref 36.0–46.0)
Hemoglobin: 10.3 g/dL — ABNORMAL LOW (ref 12.0–15.0)
MCH: 28.1 pg (ref 26.0–34.0)
MCHC: 32.2 g/dL (ref 30.0–36.0)
MCV: 87.4 fL (ref 80.0–100.0)
Platelets: 188 K/uL (ref 150–400)
RBC: 3.66 MIL/uL — ABNORMAL LOW (ref 3.87–5.11)
RDW: 15.2 % (ref 11.5–15.5)
WBC: 13.5 K/uL — ABNORMAL HIGH (ref 4.0–10.5)
nRBC: 0 % (ref 0.0–0.2)

## 2023-09-08 LAB — BLOOD CULTURE ID PANEL (REFLEXED) - BCID2

## 2023-09-08 LAB — BASIC METABOLIC PANEL WITH GFR
Anion gap: 15 (ref 5–15)
BUN: 24 mg/dL — ABNORMAL HIGH (ref 6–20)
CO2: 20 mmol/L — ABNORMAL LOW (ref 22–32)
Calcium: 8 mg/dL — ABNORMAL LOW (ref 8.9–10.3)
Chloride: 100 mmol/L (ref 98–111)
Creatinine, Ser: 1.57 mg/dL — ABNORMAL HIGH (ref 0.44–1.00)
GFR, Estimated: 40 mL/min — ABNORMAL LOW (ref >60)
Glucose, Bld: 205 mg/dL — ABNORMAL HIGH (ref 70–99)
Potassium: 3.5 mmol/L (ref 3.5–5.1)
Sodium: 135 mmol/L (ref 135–145)

## 2023-09-08 LAB — ECHOCARDIOGRAM COMPLETE
Area-P 1/2: 3.31 cm2
Calc EF: 57.2 %
Height: 67 in
S' Lateral: 2.86 cm
Single Plane A2C EF: 60.6 %
Single Plane A4C EF: 59.7 %
Weight: 5056 [oz_av]

## 2023-09-08 LAB — PHOSPHORUS: Phosphorus: 3.8 mg/dL (ref 2.5–4.6)

## 2023-09-08 LAB — LACTIC ACID, PLASMA
Lactic Acid, Venous: 3.1 mmol/L (ref 0.5–1.9)
Lactic Acid, Venous: 2.7 mmol/L (ref 0.5–1.9)

## 2023-09-08 LAB — MRSA NEXT GEN BY PCR, NASAL: MRSA by PCR Next Gen: NOT DETECTED

## 2023-09-08 MED ORDER — DEXMEDETOMIDINE HCL IN NACL 400 MCG/100ML IV SOLN
0.0000 ug/kg/h | INTRAVENOUS | Status: DC
  Administered 2023-09-08 – 2023-09-09 (×4): 0.4 ug/kg/h via INTRAVENOUS
  Filled 2023-09-08 (×4): qty 100

## 2023-09-08 MED ORDER — SODIUM CHLORIDE 0.9 % IV SOLN
2.0000 g | INTRAVENOUS | Status: DC
  Administered 2023-09-08 – 2023-09-11 (×4): 2 g via INTRAVENOUS
  Filled 2023-09-08 (×4): qty 20

## 2023-09-08 MED ORDER — POTASSIUM CHLORIDE CRYS ER 20 MEQ PO TBCR
40.0000 meq | EXTENDED_RELEASE_TABLET | Freq: Once | ORAL | Status: AC
  Administered 2023-09-08: 40 meq via ORAL
  Filled 2023-09-08: qty 2

## 2023-09-08 MED ORDER — HEPARIN SODIUM (PORCINE) 10000 UNIT/ML IJ SOLN
7500.0000 [IU] | Freq: Three times a day (TID) | INTRAMUSCULAR | Status: DC
  Administered 2023-09-08 – 2023-09-12 (×12): 7500 [IU] via SUBCUTANEOUS
  Filled 2023-09-08 (×13): qty 1

## 2023-09-08 MED ORDER — MORPHINE SULFATE (PF) 2 MG/ML IV SOLN: 1.0000 mg | INTRAVENOUS | Status: DC | PRN

## 2023-09-08 MED ORDER — INSULIN ASPART 100 UNIT/ML IJ SOLN
0.0000 [IU] | INTRAMUSCULAR | Status: DC
  Administered 2023-09-08: 5 [IU] via SUBCUTANEOUS
  Administered 2023-09-08: 8 [IU] via SUBCUTANEOUS
  Administered 2023-09-08: 5 [IU] via SUBCUTANEOUS
  Administered 2023-09-09: 3 [IU] via SUBCUTANEOUS
  Administered 2023-09-09: 5 [IU] via SUBCUTANEOUS
  Administered 2023-09-09 (×2): 3 [IU] via SUBCUTANEOUS
  Administered 2023-09-09: 2 [IU] via SUBCUTANEOUS
  Administered 2023-09-09 (×2): 3 [IU] via SUBCUTANEOUS
  Administered 2023-09-10: 5 [IU] via SUBCUTANEOUS
  Administered 2023-09-10: 3 [IU] via SUBCUTANEOUS

## 2023-09-08 MED ORDER — MAGNESIUM SULFATE 4 GM/100ML IV SOLN
4.0000 g | Freq: Once | INTRAVENOUS | Status: AC
  Administered 2023-09-08: 4 g via INTRAVENOUS
  Filled 2023-09-08: qty 100

## 2023-09-08 MED ORDER — FAMOTIDINE 20 MG PO TABS: 20.0000 mg | ORAL_TABLET | Freq: Two times a day (BID) | ORAL | Status: DC

## 2023-09-08 MED ORDER — PANTOPRAZOLE SODIUM 40 MG IV SOLR
40.0000 mg | INTRAVENOUS | Status: DC
  Administered 2023-09-08 – 2023-09-10 (×3): 40 mg via INTRAVENOUS
  Filled 2023-09-08 (×3): qty 10

## 2023-09-08 MED ORDER — NYSTATIN 100000 UNIT/GM EX POWD
Freq: Two times a day (BID) | CUTANEOUS | Status: DC
  Administered 2023-09-08 – 2023-09-10 (×2): 1 via TOPICAL
  Filled 2023-09-08: qty 15

## 2023-09-08 MED ORDER — ACETAMINOPHEN 10 MG/ML IV SOLN
1000.0000 mg | Freq: Four times a day (QID) | INTRAVENOUS | Status: AC | PRN
  Administered 2023-09-08 – 2023-09-09 (×2): 1000 mg via INTRAVENOUS
  Filled 2023-09-08 (×2): qty 100

## 2023-09-08 NOTE — Inpatient Diabetes Management (Signed)
 Inpatient Diabetes Program Recommendations  AACE/ADA: New Consensus Statement on Inpatient Glycemic Control   Target Ranges:  Prepandial:   less than 140 mg/dL      Peak postprandial:   less than 180 mg/dL (1-2 hours)      Critically ill patients:  140 - 180 mg/dL    Latest Reference Range & Units 09/07/23 09:03 09/07/23 18:26 09/07/23 20:28 09/08/23 00:23 09/08/23 03:41 09/08/23 07:47  Glucose-Capillary 70 - 99 mg/dL 782 (H) 813 (H) 822 (H) 179 (H) 202 (H) 198 (H)   Review of Glycemic Control  Diabetes history: DM2 Outpatient Diabetes medications: Amaryl  2 mg QAM, Metformin  1000 mg BID, Mounjaro  12.5 mg Qweek Current orders for Inpatient glycemic control: Novolog  0-15 units Q4H  Inpatient Diabetes Program Recommendations:    Insulin : Please consider ordering Semglee  7 units Q24H.  Thanks, Earnie Gainer, RN, MSN, CDCES Diabetes Coordinator Inpatient Diabetes Program [phone] (Team Pager from 8am to 5pm)

## 2023-09-08 NOTE — Progress Notes (Signed)
 NAME:  Joan Walker, MRN:  [id number], DOB:  May 27, 1975, LOS: 1 ADMISSION DATE:  09/07/2023, CONSULTATION DATE:  09/08/23 REFERRING MD:  EDP, CHIEF COMPLAINT:  nausea and fatigue   History of Present Illness:  Joan Walker is a 48 y.o. F with PMH of DM type 2, obesity, renal stones requiring stenting, HL and hypothyroidism who presents with two days of generalized fatigue, nausea and chills.   She has been able to tolerate very little by mouth, but denies dysuria, fever, diarrhea or URI symptoms.  She follows with Dr. Brunetta and was just seen outpatient a few days ago.    In the ED she was hypotensive despite 3L IVF and required low dose levophed , labs significant for lactic acid of 8.5, WBC of 18k and creatinine of 2.2.  A CT abd/pelvis showed a 3x3x5 stone in the distal L ureter with mild hydronephrosis.  UA pending She was given broad spectrum abx and PCCM consulted in the setting of septic shock   Pertinent  Medical History   has a past medical history of Arthritis, Asthma, DM2 (diabetes mellitus, type 2) (HCC), GERD (gastroesophageal reflux disease), History of kidney stones, Hypercholesteremia, Hypothyroidism, and Morbid obesity (HCC).   Significant Hospital Events: Including procedures, antibiotic start and stop dates in addition to other pertinent events   8/18 admit with septic shock from nephrolithiasis   Interim History / Subjective:  Patient's blood culture 4 out of 4 bottles positive for E. coli She underwent cystoscopy and ureteral stent placed by urology last night Currently complaining of shortness of breath, denies chest pain or abdominal pain Spiked fever overnight but now she is afebrile  Objective    Blood pressure 92/65, pulse (!) 111, temperature 98.3 F (36.8 C), temperature source Oral, resp. rate 19, height 5' 7 (1.702 m), weight (!) 143.3 kg, last menstrual period 02/04/2023, SpO2 93%.        Intake/Output Summary (Last 24 hours) at 09/08/2023 0817 Last  data filed at 09/08/2023 9376 Gross per 24 hour  Intake 5420.51 ml  Output 2000 ml  Net 3420.51 ml   Filed Weights   09/07/23 0903  Weight: (!) 143.3 kg    Physical exam: General: Acutely ill-appearing middle-age morbidly obese female, lying on the bed HEENT: [place]/AT, eyes anicteric.  moist mucus membranes Neuro: She is restless, awake, following commands Chest: Tachypneic, coarse breath sounds, no wheezes or rhonchi Heart: Tachycardic, regular rhythm, no murmurs or gallops Abdomen: Soft, nontender, nondistended, bowel sounds present Extremities: 2+ nonpitting edema noted in bilateral lower extremities  Labs and images reviewed  Patient Lines/Drains/Airways Status     Active Line/Drains/Airways     Name Placement date Placement time Site Days   Arterial Line 09/07/23 Left Radial 09/07/23  1942  Radial  1   Peripheral IV 09/07/23 22 G 1 Anterior;Proximal;Right Forearm 09/07/23  0900  Forearm  1   Peripheral IV 09/07/23 20 G 1 Anterior;Left;Proximal Forearm 09/07/23  0920  Forearm  1   CVC Triple Lumen 09/07/23 Right Internal jugular 09/07/23  1644  -- 1   Urethral Catheter Dr. Sherrilee Non-latex 14 Fr. 09/07/23  2005  Non-latex  1   Ureteral Drain/Stent Left ureter 6 Fr. 01/16/22  1015  Left ureter  600   Ureteral Drain/Stent Left ureter 6 Fr. 09/07/23  2000  Left ureter  1   Wound 09/07/23 2004 Surgical Closed Surgical Incision Perineum 09/07/23  2004  Perineum  1         Resolved problem  list   Assessment and Plan  Severe sepsis with septic Shock due to complicated UTI in the setting of renal left ureteral stones  Left-sided hydronephrosis status post cystoscopy and left ureteral stent placement Acute left-sided pyelonephritis  E. coli bacteremia  Patient remains septic, she became afebrile Remained tachycardic and tachypneic White count is elevated Still requiring vasopressor support with Levophed , currently on 9 mics, titrate with MAP goal 65 IV antibiotics  switched to ceftriaxone  Continue Flomax  Urology is following  AKI due to combination of obstructive uropathy and septic ATN Hypomagnesemia Hyponatremia, corrected Serum creatinine is improving Monitor intake and output Avoid nephrotoxic agent Urinary obstruction was relieved with the ureteral stent, she has Foley catheter now Continue aggressive electrolyte replacement  Type 2 DM Old metformin  Continue sliding scale insulin  with CBG goal 140-180  HTN Hold antihypertensive in the setting of shock  Hypothyroidism  Continue synthroid   Morbid obesity Diet and exercise counseling as appropriate   Best Practice (right click and Reselect all SmartList Selections daily)   Diet/type: NPO w/ oral meds DVT prophylaxis prophylactic heparin   Pressure ulcer(s): Please see nursing notes GI prophylaxis: N/A Lines: N/A Foley:  N/A Code Status:  full code Last date of multidisciplinary goals of care discussion: 8/19, patient was updated at bedside, decision was to continue full scope of care  Labs   CBC: Recent Labs  Lab 09/07/23 0907 09/07/23 0937 09/07/23 2217 09/08/23 0521  WBC 18.4*  --  16.7* 13.5*  NEUTROABS 15.8*  --   --   --   HGB 11.3* 11.9* 10.2* 10.3*  HCT 35.5* 35.0* 31.8* 32.0*  MCV 88.3  --  87.8 87.4  PLT 244  --  189 188    Basic Metabolic Panel: Recent Labs  Lab 09/07/23 0907 09/07/23 0937 09/07/23 2217 09/08/23 0521  NA 132* 132*  --  135  K 4.1 4.0  --  3.5  CL 95*  --   --  100  CO2 18*  --   --  20*  GLUCOSE 267*  --   --  205*  BUN 21*  --   --  24*  CREATININE 2.23*  --  1.69* 1.57*  CALCIUM  8.4*  --   --  8.0*  MG  --   --   --  1.3*  PHOS  --   --   --  3.8   GFR: Estimated Creatinine Clearance: 65.2 mL/min (A) (by C-G formula based on SCr of 1.57 mg/dL (H)). Recent Labs  Lab 09/07/23 0907 09/07/23 0938 09/07/23 1122 09/07/23 1725 09/07/23 2217 09/08/23 0521  WBC 18.4*  --   --   --  16.7* 13.5*  LATICACIDVEN  --  8.0* 8.5*  6.6*  --   --     Liver Function Tests: Recent Labs  Lab 09/07/23 0907  AST 50*  ALT 37  ALKPHOS 46  BILITOT 1.2  PROT 6.2*  ALBUMIN  2.9*   Recent Labs  Lab 09/07/23 0907  LIPASE 18   No results for input(s): AMMONIA in the last 168 hours.  ABG    Component Value Date/Time   HCO3 20.1 09/07/2023 0937   TCO2 21 (L) 09/07/2023 0937   ACIDBASEDEF 6.0 (H) 09/07/2023 0937   O2SAT 99 09/07/2023 0937     Coagulation Profile: No results for input(s): INR, PROTIME in the last 168 hours.  Cardiac Enzymes: No results for input(s): CKTOTAL, CKMB, CKMBINDEX, TROPONINI in the last 168 hours.  HbA1C: Hemoglobin A1C  Date/Time Value Ref  Range Status  07/16/2023 08:46 AM 6.2 (A) 4.0 - 5.6 % Final   Hgb A1c MFr Bld  Date/Time Value Ref Range Status  04/16/2023 07:55 AM 7.5 (H) <5.7 % of total Hgb Final    Comment:    For someone without known diabetes, a hemoglobin A1c value of 6.5% or greater indicates that they may have  diabetes and this should be confirmed with a follow-up  test. . For someone with known diabetes, a value <7% indicates  that their diabetes is well controlled and a value  greater than or equal to 7% indicates suboptimal  control. A1c targets should be individualized based on  duration of diabetes, age, comorbid conditions, and  other considerations. . Currently, no consensus exists regarding use of hemoglobin A1c for diagnosis of diabetes for children. .   01/16/2023 08:46 AM 7.7 (H) <5.7 % of total Hgb Final    Comment:    For someone without known diabetes, a hemoglobin A1c value of 6.5% or greater indicates that they may have  diabetes and this should be confirmed with a follow-up  test. . For someone with known diabetes, a value <7% indicates  that their diabetes is well controlled and a value  greater than or equal to 7% indicates suboptimal  control. A1c targets should be individualized based on  duration of diabetes, age,  comorbid conditions, and  other considerations. . Currently, no consensus exists regarding use of hemoglobin A1c for diagnosis of diabetes for children. .     CBG: Recent Labs  Lab 09/07/23 1826 09/07/23 2028 09/08/23 0023 09/08/23 0341 09/08/23 0747  GLUCAP 186* 177* 179* 202* 198*    The patient is critically ill due to severe sepsis with septic shock due to UTI and bacteremia.  Critical care was necessary to treat or prevent imminent or life-threatening deterioration.  Critical care was time spent personally by me on the following activities: development of treatment plan with patient and/or surrogate as well as nursing, discussions with consultants, evaluation of patient's response to treatment, examination of patient, obtaining history from patient or surrogate, ordering and performing treatments and interventions, ordering and review of laboratory studies, ordering and review of radiographic studies, pulse oximetry, re-evaluation of patient's condition and participation in multidisciplinary rounds.   During this encounter critical care time was devoted to patient care services described in this note for 46 minutes.     Valinda Novas, MD [facility] See Amion for pager If no response to pager, please call [phone] until 7pm After 7pm, Please call E-link [phone]

## 2023-09-08 NOTE — Progress Notes (Signed)
 eLink Physician-Brief Progress Note Patient Name: Joan Walker DOB: Nov 29, 1975 MRN: [id number]   Date of Service  09/08/2023  HPI/Events of Note  Patient at extremely high risk of aspiration with PO intake.  eICU Interventions  PRN iv Tylenol  ordered for fever overnight.        Khadija Thier U Sasuke Yaffe 09/08/2023, 8:28 PM

## 2023-09-08 NOTE — Progress Notes (Incomplete)
 0840 Respiratory called to put BI-PAP on pt in [id number] Patient is ST

## 2023-09-08 NOTE — Progress Notes (Signed)
 Date and time results received: 09/08/23 1012 (use smartphrase .now to insert current time)  Test: Lactic Acid  Critical Value: 3.1  Name of Provider Notified: S Chand  Orders Received? Or Actions Taken?: Dr Notified no new orders

## 2023-09-08 NOTE — Progress Notes (Signed)
 PHARMACY - PHYSICIAN COMMUNICATION CRITICAL VALUE ALERT - BLOOD CULTURE IDENTIFICATION (BCID)  Joan Walker is an 48 y.o. female who presented to [facility] on 09/07/2023 admitted with septic shock secondary to UTI with acute pyelonephritis w/ obstructing L ureteral stone now s/p OR with LLJ ureteral stent  Assessment:  3/4 blood cultures with E. Coli (no R)  Name of physician (or Provider) Contacted: Dr. Mallory Breed  Current antibiotics: Cefepime [name] x1  Changes to prescribed antibiotics recommended:   -Start Ceftriaxone  2g IV every 24 hours   No results found for this or any previous visit.  Lynwood Poplar, PharmD, BCPS Clinical Pharmacist 09/08/2023 3:27 AM

## 2023-09-08 NOTE — Progress Notes (Signed)
 Patient pulling off bipap mask, placed on HHFNC. CCM notified.

## 2023-09-09 DIAGNOSIS — R6521 Severe sepsis with septic shock: Secondary | ICD-10-CM | POA: Diagnosis not present

## 2023-09-09 DIAGNOSIS — A419 Sepsis, unspecified organism: Secondary | ICD-10-CM | POA: Diagnosis not present

## 2023-09-09 DIAGNOSIS — N179 Acute kidney failure, unspecified: Secondary | ICD-10-CM | POA: Diagnosis not present

## 2023-09-09 DIAGNOSIS — N1330 Unspecified hydronephrosis: Secondary | ICD-10-CM

## 2023-09-09 LAB — BASIC METABOLIC PANEL WITH GFR
Anion gap: 11 (ref 5–15)
BUN: 24 mg/dL — ABNORMAL HIGH (ref 6–20)
CO2: 21 mmol/L — ABNORMAL LOW (ref 22–32)
Calcium: 7.8 mg/dL — ABNORMAL LOW (ref 8.9–10.3)
Chloride: 104 mmol/L (ref 98–111)
Creatinine, Ser: 1.49 mg/dL — ABNORMAL HIGH (ref 0.44–1.00)
GFR, Estimated: 43 mL/min — ABNORMAL LOW (ref >60)
Glucose, Bld: 222 mg/dL — ABNORMAL HIGH (ref 70–99)
Potassium: 3.5 mmol/L (ref 3.5–5.1)
Sodium: 136 mmol/L (ref 135–145)

## 2023-09-09 LAB — GLUCOSE, CAPILLARY
Glucose-Capillary: 144 mg/dL — ABNORMAL HIGH (ref 70–99)
Glucose-Capillary: 183 mg/dL — ABNORMAL HIGH (ref 70–99)
Glucose-Capillary: 187 mg/dL — ABNORMAL HIGH (ref 70–99)
Glucose-Capillary: 190 mg/dL — ABNORMAL HIGH (ref 70–99)
Glucose-Capillary: 191 mg/dL — ABNORMAL HIGH (ref 70–99)
Glucose-Capillary: 212 mg/dL — ABNORMAL HIGH (ref 70–99)

## 2023-09-09 LAB — CBC
HCT: 28.9 % — ABNORMAL LOW (ref 36.0–46.0)
Hemoglobin: 9.4 g/dL — ABNORMAL LOW (ref 12.0–15.0)
MCH: 28.3 pg (ref 26.0–34.0)
MCHC: 32.5 g/dL (ref 30.0–36.0)
MCV: 87 fL (ref 80.0–100.0)
Platelets: 142 K/uL — ABNORMAL LOW (ref 150–400)
RBC: 3.32 MIL/uL — ABNORMAL LOW (ref 3.87–5.11)
RDW: 15 % (ref 11.5–15.5)
WBC: 8.8 K/uL (ref 4.0–10.5)
nRBC: 0.3 % — ABNORMAL HIGH (ref 0.0–0.2)

## 2023-09-09 LAB — LACTIC ACID, PLASMA
Lactic Acid, Venous: 1.6 mmol/L (ref 0.5–1.9)
Lactic Acid, Venous: 1.4 mmol/L (ref 0.5–1.9)

## 2023-09-09 LAB — MAGNESIUM: Magnesium: 2.4 mg/dL (ref 1.7–2.4)

## 2023-09-09 MED ORDER — INSULIN GLARGINE 100 UNIT/ML ~~LOC~~ SOLN
5.0000 [IU] | Freq: Two times a day (BID) | SUBCUTANEOUS | Status: DC
  Administered 2023-09-09 (×2): 5 [IU] via SUBCUTANEOUS
  Filled 2023-09-09 (×5): qty 0.05

## 2023-09-09 MED ORDER — LACTATED RINGERS IV SOLN: INTRAVENOUS | Status: AC

## 2023-09-09 MED ORDER — LACTATED RINGERS IV SOLN: INTRAVENOUS | Status: DC

## 2023-09-09 MED ORDER — POTASSIUM CHLORIDE 10 MEQ/50ML IV SOLN
10.0000 meq | INTRAVENOUS | Status: AC
  Administered 2023-09-09 (×4): 10 meq via INTRAVENOUS
  Filled 2023-09-09 (×4): qty 50

## 2023-09-09 MED ORDER — LACTATED RINGERS IV BOLUS
500.0000 mL | Freq: Once | INTRAVENOUS | Status: AC
  Administered 2023-09-09: 500 mL via INTRAVENOUS

## 2023-09-09 MED ORDER — IPRATROPIUM-ALBUTEROL 0.5-2.5 (3) MG/3ML IN SOLN
3.0000 mL | RESPIRATORY_TRACT | Status: DC | PRN
  Administered 2023-09-09 – 2023-09-10 (×2): 3 mL via RESPIRATORY_TRACT
  Filled 2023-09-09 (×2): qty 3

## 2023-09-09 NOTE — Progress Notes (Signed)
 [facility] ADULT ICU REPLACEMENT PROTOCOL   The patient does apply for the [facility] Adult ICU Electrolyte Replacment Protocol based on the criteria listed below:   1.Exclusion criteria: TCTS, ECMO, Dialysis, and Myasthenia Gravis patients 2. Is GFR >/= 30 ml/min? Yes.    Patient's GFR today is 43 3. Is SCr </= 2? Yes.   Patient's SCr is 1.49 mg/dL 4. Did SCr increase >/= 0.5 in 24 hours? No. 5.Pt's weight >40kg  Yes.   6. Abnormal electrolyte(s):   K 3.5  7. Electrolytes replaced per protocol 8.  Call MD STAT for K+ </= 2.5, Phos </= 1, or Mag </= 1 Physician:  CLEMENTEEN Epimenio Blackbird R Emiyah Spraggins 09/09/2023 6:27 AM

## 2023-09-09 NOTE — Progress Notes (Signed)
 NAME:  Joan Walker, MRN:  [id number], DOB:  15-Jan-1976, LOS: 2 ADMISSION DATE:  09/07/2023, CONSULTATION DATE:  09/09/23 REFERRING MD:  EDP, CHIEF COMPLAINT:  nausea and fatigue   History of Present Illness:  Joan Walker is a 48 y.o. F with PMH of DM type 2, obesity, renal stones requiring stenting, HL and hypothyroidism who presents with two days of generalized fatigue, nausea and chills.   She has been able to tolerate very little by mouth, but denies dysuria, fever, diarrhea or URI symptoms.  She follows with Dr. Brunetta and was just seen outpatient a few days ago.    In the ED she was hypotensive despite 3L IVF and required low dose levophed , labs significant for lactic acid of 8.5, WBC of 18k and creatinine of 2.2.  A CT abd/pelvis showed a 3x3x5 stone in the distal L ureter with mild hydronephrosis.  UA pending She was given broad spectrum abx and PCCM consulted in the setting of septic shock   Pertinent  Medical History   has a past medical history of Arthritis, Asthma, DM2 (diabetes mellitus, type 2) (HCC), GERD (gastroesophageal reflux disease), History of kidney stones, Hypercholesteremia, Hypothyroidism, and Morbid obesity (HCC).   Significant Hospital Events: Including procedures, antibiotic start and stop dates in addition to other pertinent events   8/18 admit with septic shock from nephrolithiasis, underwent cystoscopy and left admitted to the stent 8/19 continue to require vasopressor support, agitated and restless started on Precedex .  Blood culture growing E. coli/4 bottles  Interim History / Subjective:  Patient spiked fever overnight with Tmax 102 She is on heated high flow nasal cannula oxygen, flow was increased to 20 L and FiO2 at 30% Still requiring vasopressor support, currently on 6 mics of Levophed   Denies abdominal pain or shortness of breath, stated slept well last night  Objective    Blood pressure (!) 88/69, pulse 76, temperature 98.9 F (37.2 C),  temperature source Axillary, resp. rate 19, height 5' 7 (1.702 m), weight (!) 142.6 kg, last menstrual period 02/04/2023, SpO2 97%.    Vent Mode: BIPAP;PCV FiO2 (%):  [36 %-45 %] 36 % Set Rate:  [15 bmp-20 bmp] 15 bmp PEEP:  [5 cmH20] 5 cmH20   Intake/Output Summary (Last 24 hours) at 09/09/2023 0806 Last data filed at 09/09/2023 0716 Gross per 24 hour  Intake 1607.41 ml  Output 850 ml  Net 757.41 ml   Filed Weights   09/07/23 0903 09/08/23 0704 09/09/23 0500  Weight: (!) 143.3 kg (!) 142.3 kg (!) 142.6 kg    Physical exam: General: Acute on chronically ill-appearing middle-aged morbidly obese female, lying on the bed HEENT: [place]/AT, eyes anicteric.  moist mucus membranes.  On heated high flow nasal cannula oxygen Neuro: Alert, awake following commands, moving all 4 extremities Chest: Reduced air entry at the bases bilaterally, no wheezes or rhonchi Heart: Regular rate and rhythm, no murmurs or gallops Abdomen: Soft, nontender, nondistended, bowel sounds present Skin: Noted to have fungal rash on abdomen, underneath breasts bilaterally  Labs and images reviewed  Patient Lines/Drains/Airways Status     Active Line/Drains/Airways     Name Placement date Placement time Site Days   Arterial Line 09/07/23 Left Radial 09/07/23  1942  Radial  2   Peripheral IV 09/07/23 22 G 1 Anterior;Proximal;Right Forearm 09/07/23  0900  Forearm  2   Peripheral IV 09/07/23 20 G 1 Anterior;Left;Proximal Forearm 09/07/23  0920  Forearm  2   CVC Triple Lumen 09/07/23 Right  Internal jugular 09/07/23  1644  -- 2   Urethral Catheter Dr. Sherrilee Non-latex 14 Fr. 09/07/23  2005  Non-latex  2   Ureteral Drain/Stent Left ureter 6 Fr. 01/16/22  1015  Left ureter  601   Ureteral Drain/Stent Left ureter 6 Fr. 09/07/23  2000  Left ureter  2   Wound 09/07/23 2004 Surgical Closed Surgical Incision Perineum 09/07/23  2004  Perineum  2         Resolved problem list   Assessment and Plan  Severe sepsis  with septic Shock due to complicated UTI in the setting of left ureteral stones  Left-sided hydronephrosis status post cystoscopy and left ureteral stent placement Acute left-sided pyelonephritis with E. coli E. coli bacteremia  Patient continued to spike fever with Tmax 102 She is not tachycardic anymore Still requiring vasopressor support, currently on 6 mics of Levophed  Will give her 500 cc of LR followed by 75 cc for 6 hours Continue IV ceftriaxone  to complete 2 weeks of therapy due to bacteremia White count is trending down Continue Flomax   AKI due to combination of obstructive uropathy and septic ATN Hypomagnesemia, resolved Hyponatremia, corrected Serum creatinine continue to improve Closely monitor urine output Electrolytes are being corrected Avoid nephrotoxic agent Continue Foley catheter for now  Acute respiratory failure with hypoxia due to bilateral atelectasis She is shallow breathing, currently on heated high flow nasal cannula oxygen Titrate with O2 sat goal 92% Currently on 20 L and 30% FiO2 She probably have obstructive sleep apnea, will need sleep studies as an outpatient  Type 2 DM, well-controlled Hide hemoglobin A1c 6.2 She is on metformin , glimepiride  and GLP-1 Blood sugars are elevated up to 200 Hold oral hypoglycemic agent Started on Lantus  5 units twice daily Continue sliding scale insulin  with CBG goal 140-180  HTN Hold antihypertensive in the setting of shock  Hypothyroidism  Continue synthroid   Morbid obesity Diet and exercise counseling as appropriate   Best Practice (right click and Reselect all SmartList Selections daily)   Diet/type: Bedside swallow evaluation DVT prophylaxis prophylactic heparin   Pressure ulcer(s): Please see nursing notes GI prophylaxis: N/A Lines: N/A Foley:  N/A Code Status:  full code Last date of multidisciplinary goals of care discussion: 8/19, patient was updated at bedside, decision was to continue full  scope of care  Labs   CBC: Recent Labs  Lab 09/07/23 0907 09/07/23 0937 09/07/23 2217 09/08/23 0521 09/09/23 0529  WBC 18.4*  --  16.7* 13.5* 8.8  NEUTROABS 15.8*  --   --   --   --   HGB 11.3* 11.9* 10.2* 10.3* 9.4*  HCT 35.5* 35.0* 31.8* 32.0* 28.9*  MCV 88.3  --  87.8 87.4 87.0  PLT 244  --  189 188 142*    Basic Metabolic Panel: Recent Labs  Lab 09/07/23 0907 09/07/23 0937 09/07/23 2217 09/08/23 0521 09/09/23 0529  NA 132* 132*  --  135 136  K 4.1 4.0  --  3.5 3.5  CL 95*  --   --  100 104  CO2 18*  --   --  20* 21*  GLUCOSE 267*  --   --  205* 222*  BUN 21*  --   --  24* 24*  CREATININE 2.23*  --  1.69* 1.57* 1.49*  CALCIUM  8.4*  --   --  8.0* 7.8*  MG  --   --   --  1.3* 2.4  PHOS  --   --   --  3.8  --  GFR: Estimated Creatinine Clearance: 68.5 mL/min (A) (by C-G formula based on SCr of 1.49 mg/dL (H)). Recent Labs  Lab 09/07/23 0907 09/07/23 0938 09/07/23 1122 09/07/23 1725 09/07/23 2217 09/08/23 0521 09/08/23 0850 09/08/23 1221 09/09/23 0529  WBC 18.4*  --   --   --  16.7* 13.5*  --   --  8.8  LATICACIDVEN  --    < > 8.5* 6.6*  --   --  3.1* 2.7*  --    < > = values in this interval not displayed.    Liver Function Tests: Recent Labs  Lab 09/07/23 0907  AST 50*  ALT 37  ALKPHOS 46  BILITOT 1.2  PROT 6.2*  ALBUMIN  2.9*   Recent Labs  Lab 09/07/23 0907  LIPASE 18   No results for input(s): AMMONIA in the last 168 hours.  ABG    Component Value Date/Time   HCO3 20.1 09/07/2023 0937   TCO2 21 (L) 09/07/2023 0937   ACIDBASEDEF 6.0 (H) 09/07/2023 0937   O2SAT 99 09/07/2023 0937     Coagulation Profile: No results for input(s): INR, PROTIME in the last 168 hours.  Cardiac Enzymes: No results for input(s): CKTOTAL, CKMB, CKMBINDEX, TROPONINI in the last 168 hours.  HbA1C: Hemoglobin A1C  Date/Time Value Ref Range Status  07/16/2023 08:46 AM 6.2 (A) 4.0 - 5.6 % Final   Hgb A1c MFr Bld  Date/Time Value Ref  Range Status  04/16/2023 07:55 AM 7.5 (H) <5.7 % of total Hgb Final    Comment:    For someone without known diabetes, a hemoglobin A1c value of 6.5% or greater indicates that they may have  diabetes and this should be confirmed with a follow-up  test. . For someone with known diabetes, a value <7% indicates  that their diabetes is well controlled and a value  greater than or equal to 7% indicates suboptimal  control. A1c targets should be individualized based on  duration of diabetes, age, comorbid conditions, and  other considerations. . Currently, no consensus exists regarding use of hemoglobin A1c for diagnosis of diabetes for children. .   01/16/2023 08:46 AM 7.7 (H) <5.7 % of total Hgb Final    Comment:    For someone without known diabetes, a hemoglobin A1c value of 6.5% or greater indicates that they may have  diabetes and this should be confirmed with a follow-up  test. . For someone with known diabetes, a value <7% indicates  that their diabetes is well controlled and a value  greater than or equal to 7% indicates suboptimal  control. A1c targets should be individualized based on  duration of diabetes, age, comorbid conditions, and  other considerations. . Currently, no consensus exists regarding use of hemoglobin A1c for diagnosis of diabetes for children. .     CBG: Recent Labs  Lab 09/08/23 1554 09/08/23 1947 09/08/23 2355 09/09/23 0358 09/09/23 0800  GLUCAP 271* 234* 185* 212* 190*    The patient is critically ill due to severe sepsis with septic shock due to UTI and bacteremia.  Critical care was necessary to treat or prevent imminent or life-threatening deterioration.  Critical care was time spent personally by me on the following activities: development of treatment plan with patient and/or surrogate as well as nursing, discussions with consultants, evaluation of patient's response to treatment, examination of patient, obtaining history from patient  or surrogate, ordering and performing treatments and interventions, ordering and review of laboratory studies, ordering and review of radiographic studies, pulse  oximetry, re-evaluation of patient's condition and participation in multidisciplinary rounds.   During this encounter critical care time was devoted to patient care services described in this note for 42 minutes.     Valinda Novas, MD [facility] See Amion for pager If no response to pager, please call [phone] until 7pm After 7pm, Please call E-link [phone]

## 2023-09-10 DIAGNOSIS — N132 Hydronephrosis with renal and ureteral calculous obstruction: Secondary | ICD-10-CM

## 2023-09-10 DIAGNOSIS — J9601 Acute respiratory failure with hypoxia: Secondary | ICD-10-CM

## 2023-09-10 DIAGNOSIS — A419 Sepsis, unspecified organism: Secondary | ICD-10-CM | POA: Diagnosis not present

## 2023-09-10 DIAGNOSIS — N179 Acute kidney failure, unspecified: Secondary | ICD-10-CM | POA: Diagnosis not present

## 2023-09-10 DIAGNOSIS — R6521 Severe sepsis with septic shock: Secondary | ICD-10-CM | POA: Diagnosis not present

## 2023-09-10 LAB — BASIC METABOLIC PANEL WITH GFR
Anion gap: 12 (ref 5–15)
BUN: 17 mg/dL (ref 6–20)
CO2: 24 mmol/L (ref 22–32)
Calcium: 7.8 mg/dL — ABNORMAL LOW (ref 8.9–10.3)
Chloride: 98 mmol/L (ref 98–111)
Creatinine, Ser: 1.2 mg/dL — ABNORMAL HIGH (ref 0.44–1.00)
GFR, Estimated: 56 mL/min — ABNORMAL LOW (ref >60)
Glucose, Bld: 212 mg/dL — ABNORMAL HIGH (ref 70–99)
Potassium: 3.5 mmol/L (ref 3.5–5.1)
Sodium: 134 mmol/L — ABNORMAL LOW (ref 135–145)

## 2023-09-10 LAB — CBC
HCT: 26 % — ABNORMAL LOW (ref 36.0–46.0)
Hemoglobin: 8.4 g/dL — ABNORMAL LOW (ref 12.0–15.0)
MCH: 28 pg (ref 26.0–34.0)
MCHC: 32.3 g/dL (ref 30.0–36.0)
MCV: 86.7 fL (ref 80.0–100.0)
Platelets: 145 K/uL — ABNORMAL LOW (ref 150–400)
RBC: 3 MIL/uL — ABNORMAL LOW (ref 3.87–5.11)
RDW: 15.2 % (ref 11.5–15.5)
WBC: 8 K/uL (ref 4.0–10.5)
nRBC: 0.4 % — ABNORMAL HIGH (ref 0.0–0.2)

## 2023-09-10 LAB — CULTURE, BLOOD (ROUTINE X 2)

## 2023-09-10 LAB — GLUCOSE, CAPILLARY
Glucose-Capillary: 176 mg/dL — ABNORMAL HIGH (ref 70–99)
Glucose-Capillary: 243 mg/dL — ABNORMAL HIGH (ref 70–99)
Glucose-Capillary: 194 mg/dL — ABNORMAL HIGH (ref 70–99)
Glucose-Capillary: 246 mg/dL — ABNORMAL HIGH (ref 70–99)
Glucose-Capillary: 256 mg/dL — ABNORMAL HIGH (ref 70–99)
Glucose-Capillary: 251 mg/dL — ABNORMAL HIGH (ref 70–99)

## 2023-09-10 LAB — MAGNESIUM: Magnesium: 2.2 mg/dL (ref 1.7–2.4)

## 2023-09-10 MED ORDER — INSULIN ASPART 100 UNIT/ML IJ SOLN
0.0000 [IU] | Freq: Every day | INTRAMUSCULAR | Status: DC
  Administered 2023-09-10: 3 [IU] via SUBCUTANEOUS

## 2023-09-10 MED ORDER — INSULIN GLARGINE 100 UNIT/ML ~~LOC~~ SOLN
8.0000 [IU] | Freq: Two times a day (BID) | SUBCUTANEOUS | Status: DC
  Administered 2023-09-10 – 2023-09-12 (×5): 8 [IU] via SUBCUTANEOUS
  Filled 2023-09-10 (×6): qty 0.08

## 2023-09-10 MED ORDER — POTASSIUM CHLORIDE 10 MEQ/50ML IV SOLN
10.0000 meq | INTRAVENOUS | Status: AC
  Administered 2023-09-10 (×3): 10 meq via INTRAVENOUS
  Filled 2023-09-10 (×3): qty 50

## 2023-09-10 MED ORDER — MIDODRINE HCL 5 MG PO TABS
10.0000 mg | ORAL_TABLET | Freq: Three times a day (TID) | ORAL | Status: DC
  Administered 2023-09-10 – 2023-09-11 (×6): 10 mg via ORAL
  Filled 2023-09-10 (×7): qty 2

## 2023-09-10 MED ORDER — INSULIN ASPART 100 UNIT/ML IJ SOLN: 0.0000 [IU] | Freq: Three times a day (TID) | INTRAMUSCULAR | Status: DC

## 2023-09-10 MED ORDER — INSULIN ASPART 100 UNIT/ML IJ SOLN
0.0000 [IU] | Freq: Three times a day (TID) | INTRAMUSCULAR | Status: DC
  Administered 2023-09-10: 3 [IU] via SUBCUTANEOUS
  Administered 2023-09-10: 5 [IU] via SUBCUTANEOUS
  Administered 2023-09-11: 3 [IU] via SUBCUTANEOUS
  Administered 2023-09-11 (×2): 5 [IU] via SUBCUTANEOUS
  Administered 2023-09-12: 3 [IU] via SUBCUTANEOUS

## 2023-09-10 MED ORDER — LACTATED RINGERS IV SOLN: INTRAVENOUS | Status: AC

## 2023-09-10 MED ORDER — ALBUMIN HUMAN 25 % IV SOLN
25.0000 g | Freq: Four times a day (QID) | INTRAVENOUS | Status: AC
  Administered 2023-09-10 – 2023-09-11 (×4): 25 g via INTRAVENOUS
  Filled 2023-09-10 (×4): qty 100

## 2023-09-10 MED ORDER — LACTATED RINGERS IV BOLUS
500.0000 mL | Freq: Once | INTRAVENOUS | Status: AC
  Administered 2023-09-10: 500 mL via INTRAVENOUS

## 2023-09-10 NOTE — Progress Notes (Signed)
 NAME:  Joan Walker, MRN:  [id number], DOB:  1975-11-19, LOS: 3 ADMISSION DATE:  09/07/2023, CONSULTATION DATE:  09/10/23 REFERRING MD:  EDP, CHIEF COMPLAINT:  nausea and fatigue   History of Present Illness:  Joan Walker is a 48 y.o. F with PMH of DM type 2, obesity, renal stones requiring stenting, HL and hypothyroidism who presents with two days of generalized fatigue, nausea and chills.   She has been able to tolerate very little by mouth, but denies dysuria, fever, diarrhea or URI symptoms.  She follows with Dr. Brunetta and was just seen outpatient a few days ago.    In the ED she was hypotensive despite 3L IVF and required low dose levophed , labs significant for lactic acid of 8.5, WBC of 18k and creatinine of 2.2.  A CT abd/pelvis showed a 3x3x5 stone in the distal L ureter with mild hydronephrosis.  UA pending She was given broad spectrum abx and PCCM consulted in the setting of septic shock   Pertinent  Medical History   has a past medical history of Arthritis, Asthma, DM2 (diabetes mellitus, type 2) (HCC), GERD (gastroesophageal reflux disease), History of kidney stones, Hypercholesteremia, Hypothyroidism, and Morbid obesity (HCC).   Significant Hospital Events: Including procedures, antibiotic start and stop dates in addition to other pertinent events   8/18 admit with septic shock from nephrolithiasis, underwent cystoscopy and left admitted to the stent 8/19 continue to require vasopressor support, agitated and restless started on Precedex .  Blood culture growing E. coli/4 bottles 8/20 spiked fever with Tmax 102, continue to require vasopressor support with Levophed , heated high flow nasal cannula titrated down to regular nasal cannula  Interim History / Subjective:  Patient became afebrile Came off of vasopressor support Stated feeling better  Objective    Blood pressure 102/66, pulse 88, temperature 98.2 F (36.8 C), temperature source Oral, resp. rate (!) 21, height 5'  7 (1.702 m), weight (!) 144 kg, last menstrual period 02/04/2023, SpO2 94%.    FiO2 (%):  [30 %] 30 %   Intake/Output Summary (Last 24 hours) at 09/10/2023 0859 Last data filed at 09/10/2023 0800 Gross per 24 hour  Intake 620.33 ml  Output 2350 ml  Net -1729.67 ml   Filed Weights   09/08/23 0704 09/09/23 0500 09/10/23 0500  Weight: (!) 142.3 kg (!) 142.6 kg (!) 144 kg    Physical exam: General: Acutely ill-appearing morbidly obese middle-aged female, lying on the bed HEENT: [place]/AT, eyes anicteric.  moist mucus membranes Neuro: Alert, awake following commands Chest: Coarse breath sounds, no wheezes or rhonchi Heart: Regular rate and rhythm, no murmurs or gallops Abdomen: Soft, nontender, nondistended, bowel sounds present  Labs and images reviewed  Patient Lines/Drains/Airways Status     Active Line/Drains/Airways     Name Placement date Placement time Site Days   Peripheral IV 09/07/23 22 G 1 Anterior;Proximal;Right Forearm 09/07/23  0900  Forearm  3   Peripheral IV 09/07/23 20 G 1 Anterior;Left;Proximal Forearm 09/07/23  0920  Forearm  3   CVC Triple Lumen 09/07/23 Right Internal jugular 09/07/23  1644  -- 3   Urethral Catheter Dr. Sherrilee Non-latex 14 Fr. 09/07/23  2005  Non-latex  3   Ureteral Drain/Stent Left ureter 6 Fr. 01/16/22  1015  Left ureter  602   Ureteral Drain/Stent Left ureter 6 Fr. 09/07/23  2000  Left ureter  3   Wound 09/07/23 2004 Surgical Closed Surgical Incision Perineum 09/07/23  2004  Perineum  3  Resolved problem list   Assessment and Plan  Severe sepsis with septic Shock due to complicated UTI in the setting of left ureteral stones  Left-sided hydronephrosis status post cystoscopy and left ureteral stent placement Acute left-sided pyelonephritis with E. coli E. coli bacteremia  Patient became afebrile Sepsis has resolved Came off of vasopressor support Continue IV antibiotics with ceftriaxone  to complete 14 days therapy due to  bacteremia Patient will follow-up with urology as an outpatient Continue Flomax   AKI due to combination of obstructive uropathy and septic ATN Hypomagnesemia, resolved Hyponatremia Serum creatinine continued to improve, trended down to 1.2 from 1.6 on admission She has good urine output Closely monitor urine output Electrolytes are being corrected Avoid nephrotoxic agent Discontinue Foley catheter  Acute respiratory failure with hypoxia due to bilateral atelectasis Oxygen was titrated down, currently on 2 L nasal cannula Titrate with O2 sat goal 92% She probably have obstructive sleep apnea, will need sleep studies as an outpatient  Type 2 DM, well-controlled Hemoglobin A1c 6.2 She is on metformin , glimepiride  and GLP-1 Blood sugars are elevated up to 200 Hold oral hypoglycemic agent Increase Lantus  to 8 units twice daily Continue sliding scale insulin  with CBG goal 140-180  HTN Hold antihypertensive in the setting of shock Currently on midodrine  10 mg 3 times daily  Hypothyroidism  Continue synthroid   Morbid obesity Diet and exercise counseling as appropriate   Best Practice (right click and Reselect all SmartList Selections daily)   Diet/type: Regular consistency DVT prophylaxis prophylactic heparin   Pressure ulcer(s): Please see nursing notes GI prophylaxis: N/A Lines: N/A Foley:  N/A Code Status:  full code Last date of multidisciplinary goals of care discussion: 8/19, patient was updated at bedside, decision was to continue full scope of care  Labs   CBC: Recent Labs  Lab 09/07/23 0907 09/07/23 0937 09/07/23 2217 09/08/23 0521 09/09/23 0529 09/10/23 0553  WBC 18.4*  --  16.7* 13.5* 8.8 8.0  NEUTROABS 15.8*  --   --   --   --   --   HGB 11.3* 11.9* 10.2* 10.3* 9.4* 8.4*  HCT 35.5* 35.0* 31.8* 32.0* 28.9* 26.0*  MCV 88.3  --  87.8 87.4 87.0 86.7  PLT 244  --  189 188 142* 145*    Basic Metabolic Panel: Recent Labs  Lab 09/07/23 0907  09/07/23 0937 09/07/23 2217 09/08/23 0521 09/09/23 0529 09/10/23 0553  NA 132* 132*  --  135 136 134*  K 4.1 4.0  --  3.5 3.5 3.5  CL 95*  --   --  100 104 98  CO2 18*  --   --  20* 21* 24  GLUCOSE 267*  --   --  205* 222* 212*  BUN 21*  --   --  24* 24* 17  CREATININE 2.23*  --  1.69* 1.57* 1.49* 1.20*  CALCIUM  8.4*  --   --  8.0* 7.8* 7.8*  MG  --   --   --  1.3* 2.4 2.2  PHOS  --   --   --  3.8  --   --    GFR: Estimated Creatinine Clearance: 85.6 mL/min (A) (by C-G formula based on SCr of 1.2 mg/dL (H)). Recent Labs  Lab 09/07/23 2217 09/08/23 0521 09/08/23 0850 09/08/23 1221 09/09/23 0529 09/09/23 0921 09/09/23 1304 09/10/23 0553  WBC 16.7* 13.5*  --   --  8.8  --   --  8.0  LATICACIDVEN  --   --  3.1* 2.7*  --  1.6 1.4  --     Liver Function Tests: Recent Labs  Lab 09/07/23 0907  AST 50*  ALT 37  ALKPHOS 46  BILITOT 1.2  PROT 6.2*  ALBUMIN  2.9*   Recent Labs  Lab 09/07/23 0907  LIPASE 18   No results for input(s): AMMONIA in the last 168 hours.  ABG    Component Value Date/Time   HCO3 20.1 09/07/2023 0937   TCO2 21 (L) 09/07/2023 0937   ACIDBASEDEF 6.0 (H) 09/07/2023 0937   O2SAT 99 09/07/2023 0937     Coagulation Profile: No results for input(s): INR, PROTIME in the last 168 hours.  Cardiac Enzymes: No results for input(s): CKTOTAL, CKMB, CKMBINDEX, TROPONINI in the last 168 hours.  HbA1C: Hemoglobin A1C  Date/Time Value Ref Range Status  07/16/2023 08:46 AM 6.2 (A) 4.0 - 5.6 % Final   Hgb A1c MFr Bld  Date/Time Value Ref Range Status  04/16/2023 07:55 AM 7.5 (H) <5.7 % of total Hgb Final    Comment:    For someone without known diabetes, a hemoglobin A1c value of 6.5% or greater indicates that they may have  diabetes and this should be confirmed with a follow-up  test. . For someone with known diabetes, a value <7% indicates  that their diabetes is well controlled and a value  greater than or equal to 7% indicates  suboptimal  control. A1c targets should be individualized based on  duration of diabetes, age, comorbid conditions, and  other considerations. . Currently, no consensus exists regarding use of hemoglobin A1c for diagnosis of diabetes for children. .   01/16/2023 08:46 AM 7.7 (H) <5.7 % of total Hgb Final    Comment:    For someone without known diabetes, a hemoglobin A1c value of 6.5% or greater indicates that they may have  diabetes and this should be confirmed with a follow-up  test. . For someone with known diabetes, a value <7% indicates  that their diabetes is well controlled and a value  greater than or equal to 7% indicates suboptimal  control. A1c targets should be individualized based on  duration of diabetes, age, comorbid conditions, and  other considerations. . Currently, no consensus exists regarding use of hemoglobin A1c for diagnosis of diabetes for children. .     CBG: Recent Labs  Lab 09/09/23 1542 09/09/23 1940 09/09/23 2340 09/10/23 0337 09/10/23 0726  GLUCAP 183* 187* 144* 176* 243*      Valinda Novas, MD [facility] See Amion for pager If no response to pager, please call [phone] until 7pm After 7pm, Please call E-link [phone]

## 2023-09-10 NOTE — TOC CM/SW Note (Signed)
 Transition of Care [facility]) - Inpatient Brief Assessment   Patient Details  Name: Joan Walker MRN: [id number] Date of Birth: September 09, 1975  [facility] CM/SW Contact:    Inocente GORMAN Kindle, LCSW Phone Number: 09/10/2023, 5:05 PM   Clinical Narrative: Patient admitted from place of work, typically resides at home with spouse undergoing workup for Sepsis. No current TOC needs identified at this time but please place consult if needs arise.    Transition of Care Asessment: Insurance and Status: Insurance coverage has been reviewed Patient has primary care physician: Yes Home environment has been reviewed: From home Prior level of function:: Independent Prior/Current Home Services: No current home services Social Drivers of Health Review: SDOH reviewed no interventions necessary Readmission risk has been reviewed: Yes Transition of care needs: no transition of care needs at this time

## 2023-09-11 DIAGNOSIS — N170 Acute kidney failure with tubular necrosis: Secondary | ICD-10-CM

## 2023-09-11 DIAGNOSIS — R6521 Severe sepsis with septic shock: Secondary | ICD-10-CM | POA: Diagnosis not present

## 2023-09-11 DIAGNOSIS — A419 Sepsis, unspecified organism: Secondary | ICD-10-CM | POA: Diagnosis not present

## 2023-09-11 LAB — CBC
HCT: 28.4 % — ABNORMAL LOW (ref 36.0–46.0)
Hemoglobin: 9.2 g/dL — ABNORMAL LOW (ref 12.0–15.0)
MCH: 27.6 pg (ref 26.0–34.0)
MCHC: 32.4 g/dL (ref 30.0–36.0)
MCV: 85.3 fL (ref 80.0–100.0)
Platelets: 196 K/uL (ref 150–400)
RBC: 3.33 MIL/uL — ABNORMAL LOW (ref 3.87–5.11)
RDW: 15.7 % — ABNORMAL HIGH (ref 11.5–15.5)
WBC: 10.4 K/uL (ref 4.0–10.5)
nRBC: 0 % (ref 0.0–0.2)

## 2023-09-11 LAB — BASIC METABOLIC PANEL WITH GFR
Anion gap: 9 (ref 5–15)
BUN: 11 mg/dL (ref 6–20)
CO2: 25 mmol/L (ref 22–32)
Calcium: 8.1 mg/dL — ABNORMAL LOW (ref 8.9–10.3)
Chloride: 103 mmol/L (ref 98–111)
Creatinine, Ser: 1.06 mg/dL — ABNORMAL HIGH (ref 0.44–1.00)
GFR, Estimated: >60 mL/min (ref >60)
Glucose, Bld: 166 mg/dL — ABNORMAL HIGH (ref 70–99)
Potassium: 3.6 mmol/L (ref 3.5–5.1)
Sodium: 137 mmol/L (ref 135–145)

## 2023-09-11 LAB — GLUCOSE, CAPILLARY
Glucose-Capillary: 188 mg/dL — ABNORMAL HIGH (ref 70–99)
Glucose-Capillary: 235 mg/dL — ABNORMAL HIGH (ref 70–99)
Glucose-Capillary: 246 mg/dL — ABNORMAL HIGH (ref 70–99)
Glucose-Capillary: 186 mg/dL — ABNORMAL HIGH (ref 70–99)

## 2023-09-11 MED ORDER — CEFADROXIL 500 MG PO CAPS
1000.0000 mg | ORAL_CAPSULE | Freq: Two times a day (BID) | ORAL | Status: DC
  Administered 2023-09-12: 1000 mg via ORAL
  Filled 2023-09-11: qty 2

## 2023-09-11 NOTE — Progress Notes (Signed)
  Progress Note   Patient: Joan Walker FMW:[id number] DOB: 1975/12/29 DOA: 09/07/2023     4 DOS: the patient was seen and examined on 09/11/2023        Brief hospital course: 48 y.o. F with hypothyroidism, DM, HLD, MO presented with confusion chills, found to have septic shock from impacted ureteral stone.    Admitted to ICU on pressors, underwent cystoscopy by Dr. Sherrilee on 8/18 with placement of left ureteral stent.       Assessment and Plan: Severe sepsis with septic shock due to E. coli bacteremia due to complicated UTI Left renal stone with hydronephrosis status post ureteral stent placement Left-sided pyelonephritis Presented with tachycardia, hypotension, renal failure, leukocytosis, requiring Levophed .  Weaned off of Levophed , currently on midodrine , blood pressure still soft  Left stent placed 8/18, has Urology follow up scheduled with her Urologist Dr. Watt next week - Continue Rocephin  - Continue midodrine  - Hold oxybutynin , prednisone   Acute renal failure Acute tubular necrosis Creatinine 2.2 on admission, improved with fluids to 1.0 today. This renal failure is from sepsis primarily.  Acute respiratory failure with hypoxia due to sepsis Resolved  Class III obesity BMI 47, complicates care - Hold Mounjaro   Type 2 diabetes Glucose elevated due to dietary choices. - Continue SS corrections - Hold glimepiride , metformin , Mounjaro   Hypertension Hyperlipidemia BP soft - Continue midodrine  - Hold Lipitor - Hold indapamide and lisinopril   Hypothyroidism TSH normal - Continue LT4  Hypomagnesemia Hyponatremia Resolved       Subjective: Patient has no complaints.  She is working on her computer.  Pressure still soft.  Transferred out of the unit yesterday.     Physical Exam: BP 113/66 (BP Location: Right Arm)   Pulse 81   Temp (!) 97.4 F (36.3 C) (Oral)   Resp (!) 25   Ht 5' 7 (1.702 m)   Wt (!) 138.7 kg   LMP 02/04/2023  (Approximate)   SpO2 95%   BMI 47.89 kg/m   Obese adult female, lying in bed, interactive and appropriate RRR, no murmurs, no peripheral Respiratory normal, lungs clear without rales or wheezes Abdomen soft no tenderness palpation or guarding, no ascites or distention   Data Reviewed: Basic metabolic panel shows resolved acute renal failure, normal electrolytes CBC shows mild anemia  Family Communication: None present    Disposition: Status is: Inpatient         Author: Lonni SHAUNNA Dalton, MD 09/11/2023 4:34 PM  For on call review [email or web address].

## 2023-09-11 NOTE — Hospital Course (Signed)
 48 y.o. F with hypothyroidism, DM, HLD, MO presented with confusion chills, found to have septic shock from impacted ureteral stone.    Admitted to ICU on pressors, underwent cystoscopy by Dr. Sherrilee on 8/18 with placement of left ureteral stent.

## 2023-09-11 NOTE — Evaluation (Signed)
 Physical Therapy Evaluation Patient Details Name: Joan Walker MRN: [id number] DOB: Feb 20, 1975 Today's Date: 09/11/2023  History of Present Illness  48 yo female presents to [facility] ED on 09/07/23 with c/o generalized weakness, exertional dyspnea, and abdominal pain. CT showed L mid ureteral calculus. Admit for septic shock from nephrolithiasis. S/p cystoscopy 8/18. PMH: DM2, GERD, HLD, obesity, hypothyroid  Clinical Impression  Pt presents with generalized weakness upon admit. Pt states she feels close if not at baseline. Pt reports mild pain in L wrist due to bruising and pain during voiding but thinks it will resolve with time. Pt ambulated short community distances with no significant LOB. Pt to not benefit from further PT. PT to sign off for now.         If plan is discharge home, recommend the following:     Can travel by private vehicle        Equipment Recommendations None recommended by PT  Recommendations for Other Services       Functional Status Assessment Patient has not had a recent decline in their functional status     Precautions / Restrictions Precautions Precautions: None Restrictions Weight Bearing Restrictions Per Provider Order: No      Mobility  Bed Mobility               General bed mobility comments: In chair at start and end of session    Transfers Overall transfer level: Needs assistance Equipment used: None Transfers: Sit to/from Stand Sit to Stand: Supervision           General transfer comment: Sit to stand x3. For safety    Ambulation/Gait Ambulation/Gait assistance: Supervision (For safety) Gait Distance (Feet): 300 Feet   Gait Pattern/deviations: Step-through pattern, Decreased stride length       General Gait Details: No significant LOB with modified DGI (head turns, gait speed)  Stairs            Wheelchair Mobility     Tilt Bed    Modified Rankin (Stroke Patients Only)       Balance Overall balance  assessment: Independent                                           Pertinent Vitals/Pain Pain Assessment Pain Assessment: Faces Faces Pain Scale: Hurts a little bit Pain Location: Pain during voiding and L wrist Pain Intervention(s): Monitored during session    Home Living Family/patient expects to be discharged to:: Private residence Living Arrangements: Spouse/significant other;Children Available Help at Discharge: Family Type of Home: House Home Access: Stairs to enter   Secretary/administrator of Steps: 2   Home Layout: One level Home Equipment: None      Prior Function Prior Level of Function : Independent/Modified Independent;Working/employed;Driving                     Extremity/Trunk Assessment   Upper Extremity Assessment Upper Extremity Assessment: Overall WFL for tasks assessed    Lower Extremity Assessment Lower Extremity Assessment: Overall WFL for tasks assessed    Cervical / Trunk Assessment Cervical / Trunk Assessment: Normal  Communication   Communication Communication: No apparent difficulties    Cognition Arousal: Alert Behavior During Therapy: WFL for tasks assessed/performed   PT - Cognitive impairments: No apparent impairments  Following commands: Intact       Cueing Cueing Techniques: Verbal cues     General Comments General comments (skin integrity, edema, etc.): VSS on RA    Exercises     Assessment/Plan    PT Assessment Patient does not need any further PT services  PT Problem List         PT Treatment Interventions      PT Goals (Current goals can be found in the Care Plan section)  Acute Rehab PT Goals Patient Stated Goal: Return home PT Goal Formulation: With patient Time For Goal Achievement: 09/25/23 Potential to Achieve Goals: Good    Frequency       Co-evaluation               AM-PAC PT 6 Clicks Mobility  Outcome Measure Help needed  turning from your back to your side while in a flat bed without using bedrails?: None Help needed moving from lying on your back to sitting on the side of a flat bed without using bedrails?: None Help needed moving to and from a bed to a chair (including a wheelchair)?: None Help needed standing up from a chair using your arms (e.g., wheelchair or bedside chair)?: None Help needed to walk in hospital room?: None Help needed climbing 3-5 steps with a railing? : None 6 Click Score: 24    End of Session   Activity Tolerance: Patient tolerated treatment well Patient left: in chair;with call bell/phone within reach Nurse Communication: Mobility status PT Visit Diagnosis: Muscle weakness (generalized) (M62.81)    Time: 8956-8944 PT Time Calculation (min) (ACUTE ONLY): 12 min   Charges:   PT Evaluation $PT Eval Low Complexity: 1 Low   PT General Charges $$ ACUTE PT VISIT: 1 Visit         Quintin Campi, SPT  Acute Rehab  [phone]   Quintin Campi 09/11/2023, 1:51 PM

## 2023-09-12 ENCOUNTER — Other Ambulatory Visit (HOSPITAL_COMMUNITY): Payer: Self-pay

## 2023-09-12 DIAGNOSIS — A419 Sepsis, unspecified organism: Secondary | ICD-10-CM | POA: Diagnosis not present

## 2023-09-12 DIAGNOSIS — N170 Acute kidney failure with tubular necrosis: Secondary | ICD-10-CM | POA: Diagnosis not present

## 2023-09-12 DIAGNOSIS — R6521 Severe sepsis with septic shock: Secondary | ICD-10-CM | POA: Diagnosis not present

## 2023-09-12 LAB — GLUCOSE, CAPILLARY: Glucose-Capillary: 172 mg/dL — ABNORMAL HIGH (ref 70–99)

## 2023-09-12 MED ORDER — CEFADROXIL 500 MG PO CAPS
1000.0000 mg | ORAL_CAPSULE | Freq: Two times a day (BID) | ORAL | 0 refills | Status: AC
  Filled 2023-09-12: qty 14, 4d supply, fill #0

## 2023-09-12 NOTE — Plan of Care (Signed)
 Pt having trouble going to sleep.  No meds taken at home for sleep.   Problem: Education: Goal: Ability to describe self-care measures that may prevent or decrease complications (Diabetes Survival Skills Education) will improve Outcome: Progressing Goal: Individualized Educational Video(s) Outcome: Progressing   Problem: Coping: Goal: Ability to adjust to condition or change in health will improve Outcome: Progressing   Problem: Fluid Volume: Goal: Ability to maintain a balanced intake and output will improve Outcome: Progressing   Problem: Health Behavior/Discharge Planning: Goal: Ability to identify and utilize available resources and services will improve Outcome: Progressing Goal: Ability to manage health-related needs will improve Outcome: Progressing   Problem: Metabolic: Goal: Ability to maintain appropriate glucose levels will improve Outcome: Progressing   Problem: Nutritional: Goal: Maintenance of adequate nutrition will improve Outcome: Progressing Goal: Progress toward achieving an optimal weight will improve Outcome: Progressing   Problem: Skin Integrity: Goal: Risk for impaired skin integrity will decrease Outcome: Progressing   Problem: Tissue Perfusion: Goal: Adequacy of tissue perfusion will improve Outcome: Progressing   Problem: Education: Goal: Knowledge of General Education information will improve Description: Including pain rating scale, medication(s)/side effects and non-pharmacologic comfort measures Outcome: Progressing   Problem: Health Behavior/Discharge Planning: Goal: Ability to manage health-related needs will improve Outcome: Progressing   Problem: Clinical Measurements: Goal: Ability to maintain clinical measurements within normal limits will improve Outcome: Progressing Goal: Will remain free from infection Outcome: Progressing Goal: Diagnostic test results will improve Outcome: Progressing Goal: Respiratory complications will  improve Outcome: Progressing Goal: Cardiovascular complication will be avoided Outcome: Progressing   Problem: Activity: Goal: Risk for activity intolerance will decrease Outcome: Progressing   Problem: Nutrition: Goal: Adequate nutrition will be maintained Outcome: Progressing   Problem: Coping: Goal: Level of anxiety will decrease Outcome: Progressing   Problem: Elimination: Goal: Will not experience complications related to bowel motility Outcome: Progressing Goal: Will not experience complications related to urinary retention Outcome: Progressing   Problem: Pain Managment: Goal: General experience of comfort will improve and/or be controlled Outcome: Progressing   Problem: Safety: Goal: Ability to remain free from injury will improve Outcome: Progressing   Problem: Skin Integrity: Goal: Risk for impaired skin integrity will decrease Outcome: Progressing   Problem: Fluid Volume: Goal: Hemodynamic stability will improve Outcome: Progressing   Problem: Clinical Measurements: Goal: Diagnostic test results will improve Outcome: Progressing Goal: Signs and symptoms of infection will decrease Outcome: Progressing   Problem: Respiratory: Goal: Ability to maintain adequate ventilation will improve Outcome: Progressing

## 2023-09-12 NOTE — Discharge Summary (Signed)
 Physician Discharge Summary   Patient: Joan Walker MRN: [id number] DOB: 04-20-1975  Admit date:     09/07/2023  Discharge date: 09/12/23  Discharge Physician: Lonni SHAUNNA Dalton   PCP: Leonel Cole, MD     Recommendations at discharge:  Follow up with Urology Dr. Watt in 1 week for ureterolith, stent and UTI sepsis Follow up with Cole Leonel PCP for sepsis Dr. Leonel: Please resume lisinopril  and indapamide when appropriate Please see CT abdomen report re: left breast density, and if not previously evaluated, refer for mammogram Please obtain BMP and CBC in 1 week (discharge creatinine 1.06, hemoglobin 9.2)     Discharge Diagnoses: Principal Problem:   Severe sepsis with septic shock from E. coli bacteremia from UTI from Impacted stone Active Problems:   Ureteral calculus   E. coli bacteremia   Left renal stone with hydronephrosis   Left-sided pyelonephritis   Acute renal failure     Diabetes   Hypertension   Class III obesity   Hypothyroidism     Hospital Course: 48 y.o. F with hypothyroidism, DM, HLD, MO presented with confusion chills, found to have septic shock from impacted ureteral stone.    Admitted to ICU on pressors, underwent cystoscopy by Dr. Sherrilee on 8/18 with placement of left ureteral stent.      Severe sepsis with septic shock due to E. coli bacteremia due to complicated UTI Left renal stone with hydronephrosis status post ureteral stent placement Left-sided pyelonephritis Presented with tachycardia, hypotension, renal failure, leukocytosis, requiring Levophed .   Weaned off of Levophed  to midodrine .  Recovered well, midodrine  tapered off, discharged without indapamide or lisinopril .  The patient had a stent placed 8/18 in the left ureter, and has follow up already arranged with her urologist Dr. Bernardino next week.  Urine culture with pansensitive E. coli, she was discharged on cefadroxil  twice daily for 7 more days given retained stone/stent.   Defer prolongation of treatment to Dr. Wrenn.     Acute renal failure Acute tubular necrosis Creatinine 2.2 on admission, improved with fluids to 1.0    Acute respiratory failure with hypoxia due to sepsis Resolved     Hypertension Hyperlipidemia BP soft.  Indapamide and lisinopril  held at discharge, recommend resumption with PCP when appropriate.      Hypothyroidism TSH normal, stable on levothyroxine    Hypomagnesemia Hyponatremia Resolved           The [place]  Controlled Substances Registry was reviewed for this patient prior to discharge.  Consultants: Critical care, urology Procedures performed: Left ureteral stent placement Disposition: Home Diet recommendation:  Cardiac and Carb modified diet  DISCHARGE MEDICATION: Allergies as of 09/12/2023       Reactions   Bee Pollen Swelling        Medication List     PAUSE taking these medications    indapamide 1.25 MG tablet Wait to take this until your doctor or other care provider tells you to start again. Commonly known as: LOZOL Take 1.25 mg by mouth daily.   lisinopril  20 MG tablet Wait to take this until your doctor or other care provider tells you to start again. Commonly known as: ZESTRIL  Take 20 mg by mouth every evening.   oxybutynin  5 MG tablet Wait to take this until: September 21, 2023 Commonly known as: DITROPAN  Take 1 tablet (5 mg total) by mouth every 8 (eight) hours as needed for bladder spasms.       STOP taking these medications    predniSONE   20 MG tablet Commonly known as: DELTASONE        TAKE these medications    acetaminophen  500 MG tablet Commonly known as: TYLENOL  Take 1,000 mg by mouth every 6 (six) hours as needed for fever or moderate pain.   albuterol  108 (90 Base) MCG/ACT inhaler Commonly known as: VENTOLIN  HFA Inhale 2 puffs into the lungs as needed for wheezing or shortness of breath.   atorvastatin  20 MG tablet Commonly known as: LIPITOR Take 1  tablet (20 mg total) by mouth daily.   CARBOXYMETHYLCELLUL-GLYCERIN OP Place 1 drop into both eyes as needed (dry eyes).   cefadroxil  500 MG capsule Commonly known as: DURICEF Take 2 capsules (1,000 mg total) by mouth 2 (two) times daily.   famotidine  20 MG tablet Commonly known as: PEPCID  Take 20 mg by mouth 2 (two) times daily as needed for heartburn or indigestion.   fluticasone 50 MCG/ACT nasal spray Commonly known as: FLONASE Place 1 spray into both nostrils as needed for allergies.   glimepiride  2 MG tablet Commonly known as: AMARYL  Take 1 tablet (2 mg total) by mouth daily before supper.   levothyroxine  200 MCG tablet Commonly known as: SYNTHROID  Take 1 tablet (200 mcg total) by mouth daily before breakfast. 1 TABLET BEFORE BREAKFAST DAILY EXCEPT HALF TABLET ON SUNDAYS What changed:  when to take this additional instructions   magnesium  oxide 400 (240 Mg) MG tablet Commonly known as: MAG-OX Take 400 mg by mouth at bedtime.   metFORMIN  500 MG tablet Commonly known as: GLUCOPHAGE  Take 2 tablets (1,000 mg total) by mouth 2 (two) times daily with a meal.   tirzepatide  12.5 MG/0.5ML Pen Commonly known as: MOUNJARO  Inject 12.5 mg into the skin once a week.        Follow-up Information     Leonel Cole, MD. Schedule an appointment as soon as possible for a visit in 1 week(s).   Specialty: Family Medicine Contact information: [address] [place] [address] [phone]         Watt Rush, MD. Go to.   Specialty: Urology Contact information: [address] [place] [address] [phone]                 Discharge Instructions     Discharge instructions   Complete by: As directed    **IMPORTANT DISCHARGE INSTRUCTIONS**   From Dr. Jonel: You were admitted for sepsis from a urinary tract infection from an infected kidney stone, which spread to the blood stream  It was a gram negative rod infection (by E coli) which  is a common cause of urinary tract infections (the most common cause in fact)  Take the antibiotic cefadroxil  500 mg twice daily for 7 more days  Go see Dr. Watt this week  While you were here, you had a CAT scan of the chest abdomen and pelvis.  This helped diagnose the kidney stone and blockage caused by the kidney stone.  The only other abnormality we saw in the chest, belly or pelvis of note was some asymmetric tissue in the left breast.  We recommend Dr. Leonel order you a nonemergent mammogram (unless this has already recently been done)  You also had an echocardiogram here that showed normal heart function and normal heart valves  FOR THE NEXT WEEK: Hold (do not take) your lisinopril  or indapamide Your blood pressure was low here and we want to give some time to let this resolve Whjen you see Dr. Leonel,  he can restart this  Also, probably for the next week, don't take oxybutynin  if you can manage.  This medicine relaxes the bladder and allows it to retain urine. When you're trying to clear a urinary tract infection, it's probably best not to retain urine   Increase activity slowly   Complete by: As directed    No wound care   Complete by: As directed        Discharge Exam: Filed Weights   09/09/23 0500 09/10/23 0500 09/11/23 0410  Weight: (!) 142.6 kg (!) 144 kg (!) 138.7 kg    General: Pt is alert, awake, not in acute distress Cardiovascular: RRR, nl S1-S2, no murmurs appreciated.   No LE edema.   Respiratory: Normal respiratory rate and rhythm.  CTAB without rales or wheezes. Abdominal: Abdomen soft and non-tender.  No distension or HSM.   Neuro/Psych: Strength symmetric in upper and lower extremities.  Judgment and insight appear normal.   Condition at discharge: good  The results of significant diagnostics from this hospitalization (including imaging, microbiology, ancillary and laboratory) are listed below for reference.   Imaging Studies: ECHOCARDIOGRAM  COMPLETE Result Date: 09/08/2023    ECHOCARDIOGRAM REPORT   Patient Name:   Joan Walker Date of Exam: 09/08/2023 Medical Rec #:  [id number]        Height:       67.0 in Accession #:    [id number]       Weight:       316.0 lb Date of Birth:  07/04/1975        BSA:          2.456 m Patient Age:    48 years         BP:           88/35 mmHg Patient Gender: F                HR:           99 bpm. Exam Location:  Inpatient Procedure: 2D Echo, Cardiac Doppler and Color Doppler (Both Spectral and Color            Flow Doppler were utilized during procedure). Indications:    CHF  History:        Patient has no prior history of Echocardiogram examinations.                 Risk Factors:Diabetes.  Sonographer:    Philomena Daring Referring Phys: [id number] SUDHAM CHAND IMPRESSIONS  1. Left ventricular ejection fraction, by estimation, is 60 to 65%. The left ventricle has normal function. The left ventricle has no regional wall motion abnormalities. Left ventricular diastolic parameters were normal.  2. Right ventricular systolic function is normal. The right ventricular size is normal. Tricuspid regurgitation signal is inadequate for assessing PA pressure.  3. The mitral valve is normal in structure. Trivial mitral valve regurgitation. No evidence of mitral stenosis.  4. The aortic valve is normal in structure. Aortic valve regurgitation is not visualized. No aortic stenosis is present.  5. The inferior vena cava is dilated in size with <50% respiratory variability, suggesting right atrial pressure of 15 mmHg. FINDINGS  Left Ventricle: Left ventricular ejection fraction, by estimation, is 60 to 65%. The left ventricle has normal function. The left ventricle has no regional wall motion abnormalities. The left ventricular internal cavity size was normal in size. There is  no left ventricular hypertrophy. Left ventricular diastolic parameters were normal. Right Ventricle: The right ventricular  size is normal. No increase in right  ventricular wall thickness. Right ventricular systolic function is normal. Tricuspid regurgitation signal is inadequate for assessing PA pressure. Left Atrium: Left atrial size was normal in size. Right Atrium: Right atrial size was normal in size. Pericardium: There is no evidence of pericardial effusion. Mitral Valve: The mitral valve is normal in structure. Trivial mitral valve regurgitation. No evidence of mitral valve stenosis. Tricuspid Valve: The tricuspid valve is normal in structure. Tricuspid valve regurgitation is not demonstrated. No evidence of tricuspid stenosis. Aortic Valve: The aortic valve is normal in structure. Aortic valve regurgitation is not visualized. No aortic stenosis is present. Pulmonic Valve: The pulmonic valve was normal in structure. Pulmonic valve regurgitation is not visualized. No evidence of pulmonic stenosis. Aorta: The aortic root is normal in size and structure. Venous: The inferior vena cava is dilated in size with less than 50% respiratory variability, suggesting right atrial pressure of 15 mmHg. IAS/Shunts: No atrial level shunt detected by color flow Doppler.  LEFT VENTRICLE PLAX 2D LVIDd:         4.68 cm     Diastology LVIDs:         2.86 cm     LV e' medial:    7.62 cm/s LV PW:         1.11 cm     LV E/e' medial:  12.5 LV IVS:        1.13 cm     LV e' lateral:   16.10 cm/s LVOT diam:     2.01 cm     LV E/e' lateral: 5.9 LV SV:         70 LV SV Index:   29 LVOT Area:     3.17 cm  LV Volumes (MOD) LV vol d, MOD A2C: 55.4 ml LV vol d, MOD A4C: 73.4 ml LV vol s, MOD A2C: 21.8 ml LV vol s, MOD A4C: 29.6 ml LV SV MOD A2C:     33.6 ml LV SV MOD A4C:     73.4 ml LV SV MOD BP:      36.8 ml RIGHT VENTRICLE             IVC RV S prime:     11.60 cm/s  IVC diam: 2.46 cm TAPSE (M-mode): 1.8 cm LEFT ATRIUM             Index        RIGHT ATRIUM           Index LA diam:        3.45 cm 1.40 cm/m   RA Area:     12.70 cm LA Vol (A2C):   42.1 ml 17.14 ml/m  RA Volume:   31.70 ml  12.91  ml/m LA Vol (A4C):   35.8 ml 14.58 ml/m LA Biplane Vol: 41.1 ml 16.73 ml/m  AORTIC VALVE LVOT Vmax:   121.00 cm/s LVOT Vmean:  85.200 cm/s LVOT VTI:    0.221 m  AORTA Ao Root diam: 2.32 cm Ao Asc diam:  3.18 cm MITRAL VALVE MV Area (PHT): 3.31 cm    SHUNTS MV Decel Time: 229 msec    Systemic VTI:  0.22 m MV E velocity: 94.90 cm/s  Systemic Diam: 2.01 cm MV A velocity: 60.30 cm/s MV E/A ratio:  1.57 Mihai Croitoru MD Electronically signed by Jerel Balding MD Signature Date/Time: 09/08/2023/3:34:02 PM    Final    DG CHEST PORT 1 VIEW Result Date: 09/08/2023 CLINICAL DATA:  10026.  Shortness of breath and sepsis. EXAM: PORTABLE CHEST 1 VIEW COMPARISON:  Portable chest yesterday at 3:15 p.m. FINDINGS: 3:36 a.m. There is a right IJ central line terminating at the brachiocephalic/SVC junction. Overlying monitor wires. The lungs are near expiratory. There is linear perihilar atelectasis. No focal pneumonia is seen with limited view of the bases. No pleural effusion is evident. The cardiac size is normal. The mediastinum normally outlined. No new osseous findings. Overall aeration is unchanged.  No new abnormality. IMPRESSION: 1. No significant change from yesterday's study. 2. Low-inspiration study with no acute radiographic chest finding. 3. Right IJ central line terminates at the brachiocephalic/SVC junction. Electronically Signed   By: Francis Quam M.D.   On: 09/08/2023 03:53   DG C-Arm 1-60 Min Result Date: 09/07/2023 CLINICAL DATA:  Retrograde pyelogram stent insertion EXAM: DG C-ARM 1-60 MIN FLUOROSCOPY: Fluoroscopy Time:  5.3 seconds Radiation Exposure Index (if provided by the fluoroscopic device): 2.99 mGy Number of Acquired Spot Images: 1 COMPARISON:  CT 09/07/2023 FINDINGS: Only a single low resolution spot image of the left pelvis is submitted for interpretation. There is a catheter or possibly stent to the left of midline chromophobe the proximal or distal ends are non included. Correlate with  findings at time of imaging. IMPRESSION: Intraoperative fluoroscopic assistance provided during pyelogram and stent insertion Electronically Signed   By: Luke Bun M.D.   On: 09/07/2023 20:26   CT CHEST ABDOMEN PELVIS WO CONTRAST Result Date: 09/07/2023 CLINICAL DATA:  Sepsis EXAM: CT CHEST, ABDOMEN AND PELVIS WITHOUT CONTRAST TECHNIQUE: Multidetector CT imaging of the chest, abdomen and pelvis was performed following the standard protocol without IV contrast. RADIATION DOSE REDUCTION: This exam was performed according to the departmental dose-optimization program which includes automated exposure control, adjustment of the mA and/or kV according to patient size and/or use of iterative reconstruction technique. COMPARISON:  December 19, 2021 FINDINGS: Of note, the lack of intravenous contrast limits evaluation of the solid organ parenchyma and vascularity. CT CHEST FINDINGS Cardiovascular: No cardiomegaly or pericardial effusion.No aortic aneurysm. Right IJ approach central venous catheter terminates at the right subclavian and IJ confluence. Mediastinum/Nodes: No mediastinal mass.No mediastinal, hilar, or axillary lymphadenopathy. Lungs/Pleura: The midline trachea and bronchi are patent. No focal airspace consolidation, pleural effusion, or pneumothorax. CT ABDOMEN PELVIS FINDINGS Hepatobiliary: No mass.Diffuse hepatic steatosis.Cholecystectomy. No intrahepatic or extrahepatic biliary ductal dilation. Pancreas: No mass or main ductal dilation. No peripancreatic inflammation or fluid collection. Spleen: Normal size. No mass. Adrenals/Urinary Tract: No adrenal masses. No renal mass. 2 x 3 x 3 mm calculus in the distal left ureter causing mild left-sided hydroureteronephrosis. The left kidney is edematous with perinephric stranding. The urinary bladder is completely decompressed. Stomach/Bowel: The stomach contains ingested material without focal abnormality. No small bowel wall thickening or inflammation. No  small bowel obstruction.Normal appendix. Vascular/Lymphatic: No aortic aneurysm. No intraabdominal or pelvic lymphadenopathy. Reproductive: The uterus and ovaries are within normal limits for patient's age. No free pelvic fluid. Other: No pneumoperitoneum, ascites, or mesenteric inflammation. Musculoskeletal: No acute fracture or destructive lesion. Asymmetric soft tissue in the lateral left breast (for example, axial 22). IMPRESSION: 1. In the distal left ureter, there is a 2 x 3 x 3 mm calculus, causing mild left-sided hydroureteronephrosis. Correlation with urinalysis recommended. 2. No pneumonia, pulmonary edema, or pleural effusion. 3. Asymmetric soft tissue in the lateral left breast (for example, axial image 22). While this likely represents asymmetric glandular breast tissue, nonemergent follow-up with mammography recommended to exclude underlying breast  lesion. Electronically Signed   By: Rogelia Myers M.D.   On: 09/07/2023 16:46   DG Chest Portable 1 View Result Date: 09/07/2023 CLINICAL DATA:  Central venous catheter placement EXAM: PORTABLE CHEST 1 VIEW COMPARISON:  1825 FINDINGS: Single frontal view of the chest demonstrates right internal jugular central venous catheter, tip overlying the right brachiocephalic confluence. Cardiac silhouette is stable. No acute airspace disease, effusion, or pneumothorax. No acute bony abnormalities. IMPRESSION: 1. Right internal jugular catheter, tip overlying the right brachiocephalic confluence. 2. No acute intrathoracic process. Electronically Signed   By: Ozell Daring M.D.   On: 09/07/2023 15:45   DG Chest Portable 1 View Result Date: 09/07/2023 CLINICAL DATA:  Generalized weakness. EXAM: PORTABLE CHEST 1 VIEW COMPARISON:  05/25/2023 FINDINGS: The lungs are clear without focal pneumonia, edema, pneumothorax or pleural effusion. The cardiopericardial silhouette is within normal limits for size. No acute bony abnormality. Telemetry leads overlie the chest.  IMPRESSION: No active disease. Electronically Signed   By: Camellia Candle M.D.   On: 09/07/2023 09:42   MM 3D DIAGNOSTIC MAMMOGRAM BILATERAL BREAST Result Date: 09/04/2023 CLINICAL DATA:  One year follow-up of LEFT breast asymmetry. Annual RIGHT mammogram. EXAM: DIGITAL DIAGNOSTIC BILATERAL MAMMOGRAM WITH TOMOSYNTHESIS AND CAD TECHNIQUE: Bilateral digital diagnostic mammography and breast tomosynthesis was performed. The images were evaluated with computer-aided detection. COMPARISON:  Previous exam(s). ACR Breast Density Category c: The breasts are heterogeneously dense, which may obscure small masses. FINDINGS: RIGHT: Mammogram: No suspicious mass, distortion, or microcalcifications are identified to suggest presence of malignancy. LEFT: Mammogram: Previously seen asymmetry in the upper-outer LEFT breast is not significantly changed. No new suspicious mass, distortion, or microcalcifications are identified to suggest presence of malignancy. IMPRESSION: 1. Probably benign LEFT upper outer breast asymmetry has been for 1 year. 2. No evidence of RIGHT breast malignancy. RECOMMENDATION: BILATERAL diagnostic mammogram in 1 year. I have discussed the findings and recommendations with the patient. If applicable, a reminder letter will be sent to the patient regarding the next appointment. BI-RADS CATEGORY  3: Probably benign. Electronically Signed   By: Aliene Lloyd M.D.   On: 09/04/2023 08:22    Microbiology: Results for orders placed or performed during the hospital encounter of 09/07/23  Resp panel by RT-PCR (RSV, Flu A&B, Covid) Anterior Nasal Swab     Status: None   Collection Time: 09/07/23  9:07 AM   Specimen: Anterior Nasal Swab  Result Value Ref Range Status   SARS Coronavirus 2 by RT PCR NEGATIVE NEGATIVE Final   Influenza A by PCR NEGATIVE NEGATIVE Final   Influenza B by PCR NEGATIVE NEGATIVE Final    Comment: (NOTE) The Xpert Xpress SARS-CoV-2/FLU/RSV plus assay is intended as an aid in the  diagnosis of influenza from Nasopharyngeal swab specimens and should not be used as a sole basis for treatment. Nasal washings and aspirates are unacceptable for Xpert Xpress SARS-CoV-2/FLU/RSV testing.  Fact Sheet for Patients: BloggerCourse.com  Fact Sheet for Healthcare Providers: SeriousBroker.it  This test is not yet approved or cleared by the [place]  FDA and has been authorized for detection and/or diagnosis of SARS-CoV-2 by FDA under an Emergency Use Authorization (EUA). This EUA will remain in effect (meaning this test can be used) for the duration of the COVID-19 declaration under Section 564(b)(1) of the Act, 21 U.S.C. section 360bbb-3(b)(1), unless the authorization is terminated or revoked.     Resp Syncytial Virus by PCR NEGATIVE NEGATIVE Final    Comment: (NOTE) Fact Sheet for Patients: BloggerCourse.com  Fact Sheet for Healthcare Providers: SeriousBroker.it  This test is not yet approved or cleared by the [place]  FDA and has been authorized for detection and/or diagnosis of SARS-CoV-2 by FDA under an Emergency Use Authorization (EUA). This EUA will remain in effect (meaning this test can be used) for the duration of the COVID-19 declaration under Section 564(b)(1) of the Act, 21 U.S.C. section 360bbb-3(b)(1), unless the authorization is terminated or revoked.  Performed at [facility], [address], [place], [place] [address]   Blood culture (routine x 2)     Status: Abnormal   Collection Time: 09/07/23 11:12 AM   Specimen: BLOOD  Result Value Ref Range Status   Specimen Description BLOOD SITE NOT SPECIFIED  Final   Special Requests   Final    BOTTLES DRAWN AEROBIC AND ANAEROBIC Blood Culture results may not be optimal due to an inadequate volume of blood received in culture bottles   Culture  Setup Time   Final    GRAM NEGATIVE RODS IN  BOTH AEROBIC AND ANAEROBIC BOTTLES CRITICAL RESULT CALLED TO, READ BACK BY AND VERIFIED WITH: MAYA LYNWOOD MOHR [id number] AT 0252, ADC Performed at [facility], [address] [place], [place] [address]    Culture ESCHERICHIA COLI (A)  Final   Report Status 09/10/2023 FINAL  Final   Organism ID, Bacteria ESCHERICHIA COLI  Final      Susceptibility   Escherichia coli - MIC*    AMPICILLIN 4 SENSITIVE Sensitive     CEFAZOLIN (NON-URINE) 2 SENSITIVE Sensitive     CEFEPIME  <=0.12 SENSITIVE Sensitive     ERTAPENEM <=0.12 SENSITIVE Sensitive     CEFTRIAXONE  <=0.25 SENSITIVE Sensitive     CIPROFLOXACIN <=0.06 SENSITIVE Sensitive     GENTAMICIN <=1 SENSITIVE Sensitive     MEROPENEM <=0.25 SENSITIVE Sensitive     TRIMETH/SULFA <=20 SENSITIVE Sensitive     AMPICILLIN/SULBACTAM <=2 SENSITIVE Sensitive     PIP/TAZO Value in next row Sensitive ug/mL     <=4 SENSITIVEThis is a modified FDA-approved test that has been validated and its performance characteristics determined by the reporting laboratory.  This laboratory is certified under the Clinical Laboratory Improvement Amendments CLIA as qualified to perform high complexity clinical laboratory testing.    * ESCHERICHIA COLI  Blood Culture ID Panel (Reflexed)     Status: Abnormal   Collection Time: 09/07/23 11:12 AM  Result Value Ref Range Status   Enterococcus faecalis NOT DETECTED NOT DETECTED Final   Enterococcus Faecium NOT DETECTED NOT DETECTED Final   Listeria monocytogenes NOT DETECTED NOT DETECTED Final   Staphylococcus species NOT DETECTED NOT DETECTED Final   Staphylococcus aureus (BCID) NOT DETECTED NOT DETECTED Final   Staphylococcus epidermidis NOT DETECTED NOT DETECTED Final   Staphylococcus lugdunensis NOT DETECTED NOT DETECTED Final   Streptococcus species NOT DETECTED NOT DETECTED Final   Streptococcus agalactiae NOT DETECTED NOT DETECTED Final   Streptococcus pneumoniae NOT DETECTED NOT DETECTED Final   Streptococcus  pyogenes NOT DETECTED NOT DETECTED Final   A.calcoaceticus-baumannii NOT DETECTED NOT DETECTED Final   Bacteroides fragilis NOT DETECTED NOT DETECTED Final   Enterobacterales DETECTED (A) NOT DETECTED Final    Comment: Enterobacterales represent a large order of gram negative bacteria, not a single organism. CRITICAL RESULT CALLED TO, READ BACK BY AND VERIFIED WITH: PHARMD JAMES W. [id number] AT 0252, ADC    Enterobacter cloacae complex NOT DETECTED NOT DETECTED Final   Escherichia coli DETECTED (A) NOT DETECTED Final  Comment: CRITICAL RESULT CALLED TO, READ BACK BY AND VERIFIED WITH: PHARMD JAMES W. [id number] AT 0252, ADC    Klebsiella aerogenes NOT DETECTED NOT DETECTED Final   Klebsiella oxytoca NOT DETECTED NOT DETECTED Final   Klebsiella pneumoniae NOT DETECTED NOT DETECTED Final   Proteus species NOT DETECTED NOT DETECTED Final   Salmonella species NOT DETECTED NOT DETECTED Final   Serratia marcescens NOT DETECTED NOT DETECTED Final   Haemophilus influenzae NOT DETECTED NOT DETECTED Final   Neisseria meningitidis NOT DETECTED NOT DETECTED Final   Pseudomonas aeruginosa NOT DETECTED NOT DETECTED Final   Stenotrophomonas maltophilia NOT DETECTED NOT DETECTED Final   Candida albicans NOT DETECTED NOT DETECTED Final   Candida auris NOT DETECTED NOT DETECTED Final   Candida glabrata NOT DETECTED NOT DETECTED Final   Candida krusei NOT DETECTED NOT DETECTED Final   Candida parapsilosis NOT DETECTED NOT DETECTED Final   Candida tropicalis NOT DETECTED NOT DETECTED Final   Cryptococcus neoformans/gattii NOT DETECTED NOT DETECTED Final   CTX-M ESBL NOT DETECTED NOT DETECTED Final   Carbapenem resistance IMP NOT DETECTED NOT DETECTED Final   Carbapenem resistance KPC NOT DETECTED NOT DETECTED Final   Carbapenem resistance NDM NOT DETECTED NOT DETECTED Final   Carbapenem resist OXA 48 LIKE NOT DETECTED NOT DETECTED Final   Carbapenem resistance VIM NOT DETECTED NOT DETECTED Final     Comment: Performed at [facility], [address], [place], [place] [address]  Blood culture (routine x 2)     Status: Abnormal   Collection Time: 09/07/23 11:15 AM   Specimen: BLOOD  Result Value Ref Range Status   Specimen Description BLOOD SITE NOT SPECIFIED  Final   Special Requests   Final    BOTTLES DRAWN AEROBIC AND ANAEROBIC Blood Culture results may not be optimal due to an inadequate volume of blood received in culture bottles   Culture  Setup Time   Final    GRAM NEGATIVE RODS IN BOTH AEROBIC AND ANAEROBIC BOTTLES CRITICAL VALUE NOTED.  VALUE IS CONSISTENT WITH PREVIOUSLY REPORTED AND CALLED VALUE.    Culture (A)  Final    ESCHERICHIA COLI SUSCEPTIBILITIES PERFORMED ON PREVIOUS CULTURE WITHIN THE LAST 5 DAYS. Performed at [facility], [address], [place], [place] [address]    Report Status 09/10/2023 FINAL  Final  MRSA Next Gen by PCR, Nasal     Status: None   Collection Time: 09/07/23 10:17 PM   Specimen: Nasal Mucosa; Nasal Swab  Result Value Ref Range Status   MRSA by PCR Next Gen NOT DETECTED NOT DETECTED Final    Comment: (NOTE) The GeneXpert MRSA Assay (FDA approved for NASAL specimens only), is one component of a comprehensive MRSA colonization surveillance program. It is not intended to diagnose MRSA infection nor to guide or monitor treatment for MRSA infections. Test performance is not FDA approved in patients less than 75 years old. Performed at [facility], [address] [place], [place] [address]     Labs: CBC: Recent Labs  Lab 09/07/23 [id number] 09/07/23 9062 09/07/23 2217 09/08/23 0521 09/09/23 0529 09/10/23 0553 09/11/23 0528  WBC 18.4*  --  16.7* 13.5* 8.8 8.0 10.4  NEUTROABS 15.8*  --   --   --   --   --   --   HGB 11.3*   < > 10.2* 10.3* 9.4* 8.4* 9.2*  HCT 35.5*   < > 31.8* 32.0* 28.9* 26.0* 28.4*  MCV 88.3  --  87.8 87.4 87.0 86.7 85.3  PLT 244  --  189 188 142* 145* 196   < > = values in this interval not  displayed.   Basic Metabolic Panel: Recent Labs  Lab 09/07/23 0907 09/07/23 0937 09/07/23 2217 09/08/23 0521 09/09/23 0529 09/10/23 0553 09/11/23 0528  NA 132* 132*  --  135 136 134* 137  K 4.1 4.0  --  3.5 3.5 3.5 3.6  CL 95*  --   --  100 104 98 103  CO2 18*  --   --  20* 21* 24 25  GLUCOSE 267*  --   --  205* 222* 212* 166*  BUN 21*  --   --  24* 24* 17 11  CREATININE 2.23*  --  1.69* 1.57* 1.49* 1.20* 1.06*  CALCIUM  8.4*  --   --  8.0* 7.8* 7.8* 8.1*  MG  --   --   --  1.3* 2.4 2.2  --   PHOS  --   --   --  3.8  --   --   --    Liver Function Tests: Recent Labs  Lab 09/07/23 0907  AST 50*  ALT 37  ALKPHOS 46  BILITOT 1.2  PROT 6.2*  ALBUMIN  2.9*   CBG: Recent Labs  Lab 09/11/23 0721 09/11/23 1135 09/11/23 1645 09/11/23 2146 09/12/23 0739  GLUCAP 188* 235* 246* 186* 172*    Discharge time spent: approximately 45 minutes spent on discharge counseling, evaluation of patient on day of discharge, and coordination of discharge planning with nursing, social work, pharmacy and case management  Signed: Lonni SHAUNNA Dalton, MD [facility] 09/12/2023

## 2023-09-14 ENCOUNTER — Ambulatory Visit: Admitting: Dietician

## 2023-09-23 IMAGING — MG MM DIGITAL SCREENING BILAT W/ TOMO AND CAD
6 of 12 series · 6 of 36 positions shown · non-contrast
Comparison: None.

CLINICAL DATA: Screening. This is the patient's initial baseline
mammogram.

EXAM:
DIGITAL SCREENING BILATERAL MAMMOGRAM WITH TOMOSYNTHESIS AND CAD
TECHNIQUE: Bilateral screening digital craniocaudal and mediolateral oblique
mammograms were obtained. Bilateral screening digital breast
tomosynthesis was performed. The images were evaluated with
computer-aided detection.

[L MLO synth-2D (1 of 2)]
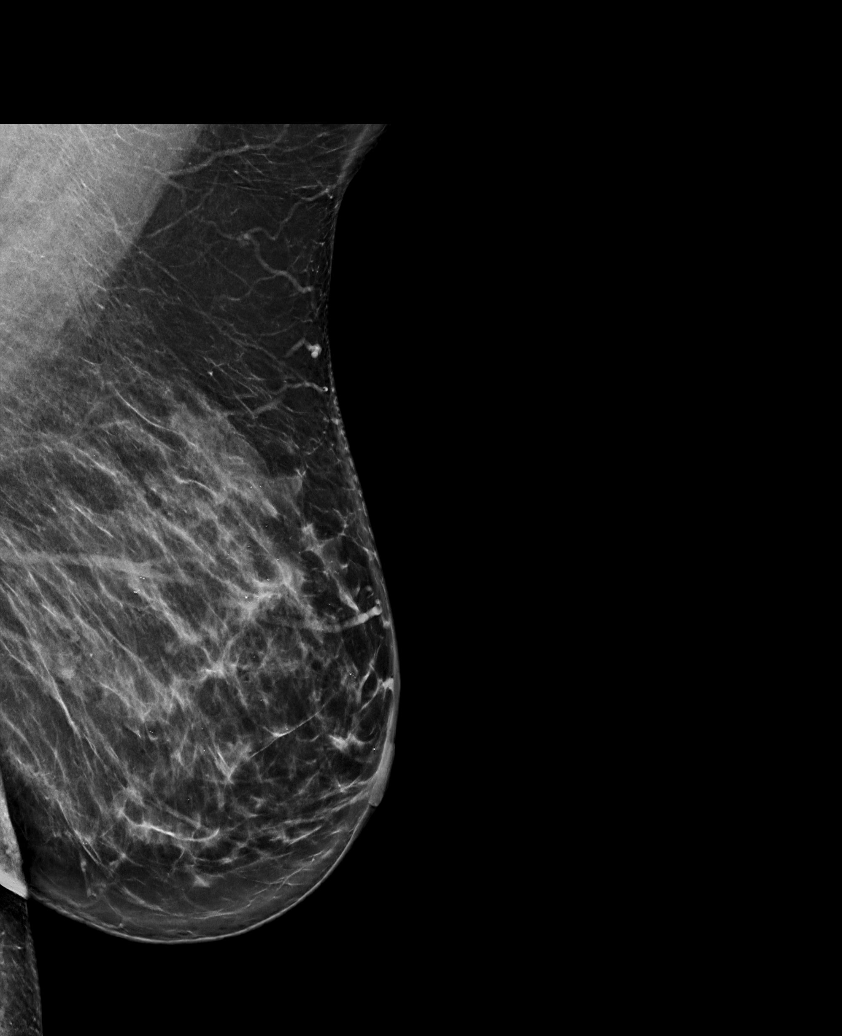

[R MLO synth-2D (1 of 2)]
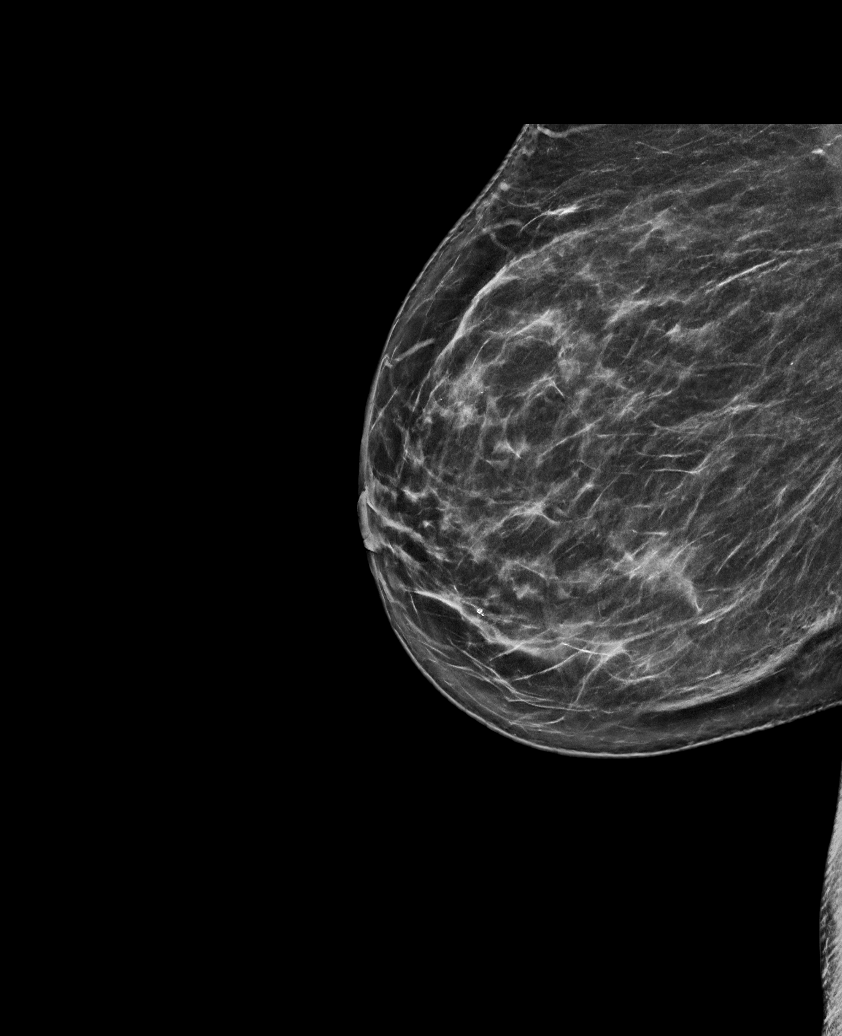

[L MLO synth-2D (2 of 2)]
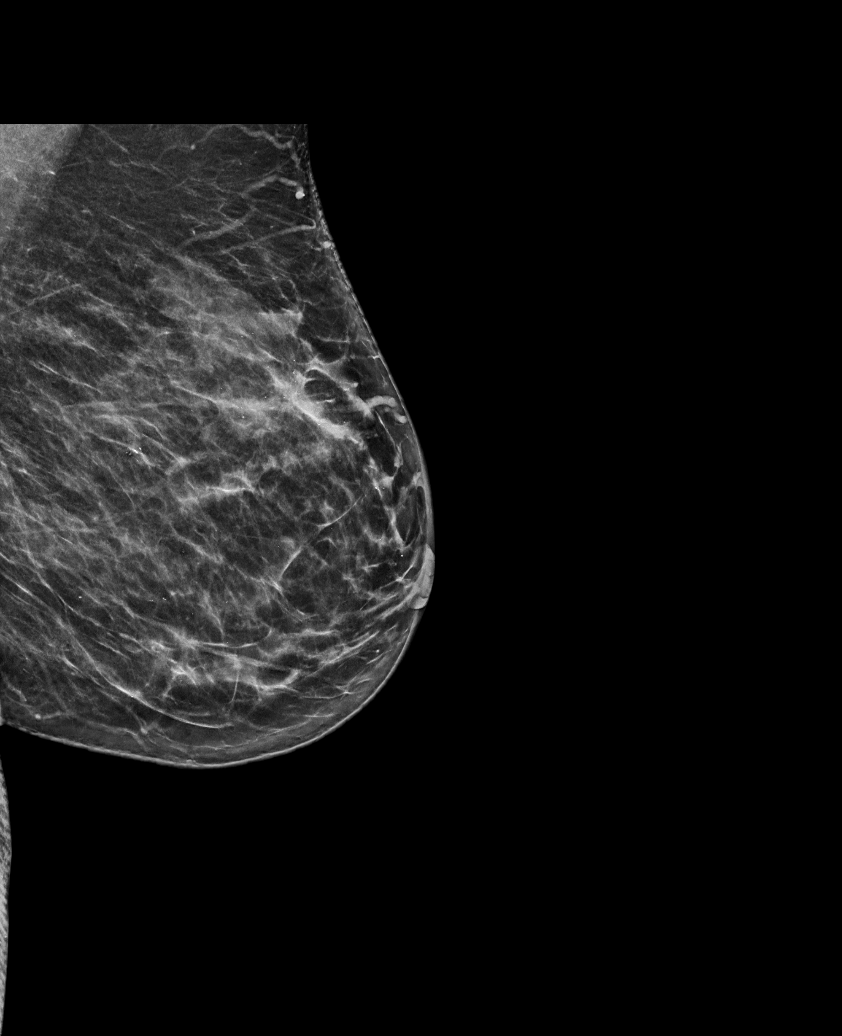

[R MLO synth-2D (2 of 2)]
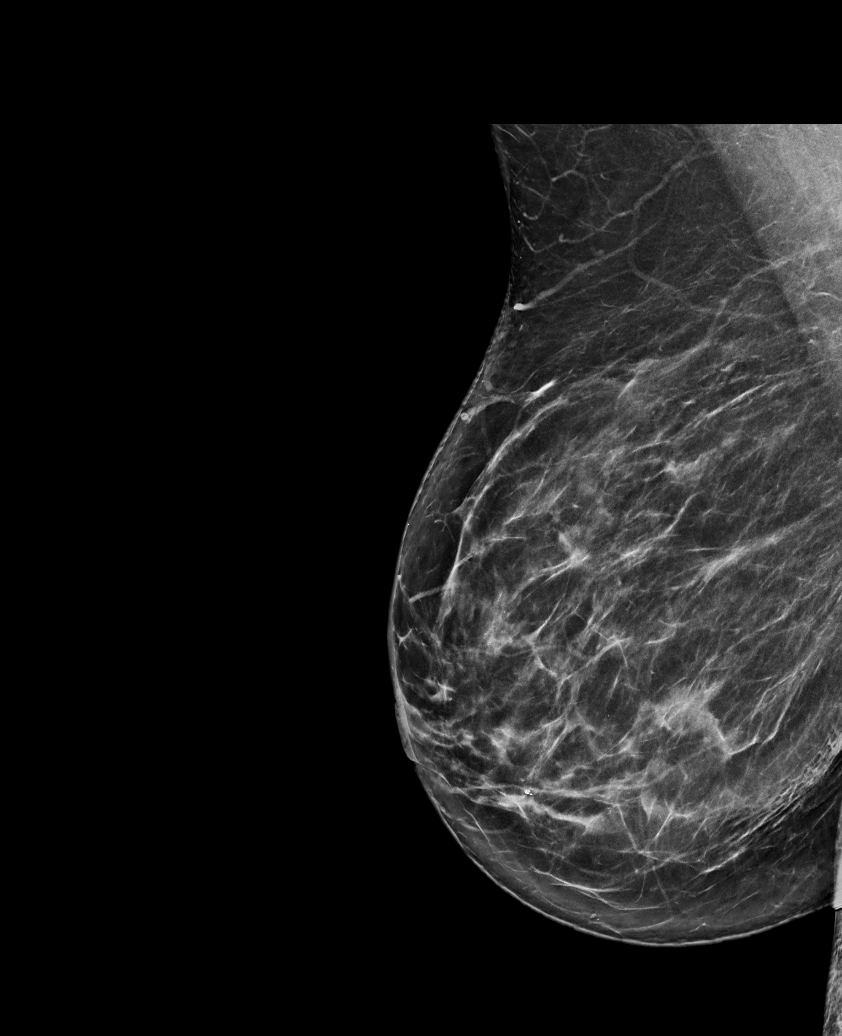

[R CC synth-2D]
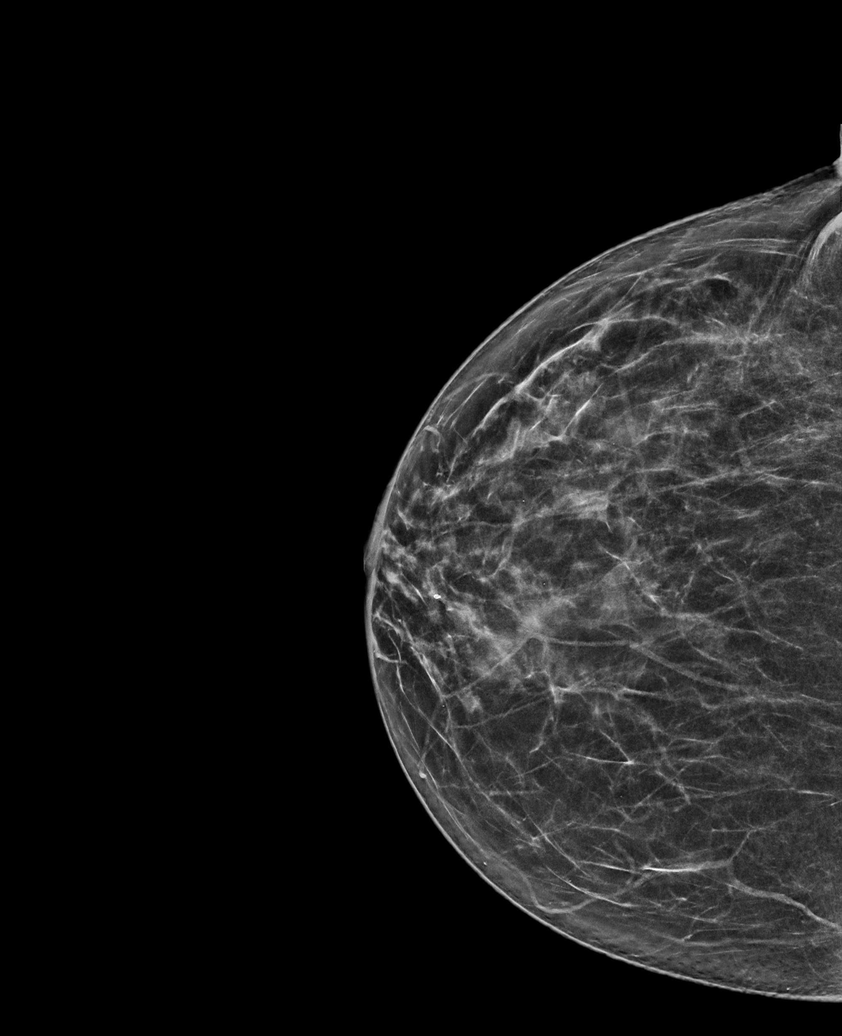

[L CC synth-2D]
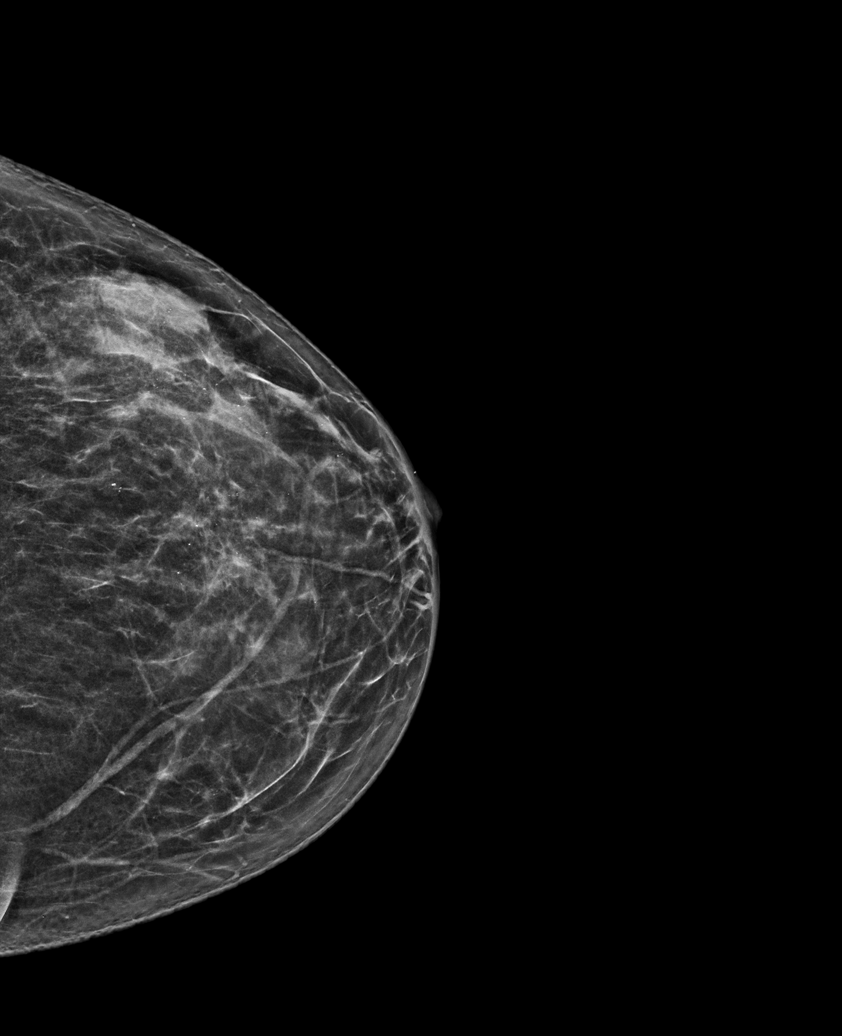

[6 of 36 positions shown; findings below may reference images not displayed]

ACR Breast Density Category b: There are scattered areas of
fibroglandular density.
FINDINGS: There are no findings suspicious for malignancy.
IMPRESSION: No mammographic evidence of malignancy. A result letter of this
screening mammogram will be mailed directly to the patient.

RECOMMENDATION:
Screening mammogram in one year. (Code:91-A-9VU)

BI-RADS CATEGORY  1: Negative.

## 2023-09-28 ENCOUNTER — Encounter: Payer: Self-pay | Admitting: Skilled Nursing Facility1

## 2023-09-28 ENCOUNTER — Ambulatory Visit: Admitting: Dietician

## 2023-09-28 ENCOUNTER — Encounter: Attending: Surgery | Admitting: Skilled Nursing Facility1

## 2023-09-28 VITALS — Ht 67.0 in | Wt 299.7 lb

## 2023-09-28 DIAGNOSIS — E119 Type 2 diabetes mellitus without complications: Secondary | ICD-10-CM | POA: Diagnosis present

## 2023-09-28 NOTE — Progress Notes (Unsigned)
 Supervised Weight Loss Visit Bariatric Nutrition Education Appt Start Time: 5:30 End Time: 6:00  Planned surgery: RYGB Pt expectation of surgery: to be about 180 lbs  Referral stated Supervised Weight Loss (SWL) visits needed: 12  8 out of 12 SWL Appointments   NUTRITION ASSESSMENT   Anthropometrics  Start weight at [facility]: 320.6 lbs (date: 05/21/2023)  Height: 66.5 in Weight today: 299.5 lb BMI: 46.94  Clinical   Pharmacotherapy: History of weight loss medication used: Mounjaro  (for diabetes)  Medical hx: T2DM, hypercholesterolemia, obesity; kidney stones Medications: Mounjaro , glimepiride , indapamide, oxybutynin , levothyroxine , metformin , lisinopril , atorvastatin  Labs: A1C 6.2, creatinine 1.06, calcium  8.1, HCT 28.4, hemoglobin 9.2, RBC 3.33 Notable signs/symptoms: none noted Any previous deficiencies? No  Lifestyle & Dietary Hx  Pt states she does not check her blood sugars. Dietitian discussed checking her blood sugar to ensure the hot flashes she experiences are not low blood sugars: pt states she does not like pricking herself.  Pt states she just got out of the hospital due to sepsis from a kidney stone. Pt states she gets dizzy easily. Pt states before she went to the emergency room she slept for 36 hours. Pt states when she was in the hospital she was having hallucinations. Pt state she thinks she is almost up to 3 bottles of water  per day. Pt states she has been struggling to sleep at night.   Estimated daily fluid intake: 50.7 oz Daily protein: not tracking Supplements: magnesium , Vit D, daily multivitamin Current average weekly physical activity: ADL's  24-Hr Dietary Recall First Meal: egg with keto toast or toast and peanut butter or cereal Snack:  Second Meal: leftovers (lasagna or spinach/antichoke dip) or peanut butter jelly sandwich  Snack: no Third Meal: macaroni and cheese or bunless hamburger with potatoes and carrots and onion Snack:  triscuits Beverages: water , Gatorade zero  Alcoholic beverages per week: 0 (less than one a month)  Estimated Energy Needs Calories: 1500  NUTRITION DIAGNOSIS  Overweight/obesity ([place]-3.3) related to past poor dietary habits and physical inactivity as evidenced by patient w/ planned RYGB surgery following dietary guidelines for continued weight loss.  NUTRITION INTERVENTION  Nutrition counseling (C-1) and education (E-2) to facilitate bariatric surgery goals.  Pre-Op Goals Reviewed with the Patient  Purpose of hydration: Water  makes up over 50% of your total body water , and is part of many organs throughout the body. Water  is essential to transport digested nutrients, regulate body temperature, rid the body of waste products, and protects joints and the spinal cord. When not properly hydrated you will begin to experience headaches, cramps and dizziness. Further dehydration can result in rapid heart rate, shock, oliguria, and may cause seizures.  https://www.merckmanuals.com/home/hormonal-and-metabolic-disordehttps://www.usgs.gov/special-topic/water -science-school/science/water -you-water -and-human-body?qt-science_center_objects=0#qt-science_center_objectsrs/water -balance/about-body-water  FriendLock.it InvestmentInstructor.com.cy FurEliminator.es https://www.health.BasicFM.no   Pre-Op Goals Progress & New Goals Continue: taking vit D, and  regular women's multivitamin Continue: cutting out grazing between meals and at night Continue: schedule meals and snacks Continue: practice not drinking with meals; take small bites, chew well, slow down at meal/snack time. Continue: cut out Sprite Continue: increase physical activity; aim for 3 days per week, for 20 minutes per day (5-10 minutes at a time  or walking the dog 20 minutes); increase intensity as able Continue: Aim for satisfaction before fullness Continue: Use the Bariatric MyPlate for portion control; increase non-starchy vegetables and still have some complex carbohydrates Continue: track protein; aim for 60 grams per day NEW: drink 80+ fluid ounces daily to reduce risk of kidney stones NEW: check your blood sugars to ensure you are not  getting readings below 70  Handouts Provided Include    Learning Style & Readiness for Change Teaching method utilized: Visual & Auditory  Demonstrated degree of understanding via: Teach Back  Readiness Level: preparation Barriers to learning/adherence to lifestyle change: physical activity/ mobility (arthritis)  RD's Notes for next Visit  Patient progress toward chosen goals  MONITORING & EVALUATION Dietary intake, weekly physical activity, body weight, and pre-op goals  Next Steps  Patient is to return to [facility] in 1-2 weeks for next SWL visit.

## 2023-10-09 ENCOUNTER — Other Ambulatory Visit: Payer: Self-pay | Admitting: Endocrinology

## 2023-10-09 DIAGNOSIS — E1165 Type 2 diabetes mellitus with hyperglycemia: Secondary | ICD-10-CM

## 2023-10-12 ENCOUNTER — Encounter: Payer: Self-pay | Admitting: Dietician

## 2023-10-12 ENCOUNTER — Encounter: Attending: Surgery | Admitting: Dietician

## 2023-10-12 VITALS — Ht 66.5 in | Wt 297.1 lb

## 2023-10-12 DIAGNOSIS — E119 Type 2 diabetes mellitus without complications: Secondary | ICD-10-CM | POA: Insufficient documentation

## 2023-10-12 DIAGNOSIS — E669 Obesity, unspecified: Secondary | ICD-10-CM | POA: Insufficient documentation

## 2023-10-12 NOTE — Progress Notes (Signed)
 Supervised Weight Loss Visit Bariatric Nutrition Education Appt Start Time: 1718 End Time: 1741  Planned surgery: RYGB Pt expectation of surgery: to be about 180 lbs  Referral stated Supervised Weight Loss (SWL) visits needed: 12  9 out of 12 SWL Appointments   NUTRITION ASSESSMENT   Anthropometrics  Start weight at [facility]: 320.6 lbs (date: 05/21/2023)  Height: 66.5 in Weight today: 297.1 lb BMI: 47.23 kg/m   Clinical   Pharmacotherapy: History of weight loss medication used: Mounjaro  (for diabetes)  Medical hx: T2DM, hypercholesterolemia, obesity; kidney stones Medications: Mounjaro , glimepiride , indapamide, oxybutynin , levothyroxine , metformin , lisinopril , atorvastatin  Labs: A1C 6.2, creatinine 1.06, calcium  8.1, HCT 28.4, hemoglobin 9.2, RBC 3.33 Notable signs/symptoms: none noted Any previous deficiencies? No  Lifestyle & Dietary Hx  Pt states she has mastered not drinking with her meals. Pt states she has not been checking her blood glucose, stating she does not like to prick her finger. Pt states she has not had any headaches and not rushing to the bathroom. Pt states her typical symptoms of her high blood sugars have not been there. Pt states her Mounjaro  kicked in and caused nausea.  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  24-Hr Dietary Recall First Meal: egg with keto toast or toast and peanut butter or cereal or oatmeal Snack:  Second Meal: skip or leftovers (lasagna or spinach/antichoke dip) or peanut butter jelly sandwich or pistachios and cole slaw or [employer] wings and cole slaw Snack: no Third Meal: macaroni and cheese or bunless hamburger with potatoes and carrots and onion or  BLT Snack: triscuits Beverages: water , Gatorade zero  Estimated Energy Needs Calories: 1500  NUTRITION DIAGNOSIS  Overweight/obesity ([place]-3.3) related to past poor dietary habits and physical inactivity as evidenced by patient w/ planned RYGB surgery following dietary guidelines for continued weight loss.  NUTRITION INTERVENTION  Nutrition counseling (C-1) and education (E-2) to facilitate bariatric surgery goals.  Pre-Op Goals Reviewed with the Patient  Eating small, frequent meals and snacks--each including a source of protein--is a key strategy for bariatric patients to support healing, manage hunger, and maintain muscle mass. This approach helps stabilize blood sugar levels, prevents overeating, and ensures the body receives consistent nutrition throughout the day. Including protein with every meal or snack promotes satiety, supports tissue repair, and helps preserve lean muscle during weight loss. These habits are especially important after surgery when portion sizes are smaller and nutritional needs are higher.  Pre-Op Goals Progress & New Goals Continue: taking vit D, and  regular women's multivitamin Continue: cutting out grazing between meals and at night Re-engage: schedule meals and snacks; set alarms on phone (set at end of appointment) Continue: practice not drinking with meals; take small bites, chew well, slow down at meal/snack time. Continue: cut out Sprite Continue: increase physical activity; aim for 3 days per week, for 20 minutes per day (5-10 minutes at a time or walking the dog 20 minutes); increase intensity as able Continue: Aim for satisfaction before fullness Continue: Use the Bariatric MyPlate for portion control; increase non-starchy vegetables and still have some complex carbohydrates New: aim for a protein with every meal or snack Continue: track protein; aim for 60 grams per day Continue: drink 80+ fluid ounces daily to reduce  risk of kidney  stones Continue: check your blood sugars to ensure you are not getting readings below 70  Handouts Provided Include    Learning Style & Readiness for Change Teaching method utilized: Visual & Auditory  Demonstrated degree of understanding via: Teach Back  Readiness Level: preparation Barriers to learning/adherence to lifestyle change: physical activity/ mobility (arthritis)  RD's Notes for next Visit  Patient progress toward chosen goals  MONITORING & EVALUATION Dietary intake, weekly physical activity, body weight, and pre-op goals  Next Steps  Patient is to return to [facility] in 1-2 weeks for next SWL visit.

## 2023-10-22 ENCOUNTER — Encounter: Payer: Self-pay | Admitting: Dietician

## 2023-10-22 ENCOUNTER — Encounter: Attending: Surgery | Admitting: Dietician

## 2023-10-22 VITALS — Ht 66.5 in | Wt 303.7 lb

## 2023-10-22 DIAGNOSIS — E669 Obesity, unspecified: Secondary | ICD-10-CM | POA: Diagnosis present

## 2023-10-22 NOTE — Progress Notes (Signed)
 Supervised Weight Loss Visit Bariatric Nutrition Education Appt Start Time: 1645 End Time: 1702  Planned surgery: RYGB Pt expectation of surgery: to be about 180 lbs  Referral stated Supervised Weight Loss (SWL) visits needed: 12  10 out of 12 SWL Appointments   NUTRITION ASSESSMENT   Anthropometrics  Start weight at [facility]: 320.6 lbs (date: 05/21/2023)  Height: 66.5 in Weight today: 303.7 lb BMI: 48.28 kg/m   Clinical   Pharmacotherapy: History of weight loss medication used: Mounjaro  (for diabetes)  Medical hx: T2DM, hypercholesterolemia, obesity; kidney stones Medications: Mounjaro , glimepiride , indapamide, oxybutynin , levothyroxine , metformin , lisinopril , atorvastatin  Labs: A1C 6.2, creatinine 1.06, calcium  8.1, HCT 28.4, hemoglobin 9.2, RBC 3.33 Notable signs/symptoms: none noted Any previous deficiencies? No  Lifestyle & Dietary Hx  Pt states her back/knees have been hurting, stating she did not do much walking since last visit. Pt states she is doing well with getting more protein, stating one day she had 75 grams in one day. Pt states she meal planned and went grocery shopping to prepare for meals and snacks. Pt states she is still using the alarms on her phone. Pt states she is mentally in a good spot right now, stating she is focused on school/work/home/health. Pt states she still needs to focus on physical activity, stating her knees sometimes can't handle it.  Estimated daily fluid intake: 64+ oz Daily protein: not tracking  Supplements: magnesium , Vit D, daily multivitamin Current average weekly physical activity: walking the bus lot with a co worker weekdays with co worker for 15-20 minutes, walk the dog on the weekends for 15-20 minutes (stating she is walking every day).  24-Hr Dietary Recall First Meal: egg with keto toast or toast and peanut butter or cereal or oatmeal Snack:  Second Meal: skip or leftovers (lasagna or spinach/antichoke dip) or peanut  butter jelly sandwich or pistachios and cole slaw or [employer] wings and cole slaw Snack: no Third Meal: macaroni and cheese or bunless hamburger with potatoes and carrots and onion or BLT Snack: triscuits Beverages: water , Gatorade zero  Estimated Energy Needs Calories: 1500  NUTRITION DIAGNOSIS  Overweight/obesity ([place]-3.3) related to past poor dietary habits and physical inactivity as evidenced by patient w/ planned RYGB surgery following dietary guidelines for continued weight loss.  NUTRITION INTERVENTION  Nutrition counseling (C-1) and education (E-2) to facilitate bariatric surgery goals.  Pre-Op Goals Reviewed with the Patient  Following the bariatric MyPlate helps guide balanced, nutrient-dense eating after surgery by emphasizing whole, minimally processed foods. The plate is structured to prioritize lean protein, which should make up half the plate, supporting muscle maintenance and satiety. The majority of the remaining half should be filled with non-starchy vegetables for fiber, vitamins, and volume, while a smaller portion is reserved for complex carbohydrates like quinoa, beans, or sweet potatoes to provide lasting energy. This approach encourages mindful eating, better portion control, and long-term success with weight management and overall health.  Pre-Op Goals Progress & New Goals Continue: taking vit D, and  regular women's multivitamin Continue: cutting out grazing between meals and at night Continue: schedule meals and snacks; set alarms on phone (set at end of appointment) Continue: practice not drinking with meals; take small bites, chew well, slow down at meal/snack time. Continue: cut out Sprite Continue: increase physical activity; aim for 3 days per week, for 20 minutes per day (5-10 minutes at a time or walking the dog 20 minutes); increase intensity as able Continue: Aim for satisfaction before fullness Re-engage: Use the Bariatric MyPlate  for portion control;  increase non-starchy vegetables and still have some complex carbohydrates Continue: aim for a protein with every meal or snack Continue: track protein; aim for 60 grams per day Continue: drink 80+ fluid ounces daily to reduce risk of kidney stones Continue: check your blood sugars to ensure you are not getting readings below 70  Handouts Provided Include    Learning Style & Readiness for Change Teaching method utilized: Visual & Auditory  Demonstrated degree of understanding via: Teach Back  Readiness Level: preparation Barriers to learning/adherence to lifestyle change: physical activity/ mobility (arthritis)  RD's Notes for next Visit  Patient progress toward chosen goals  MONITORING & EVALUATION Dietary intake, weekly physical activity, body weight, and pre-op goals  Next Steps  Patient is to return to [facility] in 1-2 weeks for next SWL visit.

## 2023-10-27 ENCOUNTER — Ambulatory Visit: Admitting: Dietician

## 2023-10-29 ENCOUNTER — Ambulatory Visit: Admitting: Dietician

## 2023-10-30 ENCOUNTER — Encounter: Payer: Self-pay | Admitting: Endocrinology

## 2023-10-30 ENCOUNTER — Other Ambulatory Visit

## 2023-10-30 ENCOUNTER — Ambulatory Visit (INDEPENDENT_AMBULATORY_CARE_PROVIDER_SITE_OTHER): Admitting: Endocrinology

## 2023-10-30 ENCOUNTER — Encounter: Attending: Surgery | Admitting: Dietician

## 2023-10-30 ENCOUNTER — Encounter: Payer: Self-pay | Admitting: Dietician

## 2023-10-30 ENCOUNTER — Ambulatory Visit: Payer: Self-pay | Admitting: Endocrinology

## 2023-10-30 VITALS — Ht 66.5 in | Wt 296.0 lb

## 2023-10-30 VITALS — BP 126/82 | HR 81 | Resp 20 | Ht 66.5 in | Wt 306.0 lb

## 2023-10-30 DIAGNOSIS — E039 Hypothyroidism, unspecified: Secondary | ICD-10-CM

## 2023-10-30 DIAGNOSIS — E1165 Type 2 diabetes mellitus with hyperglycemia: Secondary | ICD-10-CM | POA: Diagnosis not present

## 2023-10-30 DIAGNOSIS — Z7984 Long term (current) use of oral hypoglycemic drugs: Secondary | ICD-10-CM

## 2023-10-30 DIAGNOSIS — Z7985 Long-term (current) use of injectable non-insulin antidiabetic drugs: Secondary | ICD-10-CM

## 2023-10-30 DIAGNOSIS — E119 Type 2 diabetes mellitus without complications: Secondary | ICD-10-CM | POA: Insufficient documentation

## 2023-10-30 DIAGNOSIS — E782 Mixed hyperlipidemia: Secondary | ICD-10-CM

## 2023-10-30 DIAGNOSIS — E669 Obesity, unspecified: Secondary | ICD-10-CM | POA: Diagnosis present

## 2023-10-30 LAB — POCT GLYCOSYLATED HEMOGLOBIN (HGB A1C): Hemoglobin A1C: 6.5 % — AB (ref 4.0–5.6)

## 2023-10-30 LAB — T4, FREE: Free T4: 1.4 ng/dL (ref 0.8–1.8)

## 2023-10-30 LAB — TSH: TSH: 0.51 m[IU]/L

## 2023-10-30 MED ORDER — TIRZEPATIDE 15 MG/0.5ML ~~LOC~~ SOAJ: 15.0000 mg | SUBCUTANEOUS | 4 refills | Status: AC

## 2023-10-30 MED ORDER — ATORVASTATIN CALCIUM 20 MG PO TABS: 20.0000 mg | ORAL_TABLET | Freq: Every day | ORAL | 3 refills | Status: AC

## 2023-10-30 NOTE — Progress Notes (Signed)
 Outpatient Endocrinology Note [place] Georgetta Crafton, MD   Patient's Name: Joan Walker    DOB: 16-Jun-1975    MRN: [id number]                                                    REASON OF VISIT: Follow up for type 2 diabetes mellitus / hypothyroidism  PCP: Leonel Cole, MD  HISTORY OF PRESENT ILLNESS:   Joan Walker is a 48 y.o. old female with past medical history listed below, is here for follow up for type 2 diabetes mellitus / hypothyroidism.   Pertinent Diabetes History: Patient was previously seen by Dr. Von and was last time seen in June 2024.  Patient was diagnosed with type 2 diabetes mellitus in July 2020.  Baseline A1c was 10.4%.    Chronic Diabetes Complications : Retinopathy: yes. Last ophthalmology exam was done on annually, 01/19/2023, following with ophthalmology regularly.  No records available. Nephropathy: no, on ACE/ARB /lisinopril . Peripheral neuropathy: no Coronary artery disease: no Stroke: no  Relevant comorbidities and cardiovascular risk factors: Obesity: yes Body mass index is 48.65 kg/m.  Hypertension: Yes  Hyperlipidemia : Yes, on statin   Current / Home Diabetic regimen includes:  Metformin  1000 mg 2 times a day. Mounjaro  12.5 mg weekly. Glimepiride  2 mg daily.  Currently not taking from hospitalization in August.  Prior diabetic medications:  Glycemic data:   She has test supplies and glucometer however has not been checking blood sugar at home, no glucose data to review.  Hypoglycemia: Patient has no hypoglycemic episodes. Patient has hypoglycemia awareness.  Factors modifying glucose control: 1.  Diabetic diet assessment: 3 meals a day.  2.  Staying active or exercising: No formal exercise.  3.  Medication compliance: compliant all of the time.  # Primary hypothyroidism: - First diagnosed around 2015.  Levothyroxine  dose was periodically adjusted over time.  She had been up to 300 mcg levothyroxine  in the past.  Lately she has been  taking levothyroxine  200 mcg daily.  Interval history  Patient was hospitalized in August due to urosepsis from impacted stone.  She recovered.  After hospital discharge patient reports she has not been taking glimepiride .  Hemoglobin A1c today 6.5%.  She has been taking levothyroxine  200 mg daily.  In August she had mildly elevated free T4 with normal TSH, while she was hospitalized.  She is overall feeling good.  No palpitation.  No other complaints today.  REVIEW OF SYSTEMS As per history of present illness.   PAST MEDICAL HISTORY: Past Medical History:  Diagnosis Date   Arthritis    Asthma    DM2 (diabetes mellitus, type 2) (HCC)    GERD (gastroesophageal reflux disease)    History of kidney stones    Hypercholesteremia    Hypothyroidism    Morbid obesity (HCC)     PAST SURGICAL HISTORY: Past Surgical History:  Procedure Laterality Date   CHOLECYSTECTOMY     COLONOSCOPY WITH PROPOFOL  N/A 11/25/2022   Procedure: COLONOSCOPY WITH PROPOFOL ;  Surgeon: Saintclair Jasper, MD;  Location: [facility];  Service: Gastroenterology;  Laterality: N/A;   CYSTOSCOPY W/ URETERAL STENT PLACEMENT Left 12/19/2021   Procedure: CYSTOSCOPY WITH POSSIBLE LEFT RETROGRADE PYELOGRAM/URETERAL  AND   STENT PLACEMENT;  Surgeon: Watt Rush, MD;  Location: [facility];  Service: Urology;  Laterality: Left;   CYSTOSCOPY  W/ URETERAL STENT PLACEMENT Left 09/07/2023   Procedure: CYSTOSCOPY, WITH RETROGRADE PYELOGRAM AND URETERAL STENT INSERTION;  Surgeon: Sherrilee Belvie CROME, MD;  Location: [facility] OR;  Service: Urology;  Laterality: Left;  CYSTOSCOPY WITH LEFT RETROGRADE STENT PLACEMENT   CYSTOSCOPY/URETEROSCOPY/HOLMIUM LASER/STENT PLACEMENT Left 01/16/2022   Procedure: CYSTOSCOPY LEF TURETEROSCOPY/HOLMIUM LASER/STENT PLACEMENT;  Surgeon: Watt Rush, MD;  Location: [facility];  Service: Urology;  Laterality: Left;   POLYPECTOMY  11/25/2022   Procedure: POLYPECTOMY;  Surgeon: Saintclair Jasper, MD;  Location: [facility];  Service:  Gastroenterology;;   WISDOM TOOTH EXTRACTION      ALLERGIES: Allergies  Allergen Reactions   Bee Pollen Swelling    FAMILY HISTORY:  Family History  Problem Relation Age of Onset   Bladder Cancer Mother        67s   Prostate cancer Maternal Uncle    Lung cancer Paternal Aunt    Lung cancer Paternal Grandmother     SOCIAL HISTORY: Social History   Socioeconomic History   Marital status: Married    Spouse name: Not on file   Number of children: Not on file   Years of education: Not on file   Highest education level: Not on file  Occupational History   Not on file  Tobacco Use   Smoking status: Former    Current packs/day: 0.00    Types: Cigarettes    Quit date: 01/20/2004    Years since quitting: 19.7   Smokeless tobacco: Never  Vaping Use   Vaping status: Never Used  Substance and Sexual Activity   Alcohol use: No   Drug use: No   Sexual activity: Not Currently  Other Topics Concern   Not on file  Social History Narrative   Not on file   Social Drivers of Health   Financial Resource Strain: Not on file  Food Insecurity: Patient Unable To Answer (09/08/2023)   Hunger Vital Sign    Worried About Running Out of Food in the Last Year: Patient unable to answer    Ran Out of Food in the Last Year: Patient unable to answer  Transportation Needs: Patient Declined (09/08/2023)   PRAPARE - Administrator, Civil Service (Medical): Patient declined    Lack of Transportation (Non-Medical): Patient declined  Physical Activity: Not on file  Stress: Not on file  Social Connections: Not on file    MEDICATIONS:  Current Outpatient Medications  Medication Sig Dispense Refill   levothyroxine  (SYNTHROID ) 200 MCG tablet Take 1 tablet (200 mcg total) by mouth daily before breakfast. 1 TABLET BEFORE BREAKFAST DAILY EXCEPT HALF TABLET ON SUNDAYS (Patient taking differently: Take 200 mcg by mouth daily.) 90 tablet 3   metFORMIN  (GLUCOPHAGE ) 500 MG tablet TAKE 3  TABLETS (1,500 MG TOTAL) BY MOUTH DAILY 270 tablet 2   tirzepatide  (MOUNJARO ) 15 MG/0.5ML Pen Inject 15 mg into the skin once a week. 6 mL 4   acetaminophen  (TYLENOL ) 500 MG tablet Take 1,000 mg by mouth every 6 (six) hours as needed for fever or moderate pain. (Patient not taking: Reported on 10/30/2023)     albuterol  (VENTOLIN  HFA) 108 (90 Base) MCG/ACT inhaler Inhale 2 puffs into the lungs as needed for wheezing or shortness of breath. (Patient not taking: Reported on 10/30/2023)     atorvastatin  (LIPITOR) 20 MG tablet Take 1 tablet (20 mg total) by mouth daily. 90 tablet 3   CARBOXYMETHYLCELLUL-GLYCERIN OP Place 1 drop into both eyes as needed (dry eyes). (Patient not taking: Reported on 10/30/2023)  cefadroxil  (DURICEF) 500 MG capsule Take 2 capsules (1,000 mg total) by mouth 2 (two) times daily. (Patient not taking: Reported on 10/30/2023) 14 capsule 0   famotidine  (PEPCID ) 20 MG tablet Take 20 mg by mouth 2 (two) times daily as needed for heartburn or indigestion. (Patient not taking: Reported on 10/30/2023)     fluticasone (FLONASE) 50 MCG/ACT nasal spray Place 1 spray into both nostrils as needed for allergies. (Patient not taking: Reported on 10/30/2023)     [Paused] indapamide (LOZOL) 1.25 MG tablet Take 1.25 mg by mouth daily. (Patient not taking: Reported on 10/30/2023)     [Paused] lisinopril  (ZESTRIL ) 20 MG tablet Take 20 mg by mouth every evening. (Patient not taking: Reported on 10/30/2023)     magnesium  oxide (MAG-OX) 400 (240 Mg) MG tablet Take 400 mg by mouth at bedtime. (Patient not taking: Reported on 10/30/2023)     oxybutynin  (DITROPAN ) 5 MG tablet Take 1 tablet (5 mg total) by mouth every 8 (eight) hours as needed for bladder spasms. (Patient not taking: Reported on 10/30/2023) 30 tablet 1   No current facility-administered medications for this visit.    PHYSICAL EXAM: Vitals:   10/30/23 0941  BP: 126/82  Pulse: 81  Resp: 20  SpO2: 99%  Weight: (!) 306 lb (138.8  kg)  Height: 5' 6.5 (1.689 m)    Body mass index is 48.65 kg/m.  Wt Readings from Last 3 Encounters:  10/30/23 (!) 306 lb (138.8 kg)  10/22/23 (!) 303 lb 11.2 oz (137.8 kg)  10/12/23 297 lb 1.6 oz (134.8 kg)    General: Well developed, well nourished female in no apparent distress.  HEENT: AT/[place], no external lesions.  Eyes: Conjunctiva clear and no icterus. Neck: Neck supple  Lungs: Respirations not labored Neurologic: Alert, oriented, normal speech Extremities / Skin: Dry.  Psychiatric: Does not appear depressed or anxious  Diabetic Foot Exam - Simple   No data filed    LABS Reviewed Lab Results  Component Value Date   HGBA1C 6.5 (A) 10/30/2023   HGBA1C 6.2 (A) 07/16/2023   HGBA1C 7.5 (H) 04/16/2023   Lab Results  Component Value Date   FRUCTOSAMINE 227 03/18/2019   FRUCTOSAMINE 250 11/12/2018   Lab Results  Component Value Date   CHOL 159 01/16/2023   HDL 42 (L) 01/16/2023   LDLCALC 90 01/16/2023   LDLDIRECT 104.0 04/14/2022   TRIG 174 (H) 01/16/2023   CHOLHDL 3.8 01/16/2023   Lab Results  Component Value Date   MICRALBCREAT 6 04/16/2023   Lab Results  Component Value Date   CREATININE 1.06 (H) 09/11/2023   Lab Results  Component Value Date   GFR 89.09 07/04/2022    ASSESSMENT / PLAN  1. Uncontrolled type 2 diabetes mellitus with hyperglycemia, without long-term current use of insulin  (HCC)   2. Adult hypothyroidism   3. Mixed hyperlipidemia     Diabetes Mellitus type 2, complicated by diabetic retinopathy. - Diabetic status / severity: controlled.   Lab Results  Component Value Date   HGBA1C 6.5 (A) 10/30/2023    - Hemoglobin A1c goal : <6.5%  She has fair control of diabetes mellitus.  Adjusted diabetes regimen as follows.  - Medications: See below.  I) increase Mounjaro  from 12.5 to 15 mg weekly. II) continue metformin  1000 mg 2 times a day. Stop glimepiride , do not restart.  - Home glucose testing: Advised to check blood  sugar in the morning fasting and at bedtime.  - Discussed/ Gave Hypoglycemia treatment plan.  #  Consult : not required at this time.   # Annual urine for microalbuminuria/ creatinine ratio, no microalbuminuria currently, continue ACE/ARB /lisinopril .  Normal urine microalbumin creatinine ratio recently. Last  Lab Results  Component Value Date   MICRALBCREAT 6 04/16/2023    # Foot check nightly.  # She has diabetic retinopathy, no records available, regular follow-up with ophthalmology.  Asked to send eye exam record to our clinic.   - Diet: Make healthy diabetic food choices - Life style / activity / exercise: Discussed.  2. Blood pressure  -  BP Readings from Last 1 Encounters:  10/30/23 126/82    - Control is in target.  - No change in current plans.  3. Lipid status / Hyperlipidemia - Last  Lab Results  Component Value Date   LDLCALC 90 01/16/2023   - Continue atorvastatin  20 mg daily. - Medication renewed.  # Primary hypothyroidism -She is currently taking levothyroxine  200 mcg daily.   -She had abnormal thyroid  function test in August however she was hospitalized due to severe illness. -Will check thyroid  function test today.  # Morbid obesity BMI 53, improved to 48. -Discussed about lifestyle modification with calorie restriction, carbohydrate limitation and routine formal exercise.  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 -She has been following with bariatric surgery. - Mounjaro  would be helpful for weight loss as well.  Diagnoses and all orders for this visit:  Uncontrolled type 2 diabetes mellitus with hyperglycemia, without long-term current use of insulin  (HCC) -     POCT glycosylated hemoglobin (Hb A1C) -     tirzepatide  (MOUNJARO ) 15 MG/0.5ML Pen; Inject 15 mg into the skin once a week.  Adult hypothyroidism -     T4, free -     TSH  Mixed hyperlipidemia -     atorvastatin   (LIPITOR) 20 MG tablet; Take 1 tablet (20 mg total) by mouth daily.    DISPOSITION Follow up in clinic in 3  months suggested.  Labs today as ordered.  All questions answered and patient verbalized understanding of the plan.  [place] Glee Lashomb, MD [facility] End[facility] [address], [address] [place], [place] [address] Phone # [phone]  At least part of this note was generated using voice recognition software. Inadvertent word errors may have occurred, which were not recognized during the proofreading process.

## 2023-10-30 NOTE — Progress Notes (Signed)
 Supervised Weight Loss Visit Bariatric Nutrition Education Appt Start Time: 1011 End Time: 1027  Planned surgery: RYGB Pt expectation of surgery: to be about 180 lbs  Referral stated Supervised Weight Loss (SWL) visits needed: 12  11 out of 12 SWL Appointments   NUTRITION ASSESSMENT   Anthropometrics  Start weight at [facility]: 320.6 lbs (date: 05/21/2023)  Height: 66.5 in Weight today: 296.0 lb BMI: 47.06 kg/m   Clinical   Pharmacotherapy: History of weight loss medication used: Mounjaro  (for diabetes)  Medical hx: T2DM, hypercholesterolemia, obesity; kidney stones Medications: Mounjaro , glimepiride , indapamide, oxybutynin , levothyroxine , metformin , lisinopril , atorvastatin  Labs: A1C 6.2, creatinine 1.06, calcium  8.1, HCT 28.4, hemoglobin 9.2, RBC 3.33 Notable signs/symptoms: none noted Any previous deficiencies? No  Lifestyle & Dietary Hx  Pt states one day she had 60 grams of protein just for lunch, stating she knows she needs to spread out the protein throughout the day. [place] stating she has a lot and is going to dehydrate them to be able to store them. Pt states she stocked up on yogurt for snacks.  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  24-Hr Dietary Recall First Meal: egg with keto toast or toast and peanut butter or cereal or oatmeal Snack:  Second Meal: skip or leftovers (lasagna or spinach/antichoke dip) or peanut butter jelly sandwich or pistachios and cole slaw or [employer] or school wings or cole slaw Snack: no Third Meal: macaroni and cheese or bunless hamburger with potatoes and carrots and onion or BLT Snack: triscuits Beverages: water ,  Gatorade zero  Estimated Energy Needs Calories: 1500  NUTRITION DIAGNOSIS  Overweight/obesity ([place]-3.3) related to past poor dietary habits and physical inactivity as evidenced by patient w/ planned RYGB surgery following dietary guidelines for continued weight loss.  NUTRITION INTERVENTION  Nutrition counseling (C-1) and education (E-2) to facilitate bariatric surgery goals.  Pre-Op Goals Reviewed with the Patient  Reiterated: Using the Bariatric MyPlate as a guide is a great way to increase non-starchy vegetables while supporting post-op nutrition goals. The plate emphasizes prioritizing protein first, but the next largest portion should be non-starchy vegetables like leafy greens, cucumbers, bell peppers, zucchini, or broccoli. These foods are low in calories and carbohydrates but high in fiber, volume, and nutrients--helping you feel satisfied without overeating. Including them at every meal adds color, crunch, and variety, and helps slow down eating naturally. Pairing small bites of lean protein with bites of vegetables encourages mindful chewing and supports the goal of stopping at satisfaction rather than fullness.  Pre-Op Goals Progress & New Goals Continue: taking vit D, and  regular women's multivitamin Continue: cutting out grazing between meals and at night Continue: schedule meals and snacks; set alarms on phone (set at end of appointment) Continue: practice not drinking with meals; take small bites, chew well, slow down at meal/snack time. Continue: cut out Sprite Continue: increase physical activity; aim for 3 days per week, for 20 minutes per day (5-10 minutes at a time or walking the dog 20 minutes); increase intensity as able Continue: Aim for satisfaction before fullness Re-engage: Use the Bariatric MyPlate for portion control; increase non-starchy vegetables and still have some complex carbohydrates Continue: aim for a protein with every  meal or snack Continue: track  protein; aim for 60 grams per day Continue: drink 80+ fluid ounces daily to reduce risk of kidney stones Continue: check your blood sugars to ensure you are not getting readings below 70  Handouts Provided Include    Learning Style & Readiness for Change Teaching method utilized: Visual & Auditory  Demonstrated degree of understanding via: Teach Back  Readiness Level: preparation Barriers to learning/adherence to lifestyle change: physical activity/ mobility (arthritis)  RD's Notes for next Visit  Patient progress toward chosen goals  MONITORING & EVALUATION Dietary intake, weekly physical activity, body weight, and pre-op goals  Next Steps  Patient is to return to [facility] in 1-2 weeks for next SWL visit.

## 2023-11-02 ENCOUNTER — Encounter: Attending: Surgery | Admitting: Dietician

## 2023-11-02 ENCOUNTER — Encounter: Payer: Self-pay | Admitting: Dietician

## 2023-11-02 VITALS — Ht 66.5 in | Wt 294.0 lb

## 2023-11-02 DIAGNOSIS — E119 Type 2 diabetes mellitus without complications: Secondary | ICD-10-CM | POA: Diagnosis present

## 2023-11-02 DIAGNOSIS — E669 Obesity, unspecified: Secondary | ICD-10-CM | POA: Diagnosis present

## 2023-11-02 NOTE — Progress Notes (Signed)
 Supervised Weight Loss Visit Bariatric Nutrition Education Appt Start Time: 1718 End Time: 1733  Planned surgery: RYGB Pt expectation of surgery: to be about 180 lbs  Referral stated Supervised Weight Loss (SWL) visits needed: 12  12 out of 12 SWL Appointments  Pt completed visits.   Pt has cleared nutrition requirements.   NUTRITION ASSESSMENT   Anthropometrics  Start weight at [facility]: 320.6 lbs (date: 05/21/2023)  Height: 66.5 in Weight today: 294.0 lb BMI: 46.74 kg/m   Clinical   Pharmacotherapy: History of weight loss medication used: Mounjaro  (for diabetes)  Medical hx: T2DM, hypercholesterolemia, obesity; kidney stones Medications: Mounjaro , levothyroxine , metformin , atorvastatin , vit D Labs: A1C 6.2, creatinine 1.06, calcium  8.1, HCT 28.4, hemoglobin 9.2, RBC 3.33 Notable signs/symptoms: none noted Any previous deficiencies? No  Lifestyle & Dietary Hx  Pt states she is doing really well with movement, stating she takes her dog for walks and her co-worker goes on walks with her. Pt states she has done well eating smaller portions in preparation for surgery. Pt states she is getting better eating more vegetables, but states she needs to work on eating more of them to get ready for surgery.  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  24-Hr Dietary Recall First Meal: egg with keto toast or toast and peanut butter or cereal or oatmeal Snack:  Second Meal: skip or leftovers (lasagna or spinach/antichoke dip) or peanut butter jelly sandwich or pistachios and cole slaw or [employer] or school wings or cole slaw Snack: no Third Meal: macaroni and cheese or bunless hamburger with potatoes and carrots and  onion or BLT Snack: triscuits Beverages: water , Gatorade zero  Estimated Energy Needs Calories: 1500  NUTRITION DIAGNOSIS  Overweight/obesity ([place]-3.3) related to past poor dietary habits and physical inactivity as evidenced by patient w/ planned RYGB surgery following dietary guidelines for continued weight loss.  NUTRITION INTERVENTION  Nutrition counseling (C-1) and education (E-2) to facilitate bariatric surgery goals.  Pre-Op Goals Reviewed with the Patient  Pre-Op Goals Progress & New Goals Continue: taking vit D, and  regular women's multivitamin Continue: cutting out grazing between meals and at night Continue: schedule meals and snacks; set alarms on phone (set at end of appointment) Continue: practice not drinking with meals; take small bites, chew well, slow down at meal/snack time. Continue: cut out Sprite Continue: increase physical activity; aim for 3 days per week, for 20 minutes per day (5-10 minutes at a time or walking the dog 20 minutes); increase intensity as able Continue: Aim for satisfaction before fullness Continue: Use the Bariatric MyPlate for portion control; increase non-starchy vegetables and still have some complex carbohydrates Continue: aim for a protein with every meal or snack Continue: track protein; aim for 60 grams per day Continue: drink 80+ fluid ounces daily to reduce risk of kidney stones Continue: check your blood sugars to ensure you are not getting readings below 70  Handouts Provided Include    Learning Style & Readiness for Change Teaching method utilized: Visual & Auditory  Demonstrated degree of understanding via: Teach Back  Readiness Level: preparation Barriers to learning/adherence to lifestyle change: physical activity/ mobility (arthritis)  RD's Notes for next Visit  Patient progress toward chosen goals  MONITORING & EVALUATION Dietary  intake, weekly physical activity, body weight, and pre-op goals  Next Steps  Pt has  completed visits. No further supervised visits required/recommended. Patient is to return to [facility] for pre-op class >2 weeks prior to scheduled surgery.

## 2024-01-04 ENCOUNTER — Encounter: Payer: Self-pay | Admitting: Endocrinology

## 2024-01-04 ENCOUNTER — Other Ambulatory Visit: Payer: Self-pay

## 2024-01-25 ENCOUNTER — Other Ambulatory Visit (HOSPITAL_COMMUNITY): Payer: Self-pay

## 2024-02-08 ENCOUNTER — Ambulatory Visit: Payer: Self-pay | Admitting: Endocrinology

## 2024-02-08 ENCOUNTER — Ambulatory Visit: Admitting: Endocrinology

## 2024-02-08 ENCOUNTER — Encounter: Payer: Self-pay | Admitting: Endocrinology

## 2024-02-08 ENCOUNTER — Other Ambulatory Visit

## 2024-02-08 VITALS — BP 128/89 | HR 78 | Resp 16 | Ht 66.5 in | Wt 274.0 lb

## 2024-02-08 DIAGNOSIS — E782 Mixed hyperlipidemia: Secondary | ICD-10-CM

## 2024-02-08 DIAGNOSIS — Z7984 Long term (current) use of oral hypoglycemic drugs: Secondary | ICD-10-CM

## 2024-02-08 DIAGNOSIS — E118 Type 2 diabetes mellitus with unspecified complications: Secondary | ICD-10-CM | POA: Diagnosis not present

## 2024-02-08 DIAGNOSIS — Z7985 Long-term (current) use of injectable non-insulin antidiabetic drugs: Secondary | ICD-10-CM

## 2024-02-08 DIAGNOSIS — E039 Hypothyroidism, unspecified: Secondary | ICD-10-CM | POA: Diagnosis not present

## 2024-02-08 LAB — POCT GLYCOSYLATED HEMOGLOBIN (HGB A1C): Hemoglobin A1C: 6.4 % — AB (ref 4.0–5.6)

## 2024-02-08 NOTE — Progress Notes (Signed)
 "  Outpatient Endocrinology Note Admiral Marcucci, MD   Patient's Name: Joan Walker    DOB: Nov 06, 1975    MRN: [id number]                                                    REASON OF VISIT: Follow up for type 2 diabetes mellitus / hypothyroidism  PCP: Leonel Cole, MD  HISTORY OF PRESENT ILLNESS:   Joan Walker is a 49 y.o. old female with past medical history listed below, is here for follow up for type 2 diabetes mellitus / hypothyroidism.   Pertinent Diabetes History: Patient was previously seen by Dr. Von and was last time seen in June 2024.  Patient was diagnosed with type 2 diabetes mellitus in July 2020.  Baseline A1c was 10.4%.    Chronic Diabetes Complications : Retinopathy: yes. Last ophthalmology exam was done on annually, 01/19/2023, following with ophthalmology regularly.  No records available. [facility].  Nephropathy: no, on ACE/ARB /lisinopril . Peripheral neuropathy: no Coronary artery disease: no Stroke: no  Relevant comorbidities and cardiovascular risk factors: Obesity: yes Body mass index is 43.56 kg/m.  Hypertension: Yes  Hyperlipidemia : Yes, on statin   Current / Home Diabetic regimen includes:  Metformin  1000 mg 2 times a day. Mounjaro  15 mg weekly.  Prior diabetic medications: Glimepiride  : stopped in August 2025.  Glycemic data:   She has test supplies and glucometer however has not been checking blood sugar at home, no glucose data to review.  Hypoglycemia: Patient has no hypoglycemic episodes. Patient has hypoglycemia awareness.  Factors modifying glucose control: 1.  Diabetic diet assessment: 3 meals a day.  2.  Staying active or exercising: No formal exercise.  3.  Medication compliance: compliant all of the time.  # Primary hypothyroidism: - First diagnosed around 2015.  Levothyroxine  dose was periodically adjusted over time.  She had been up to 300 mcg levothyroxine  in the past.  Lately she has been taking levothyroxine  200 mcg  daily.  Interval history  Hemoglobin A1c 6.4% today.  Diabetes regimen as reviewed and noted above including Mounjaro , tolerating well.  She lost 30 to 35 pounds of weight in last 3 months since the last visit.  Congratulated her.  Denies numbness and ting of the feet.  No vision problem.  No palpitation and heat intolerance.  She has been taking levothyroxine  200 mcg daily.  She has been following with weight management center and plan for bariatric surgery in March.  She brought paperwork to complete in the clinic today, for bariatric surgery.  Completed, and will fax to the metabolic and weight management center, [facility], [place]    REVIEW OF SYSTEMS As per history of present illness.   PAST MEDICAL HISTORY: Past Medical History:  Diagnosis Date   Arthritis    Asthma    DM2 (diabetes mellitus, type 2) (HCC)    GERD (gastroesophageal reflux disease)    History of kidney stones    Hypercholesteremia    Hypothyroidism    Morbid obesity (HCC)     PAST SURGICAL HISTORY: Past Surgical History:  Procedure Laterality Date   CHOLECYSTECTOMY     COLONOSCOPY WITH PROPOFOL  N/A 11/25/2022   Procedure: COLONOSCOPY WITH PROPOFOL ;  Surgeon: Saintclair Jasper, MD;  Location: [facility];  Service: Gastroenterology;  Laterality: N/A;   CYSTOSCOPY W/  URETERAL STENT PLACEMENT Left 12/19/2021   Procedure: CYSTOSCOPY WITH POSSIBLE LEFT RETROGRADE PYELOGRAM/URETERAL  AND   STENT PLACEMENT;  Surgeon: Watt Rush, MD;  Location: [facility] OR;  Service: Urology;  Laterality: Left;   CYSTOSCOPY W/ URETERAL STENT PLACEMENT Left 09/07/2023   Procedure: CYSTOSCOPY, WITH RETROGRADE PYELOGRAM AND URETERAL STENT INSERTION;  Surgeon: Sherrilee Belvie CROME, MD;  Location: [facility] OR;  Service: Urology;  Laterality: Left;  CYSTOSCOPY WITH LEFT RETROGRADE STENT PLACEMENT   CYSTOSCOPY/URETEROSCOPY/HOLMIUM LASER/STENT PLACEMENT Left 01/16/2022   Procedure: CYSTOSCOPY LEF TURETEROSCOPY/HOLMIUM LASER/STENT PLACEMENT;   Surgeon: Watt Rush, MD;  Location: [facility];  Service: Urology;  Laterality: Left;   POLYPECTOMY  11/25/2022   Procedure: POLYPECTOMY;  Surgeon: Saintclair Jasper, MD;  Location: [facility];  Service: Gastroenterology;;   WISDOM TOOTH EXTRACTION      ALLERGIES: Allergies  Allergen Reactions   Bee Pollen Swelling    FAMILY HISTORY:  Family History  Problem Relation Age of Onset   Bladder Cancer Mother        47s   Prostate cancer Maternal Uncle    Lung cancer Paternal Aunt    Lung cancer Paternal Grandmother     SOCIAL HISTORY: Social History   Socioeconomic History   Marital status: Married    Spouse name: Not on file   Number of children: Not on file   Years of education: Not on file   Highest education level: Not on file  Occupational History   Not on file  Tobacco Use   Smoking status: Former    Current packs/day: 0.00    Types: Cigarettes    Quit date: 01/20/2004    Years since quitting: 20.0   Smokeless tobacco: Never  Vaping Use   Vaping status: Never Used  Substance and Sexual Activity   Alcohol use: No   Drug use: No   Sexual activity: Not Currently  Other Topics Concern   Not on file  Social History Narrative   Not on file   Social Drivers of Health   Tobacco Use: Medium Risk (02/08/2024)   Patient History    Smoking Tobacco Use: Former    Smokeless Tobacco Use: Never    Passive Exposure: Not on Actuary Strain: Not on file  Food Insecurity: Patient Unable To Answer (09/08/2023)   Epic    Worried About Programme Researcher, Broadcasting/film/video in the Last Year: Patient unable to answer    Ran Out of Food in the Last Year: Patient unable to answer  Transportation Needs: Patient Declined (09/08/2023)   Epic    Lack of Transportation (Medical): Patient declined    Lack of Transportation (Non-Medical): Patient declined  Physical Activity: Not on file  Stress: Not on file  Social Connections: Not on file  Depression (PHQ2-9): Low Risk (05/21/2023)    Depression (PHQ2-9)    PHQ-2 Score: 0  Alcohol Screen: Not on file  Housing: Patient Declined (09/08/2023)   Epic    Unable to Pay for Housing in the Last Year: Patient declined    Number of Times Moved in the Last Year: Not on file    Homeless in the Last Year: Patient declined  Utilities: Patient Declined (09/08/2023)   Epic    Threatened with loss of utilities: Patient declined  Health Literacy: Not on file    MEDICATIONS:  Current Outpatient Medications  Medication Sig Dispense Refill   acetaminophen  (TYLENOL ) 500 MG tablet Take 1,000 mg by mouth every 6 (six) hours as needed for fever or moderate  pain.     albuterol  (VENTOLIN  HFA) 108 (90 Base) MCG/ACT inhaler Inhale 2 puffs into the lungs as needed for wheezing or shortness of breath.     atorvastatin  (LIPITOR) 20 MG tablet Take 1 tablet (20 mg total) by mouth daily. 90 tablet 3   CARBOXYMETHYLCELLUL-GLYCERIN OP Place 1 drop into both eyes as needed (dry eyes).     cefadroxil  (DURICEF) 500 MG capsule Take 2 capsules (1,000 mg total) by mouth 2 (two) times daily. 14 capsule 0   famotidine  (PEPCID ) 20 MG tablet Take 20 mg by mouth 2 (two) times daily as needed for heartburn or indigestion.     fluticasone (FLONASE) 50 MCG/ACT nasal spray Place 1 spray into both nostrils as needed for allergies.     [Paused] indapamide (LOZOL) 1.25 MG tablet Take 1.25 mg by mouth daily.     levothyroxine  (SYNTHROID ) 200 MCG tablet Take 1 tablet (200 mcg total) by mouth daily before breakfast. 1 TABLET BEFORE BREAKFAST DAILY EXCEPT HALF TABLET ON SUNDAYS (Patient taking differently: Take 200 mcg by mouth daily.) 90 tablet 3   [Paused] lisinopril  (ZESTRIL ) 20 MG tablet Take 20 mg by mouth every evening.     magnesium  oxide (MAG-OX) 400 (240 Mg) MG tablet Take 400 mg by mouth at bedtime.     metFORMIN  (GLUCOPHAGE ) 500 MG tablet TAKE 3 TABLETS (1,500 MG TOTAL) BY MOUTH DAILY (Patient taking differently: 1,000 mg in the morning and at bedtime.) 270 tablet 2    oxybutynin  (DITROPAN ) 5 MG tablet Take 1 tablet (5 mg total) by mouth every 8 (eight) hours as needed for bladder spasms. 30 tablet 1   tirzepatide  (MOUNJARO ) 15 MG/0.5ML Pen Inject 15 mg into the skin once a week. 6 mL 4   No current facility-administered medications for this visit.    PHYSICAL EXAM: Vitals:   02/08/24 0854  BP: 128/89  Pulse: 78  Resp: 16  SpO2: 99%  Weight: 274 lb (124.3 kg)  Height: 5' 6.5 (1.689 m)    Body mass index is 43.56 kg/m.  Wt Readings from Last 3 Encounters:  02/08/24 274 lb (124.3 kg)  11/02/23 294 lb (133.4 kg)  10/30/23 296 lb (134.3 kg)    General: Well developed, well nourished female in no apparent distress.  HEENT: AT/[place], no external lesions.  Eyes: Conjunctiva clear and no icterus. Neck: Neck supple  Lungs: Respirations not labored Neurologic: Alert, oriented, normal speech Extremities / Skin: Dry.  Psychiatric: Does not appear depressed or anxious  Diabetic Foot Exam - Simple   Simple Foot Form Diabetic Foot exam was performed with the following findings: Yes 02/08/2024  9:18 AM  Visual Inspection No deformities, no ulcerations, no other skin breakdown bilaterally: Yes Sensation Testing Intact to touch and monofilament testing bilaterally: Yes Pulse Check Posterior Tibialis and Dorsalis pulse intact bilaterally: Yes Comments    LABS Reviewed Lab Results  Component Value Date   HGBA1C 6.4 (A) 02/08/2024   HGBA1C 6.5 (A) 10/30/2023   HGBA1C 6.2 (A) 07/16/2023   Lab Results  Component Value Date   FRUCTOSAMINE 227 03/18/2019   FRUCTOSAMINE 250 11/12/2018   Lab Results  Component Value Date   CHOL 159 01/16/2023   HDL 42 (L) 01/16/2023   LDLCALC 90 01/16/2023   LDLDIRECT 104.0 04/14/2022   TRIG 174 (H) 01/16/2023   CHOLHDL 3.8 01/16/2023   Lab Results  Component Value Date   MICRALBCREAT 6 04/16/2023   Lab Results  Component Value Date   CREATININE 1.06 (H) 09/11/2023  Lab Results  Component Value Date    GFR 89.09 07/04/2022    ASSESSMENT / PLAN  1. Controlled diabetes mellitus type 2 with complications (HCC)   2. Adult hypothyroidism   3. Mixed hyperlipidemia     Diabetes Mellitus type 2, complicated by diabetic retinopathy. - Diabetic status / severity: controlled.   Lab Results  Component Value Date   HGBA1C 6.4 (A) 02/08/2024    - Hemoglobin A1c goal : <6.5%  She has fair control of diabetes mellitus.  On current regimen.  - Medications: See below.  I) continue Mounjaro  15 mg weekly. II) continue metformin  1000 mg 2 times a day.  - Home glucose testing: Advised to check blood sugar in the morning fasting and at bedtime.  - Discussed/ Gave Hypoglycemia treatment plan.  # Consult : not required at this time.   # Annual urine for microalbuminuria/ creatinine ratio, no microalbuminuria currently, continue ACE/ARB /lisinopril .  Last  Lab Results  Component Value Date   MICRALBCREAT 6 04/16/2023    # Foot check nightly.  # She has diabetic retinopathy, no records available, regular follow-up with ophthalmology.  Asked to send eye exam record to our clinic.   - Diet: Make healthy diabetic food choices - Life style / activity / exercise: Discussed.  2. Blood pressure  -  BP Readings from Last 1 Encounters:  02/08/24 128/89    - Control is in target.  - No change in current plans.  3. Lipid status / Hyperlipidemia - Last  Lab Results  Component Value Date   LDLCALC 90 01/16/2023   - Continue atorvastatin  20 mg daily. - Check lipid panel today.  # Primary hypothyroidism -She is currently taking levothyroxine  200 mcg daily.   - Will recheck thyroid  function test TSH, free T4 today.  She lost about 30 to 30 pounds of weight in last 3 months.  # Morbid obesity BMI 53, improved to 48, improved to 43 -Discussed about lifestyle modification with calorie restriction, carbohydrate limitation and routine formal exercise.   -She has been following with  bariatric surgery. -She reports she has plan for bariatric surgery in March at metabolic and weight management center, [facility], [place]  - Clearance from diabetes perspective, weight history and letter of medical necessity forms completed, will be faxed.  Diagnoses and all orders for this visit:  Controlled diabetes mellitus type 2 with complications (HCC) -     POCT glycosylated hemoglobin (Hb A1C) -     Basic metabolic panel with GFR -     Microalbumin / creatinine urine ratio -     Lipid panel  Adult hypothyroidism -     TSH -     T4, free  Mixed hyperlipidemia -     Lipid panel    DISPOSITION Follow up in clinic in 3  months suggested.  Labs today as ordered.  All questions answered and patient verbalized understanding of the plan.  Cherrill Scrima, MD [facility] [address] [place], [address] [place], [place] [address] Phone # [phone]  At least part of this note was generated using voice recognition software. Inadvertent word errors may have occurred, which were not recognized during the proofreading process. "

## 2024-02-09 LAB — LIPID PANEL
Cholesterol: 117 mg/dL
HDL: 34 mg/dL — ABNORMAL LOW
LDL Cholesterol (Calc): 64 mg/dL
Non-HDL Cholesterol (Calc): 83 mg/dL
Total CHOL/HDL Ratio: 3.4 (calc)
Triglycerides: 102 mg/dL

## 2024-02-09 LAB — BASIC METABOLIC PANEL WITH GFR
BUN: 18 mg/dL (ref 7–25)
CO2: 27 mmol/L (ref 20–32)
Calcium: 9.2 mg/dL (ref 8.6–10.2)
Chloride: 104 mmol/L (ref 98–110)
Creat: 0.71 mg/dL (ref 0.50–0.99)
Glucose, Bld: 118 mg/dL — ABNORMAL HIGH (ref 65–99)
Potassium: 4.4 mmol/L (ref 3.5–5.3)
Sodium: 140 mmol/L (ref 135–146)
eGFR: 104 mL/min/1.73m2

## 2024-02-09 LAB — T4, FREE: Free T4: 1.8 ng/dL (ref 0.8–1.8)

## 2024-02-09 LAB — MICROALBUMIN / CREATININE URINE RATIO
Creatinine, Urine: 130 mg/dL (ref 20–275)
Microalb Creat Ratio: 9 mg/g{creat}
Microalb, Ur: 1.2 mg/dL

## 2024-02-09 LAB — TSH: TSH: 0.36 m[IU]/L — ABNORMAL LOW

## 2024-02-29 ENCOUNTER — Encounter

## 2024-07-01 ENCOUNTER — Ambulatory Visit: Admitting: Endocrinology
